# Patient Record
Sex: Male | Born: 1965 | Race: White | Hispanic: No | Marital: Single | Smoking: Former smoker
Health system: Southern US, Community
[De-identification: ages and names within clinical notes are randomized; demographics above are authoritative.]

## PROBLEM LIST (undated history)

## (undated) DIAGNOSIS — I517 Cardiomegaly: Secondary | ICD-10-CM

## (undated) DIAGNOSIS — C760 Malignant neoplasm of head, face and neck: Secondary | ICD-10-CM

## (undated) DIAGNOSIS — J45909 Unspecified asthma, uncomplicated: Secondary | ICD-10-CM

## (undated) DIAGNOSIS — B019 Varicella without complication: Secondary | ICD-10-CM

## (undated) HISTORY — PX: TONSILLECTOMY: SUR1361

## (undated) HISTORY — DX: Unspecified asthma, uncomplicated: J45.909

## (undated) HISTORY — DX: Varicella without complication: B01.9

---

## 1988-08-30 HISTORY — PX: RHINOPLASTY: SUR1284

## 2003-08-31 HISTORY — PX: OTHER SURGICAL HISTORY: SHX169

## 2006-07-24 ENCOUNTER — Inpatient Hospital Stay: Payer: Self-pay | Admitting: Surgery

## 2006-08-30 HISTORY — PX: APPENDECTOMY: SHX54

## 2012-06-22 DIAGNOSIS — J45909 Unspecified asthma, uncomplicated: Secondary | ICD-10-CM | POA: Insufficient documentation

## 2014-01-08 DIAGNOSIS — D509 Iron deficiency anemia, unspecified: Secondary | ICD-10-CM | POA: Insufficient documentation

## 2014-05-07 LAB — TESTOSTERONE: Testosterone: 409

## 2014-12-16 DIAGNOSIS — N400 Enlarged prostate without lower urinary tract symptoms: Secondary | ICD-10-CM | POA: Insufficient documentation

## 2014-12-31 ENCOUNTER — Other Ambulatory Visit: Payer: Self-pay

## 2015-01-07 ENCOUNTER — Ambulatory Visit: Payer: Self-pay

## 2015-01-07 DIAGNOSIS — E291 Testicular hypofunction: Secondary | ICD-10-CM | POA: Insufficient documentation

## 2015-01-07 LAB — HEPATIC FUNCTION PANEL
ALT: 34 U/L (ref 10–40)
AST: 35 U/L (ref 14–40)
Alkaline Phosphatase: 74 U/L (ref 25–125)
BILIRUBIN, TOTAL: 0.4 mg/dL

## 2015-01-07 LAB — BASIC METABOLIC PANEL
BUN: 13 mg/dL (ref 4–21)
Creatinine: 1 mg/dL (ref 0.6–1.3)
Glucose: 100 mg/dL
Potassium: 4.6 mmol/L (ref 3.4–5.3)
SODIUM: 138 mmol/L (ref 137–147)

## 2015-01-07 LAB — TSH: TSH: 2.04 u[IU]/mL (ref <=5.90)

## 2015-01-07 LAB — LIPID PANEL
Cholesterol: 196 mg/dL (ref 0–200)
HDL: 51 mg/dL (ref 35–70)
LDL CALC: 122 mg/dL
Triglycerides: 114 mg/dL (ref 40–160)

## 2015-01-07 LAB — CBC AND DIFFERENTIAL
HCT: 46 % (ref 41–53)
Hemoglobin: 16.2 g/dL (ref 13.5–17.5)
Neutrophils Absolute: 6 /uL
Platelets: 301 10*3/uL (ref 150–399)
WBC: 9.6 10^3/mL

## 2015-09-24 LAB — TESTOSTERONE: Testosterone: 143

## 2015-09-24 LAB — HEMATOCRIT: HCT: 44 %

## 2015-11-20 DIAGNOSIS — J45909 Unspecified asthma, uncomplicated: Secondary | ICD-10-CM

## 2015-11-20 DIAGNOSIS — E291 Testicular hypofunction: Secondary | ICD-10-CM

## 2015-11-20 DIAGNOSIS — D509 Iron deficiency anemia, unspecified: Secondary | ICD-10-CM

## 2015-12-19 LAB — TESTOSTERONE: Testosterone: 842

## 2015-12-31 ENCOUNTER — Telehealth: Payer: Self-pay

## 2015-12-31 NOTE — Telephone Encounter (Signed)
Called patient to reschedule his 01/07/16 appointment with Dr. Mable Fill. I SW with a gentleman that answered the phone but did not give his name. I asked him to have Philip Cordova call the clinic.

## 2016-01-05 NOTE — Telephone Encounter (Signed)
Called patient LVM about 5/10 reschedule appt.

## 2016-01-07 ENCOUNTER — Ambulatory Visit: Payer: Self-pay | Admitting: Internal Medicine

## 2016-01-13 ENCOUNTER — Other Ambulatory Visit: Payer: Self-pay

## 2016-01-15 ENCOUNTER — Ambulatory Visit: Payer: Self-pay

## 2016-01-20 ENCOUNTER — Ambulatory Visit: Payer: Self-pay

## 2016-03-30 ENCOUNTER — Ambulatory Visit: Payer: Self-pay | Admitting: Urology

## 2016-03-30 VITALS — BP 128/80 | HR 67 | Wt 205.0 lb

## 2016-03-30 DIAGNOSIS — R591 Generalized enlarged lymph nodes: Secondary | ICD-10-CM

## 2016-03-30 MED ORDER — AMOXICILLIN-POT CLAVULANATE 875-125 MG PO TABS: 1.0000 | ORAL_TABLET | Freq: Two times a day (BID) | ORAL | 0 refills | Status: DC

## 2016-03-30 MED ORDER — ACYCLOVIR 400 MG PO TABS: 400.0000 mg | ORAL_TABLET | ORAL | 0 refills | Status: DC | PRN

## 2016-03-30 NOTE — Progress Notes (Signed)
  Patient: Philip Cordova Male    DOB: 01-12-1966   50 y.o.   MRN: [id number] Visit Date: 03/30/2016  Today's Provider: [place] [facility]   Chief Complaint  Patient presents with  . Follow-up  . Adenopathy    noticed for a couple weeks   Subjective:    HPI Needs refill on acyclovir.        Allergies  Allergen Reactions  . Milk-Related Compounds    Previous Medications   ACYCLOVIR (ZOVIRAX) 400 MG TABLET    Take 400 mg by mouth as needed.   FLUTICASONE (FLONASE) 50 MCG/ACT NASAL SPRAY    Place 1 spray into both nostrils 2 (two) times daily.   FLUTICASONE-SALMETEROL (ADVAIR) 250-50 MCG/DOSE AEPB    Inhale 1 puff into the lungs 2 (two) times daily.    Review of Systems  Social History  Substance Use Topics  . Smoking status: Current Every Day Smoker    Years: 1.00  . Smokeless tobacco: Not on file  . Alcohol use No     Comment: Occasionally   Objective:   BP 128/80   Pulse 67   Wt 205 lb (93 kg)   SpO2 98%   Physical Exam      Assessment & Plan:    1. Lymphadenopathy  -start antibiotic amoxicillin  -order CT of neck in anticipation of node not resolving       [facility]

## 2016-04-07 ENCOUNTER — Other Ambulatory Visit: Payer: Self-pay

## 2016-04-09 DIAGNOSIS — C4442 Squamous cell carcinoma of skin of scalp and neck: Secondary | ICD-10-CM

## 2016-04-09 DIAGNOSIS — C760 Malignant neoplasm of head, face and neck: Secondary | ICD-10-CM

## 2016-04-09 HISTORY — DX: Squamous cell carcinoma of skin of scalp and neck: C44.42

## 2016-04-09 HISTORY — DX: Malignant neoplasm of head, face and neck: C76.0

## 2016-04-14 ENCOUNTER — Other Ambulatory Visit: Payer: Self-pay

## 2016-04-14 DIAGNOSIS — J45909 Unspecified asthma, uncomplicated: Secondary | ICD-10-CM

## 2016-04-15 LAB — CBC WITH DIFFERENTIAL/PLATELET
BASOS ABS: 0 10*3/uL (ref 0.0–0.2)
Basos: 1 %
EOS (ABSOLUTE): 0.2 10*3/uL (ref 0.0–0.4)
Eos: 4 %
HEMOGLOBIN: 15.4 g/dL (ref 12.6–17.7)
Hematocrit: 45.9 % (ref 37.5–51.0)
IMMATURE GRANS (ABS): 0 10*3/uL (ref 0.0–0.1)
Immature Granulocytes: 0 %
Lymphocytes Absolute: 1.8 10*3/uL (ref 0.7–3.1)
Lymphs: 33 %
MCH: 31.2 pg (ref 26.6–33.0)
MCHC: 33.6 g/dL (ref 31.5–35.7)
MCV: 93 fL (ref 79–97)
MONOCYTES: 10 %
Monocytes Absolute: 0.5 10*3/uL (ref 0.1–0.9)
NEUTROS ABS: 2.8 10*3/uL (ref 1.4–7.0)
Neutrophils: 52 %
Platelets: 327 10*3/uL (ref 150–379)
RBC: 4.93 x10E6/uL (ref 4.14–5.80)
RDW: 13.5 % (ref 12.3–15.4)
WBC: 5.4 10*3/uL (ref 3.4–10.8)

## 2016-04-15 LAB — COMPREHENSIVE METABOLIC PANEL
A/G RATIO: 1.5 (ref 1.2–2.2)
ALBUMIN: 4.3 g/dL (ref 3.5–5.5)
ALT: 32 IU/L (ref 0–44)
AST: 30 IU/L (ref 0–40)
Alkaline Phosphatase: 57 IU/L (ref 39–117)
BILIRUBIN TOTAL: 0.8 mg/dL (ref 0.0–1.2)
BUN / CREAT RATIO: 18 (ref 9–20)
BUN: 19 mg/dL (ref 6–24)
CALCIUM: 9.7 mg/dL (ref 8.7–10.2)
CHLORIDE: 99 mmol/L (ref 96–106)
CO2: 24 mmol/L (ref 18–29)
Creatinine, Ser: 1.06 mg/dL (ref 0.76–1.27)
GFR calc Af Amer: 95 mL/min/{1.73_m2} (ref >59)
GFR calc non Af Amer: 83 mL/min/{1.73_m2} (ref >59)
Globulin, Total: 2.8 g/dL (ref 1.5–4.5)
Glucose: 100 mg/dL — ABNORMAL HIGH (ref 65–99)
POTASSIUM: 4.5 mmol/L (ref 3.5–5.2)
Sodium: 140 mmol/L (ref 134–144)
Total Protein: 7.1 g/dL (ref 6.0–8.5)

## 2016-04-15 LAB — LIPID PANEL
CHOL/HDL RATIO: 3.7 ratio (ref 0.0–5.0)
CHOLESTEROL TOTAL: 145 mg/dL (ref 100–199)
HDL: 39 mg/dL — AB (ref >39)
LDL CALC: 95 mg/dL (ref 0–99)
TRIGLYCERIDES: 56 mg/dL (ref 0–149)
VLDL CHOLESTEROL CAL: 11 mg/dL (ref 5–40)

## 2016-04-15 LAB — HEMOGLOBIN A1C
Est. average glucose Bld gHb Est-mCnc: 108 mg/dL
Hgb A1c MFr Bld: 5.4 % (ref 4.8–5.6)

## 2016-04-15 LAB — TSH: TSH: 2.09 u[IU]/mL (ref 0.450–4.500)

## 2016-04-19 ENCOUNTER — Ambulatory Visit
Admission: RE | Admit: 2016-04-19 | Discharge: 2016-04-19 | Disposition: A | Discharge status: Home / Self Care | Payer: Self-pay | Source: Ambulatory Visit | Attending: Urology | Admitting: Urology

## 2016-04-19 DIAGNOSIS — R591 Generalized enlarged lymph nodes: Secondary | ICD-10-CM | POA: Insufficient documentation

## 2016-04-19 MED ORDER — IOPAMIDOL (ISOVUE-300) INJECTION 61%
75.0000 mL | Freq: Once | INTRAVENOUS | Status: AC | PRN
  Administered 2016-04-19: 75 mL via INTRAVENOUS

## 2016-04-20 ENCOUNTER — Other Ambulatory Visit: Payer: Self-pay | Admitting: Urology

## 2016-04-20 DIAGNOSIS — C799 Secondary malignant neoplasm of unspecified site: Secondary | ICD-10-CM

## 2016-04-20 NOTE — Progress Notes (Signed)
Patient with lymphadenopathy suspicious for metastatic disease needs to be seen at the cancer center.

## 2016-04-21 ENCOUNTER — Encounter: Payer: Self-pay | Admitting: Internal Medicine

## 2016-04-21 ENCOUNTER — Ambulatory Visit: Payer: Self-pay | Admitting: Internal Medicine

## 2016-04-21 VITALS — BP 112/70 | HR 67 | Temp 98.2°F | Wt 194.0 lb

## 2016-04-21 DIAGNOSIS — C8591 Non-Hodgkin lymphoma, unspecified, lymph nodes of head, face, and neck: Secondary | ICD-10-CM

## 2016-04-21 DIAGNOSIS — R591 Generalized enlarged lymph nodes: Secondary | ICD-10-CM

## 2016-04-21 NOTE — Progress Notes (Signed)
   Subjective:    Patient ID: Philip Cordova, male    DOB: Jul 20, 1966, 50 y.o.   MRN: [id number]  HPI   Pt. 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 CT scan of neck is abnormal. Pt. Reports no pain in neck.  Pt. Reports taking testosterone replacement injection. Takes lowest dose.   Patient Active Problem List   Diagnosis Date Noted  . 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 Microcytic hypochromic anemia 01/08/2014  . Asthma 06/22/2012     Medication List       Accurate as of 04/21/16  9:35 AM. Always use your most recent med list.          acyclovir 400 MG tablet Commonly known as:  ZOVIRAX Take 1 tablet (400 mg total) by mouth as needed.   amoxicillin-clavulanate 875-125 MG tablet Commonly known as:  AUGMENTIN Take 1 tablet by mouth 2 (two) times daily.   fluticasone 50 MCG/ACT nasal spray Commonly known as:  FLONASE Place 1 spray into both nostrils 2 (two) times daily.   Fluticasone-Salmeterol 250-50 MCG/DOSE Aepb Commonly known as:  ADVAIR Inhale 1 puff into the lungs 2 (two) times daily.        Review of Systems Lymph nodes in neck and inferior jaw    Objective:   Physical Exam  Constitutional: He is oriented to person, place, and time.  Cardiovascular: Normal rate, regular rhythm and normal heart sounds.   Pulmonary/Chest: Effort normal and breath sounds normal.  Neurological: He is alert and oriented to person, place, and time.    BP 112/70   Pulse 67   Temp 98.2 F (36.8 C)   Wt 194 lb (88 kg)        Assessment & Plan:   Referral to oncology at [facility] for a biopsy of lymph nodes

## 2016-04-21 NOTE — Patient Instructions (Signed)
Referral to oncology for biopsy of lymph nodes in posterior-superior left neck.

## 2016-04-27 ENCOUNTER — Inpatient Hospital Stay: Payer: Self-pay | Attending: Internal Medicine | Admitting: Internal Medicine

## 2016-04-27 ENCOUNTER — Telehealth: Payer: Self-pay | Admitting: *Deleted

## 2016-04-27 ENCOUNTER — Encounter: Payer: Self-pay | Admitting: Internal Medicine

## 2016-04-27 VITALS — BP 120/73 | HR 60 | Temp 96.8°F | Resp 18 | Ht 73.0 in | Wt 196.7 lb

## 2016-04-27 DIAGNOSIS — J45909 Unspecified asthma, uncomplicated: Secondary | ICD-10-CM | POA: Insufficient documentation

## 2016-04-27 DIAGNOSIS — F17200 Nicotine dependence, unspecified, uncomplicated: Secondary | ICD-10-CM | POA: Insufficient documentation

## 2016-04-27 DIAGNOSIS — R591 Generalized enlarged lymph nodes: Secondary | ICD-10-CM

## 2016-04-27 DIAGNOSIS — Z7982 Long term (current) use of aspirin: Secondary | ICD-10-CM | POA: Insufficient documentation

## 2016-04-27 DIAGNOSIS — R590 Localized enlarged lymph nodes: Secondary | ICD-10-CM | POA: Insufficient documentation

## 2016-04-27 DIAGNOSIS — F17210 Nicotine dependence, cigarettes, uncomplicated: Secondary | ICD-10-CM

## 2016-04-27 NOTE — Progress Notes (Signed)
[place] CONSULT NOTE  Patient Care Team: No Pcp Per Patient as PCP - General (General Practice)  CHIEF COMPLAINTS/PURPOSE OF CONSULTATION: Cervical adenopathy suspicious for metastases  # AUG 2017-  LEFT NECK/Submandibular LN- necrotic [~2-2.5cm]; Right neck [66mm]  # Low testosterone [on testosterone shots]   No history exists.     HISTORY OF PRESENTING ILLNESS:  Philip Cordova 50 y.o.  male with a history of low testosterone on testosterone injections with urology; noted to have swelling in the left side of the neck approximately 6 weeks ago. Slight improvement with antibiotics [? Ear infection]; but not completely resolved. No pain. No difficulty swallowing or pain with swallowing. Denies any sore throat.  Patient lost about 65 pounds- intentional over the last 1 year. He denies any loss of appetite otherwise no nausea no vomiting. No significant blood in stools.   ROS: A complete 10 point review of system is done which is negative except mentioned above in history of present illness  MEDICAL HISTORY:  Past Medical History:  Diagnosis Date  . Asthma     SURGICAL HISTORY: Past Surgical History:  Procedure Laterality Date  . APPENDECTOMY  2008  . Moles removed  2005  . 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  Lives in [place].   Social History   Social History  . Marital status: Single    Spouse name: N/A  . Number of children: N/A  . Years of education: N/A   Occupational History  . Not on file.   Social History Main Topics  . Smoking status: Current Every Day Smoker    Years: 1.00  . Smokeless tobacco: Former Systems developer  . Alcohol use No     Comment: Occasionally  . Drug use: No  . Sexual activity: Not on file   Other Topics Concern  . Not on file   Social History Narrative  . No narrative on file    FAMILY HISTORY:  Family History  Problem Relation Age of Onset  . Cancer Father      Skin  . Hyperlipidemia Father   . Hypertension Father     Possible  . Diabetes Paternal Uncle     ALLERGIES:  is allergic to milk-related compounds.  MEDICATIONS:  Current Outpatient Prescriptions  Medication Sig Dispense Refill  . acyclovir (ZOVIRAX) 400 MG tablet Take 1 tablet (400 mg total) by mouth as needed. 90 tablet 0  . albuterol (PROVENTIL HFA;VENTOLIN HFA) 108 (90 Base) MCG/ACT inhaler Inhale into the lungs.    Marland Kitchen aspirin EC 81 MG tablet Take 81 mg by mouth daily.    . calcium carbonate 100 mg/ml SUSP     . Ergocalciferol (VITAMIN D2 PO)     . fluticasone (FLONASE) 50 MCG/ACT nasal spray Place 1 spray into both nostrils 2 (two) times daily.    . Fluticasone-Salmeterol (ADVAIR) 250-50 MCG/DOSE AEPB Inhale 1 puff into the lungs 2 (two) times daily.    . Multiple Vitamins-Minerals (MULTIVITAMIN WITH MINERALS) tablet Take by mouth.    . Omega-3 Fatty Acids (FISH OIL) 1000 MG CAPS Take by mouth.    . testosterone cypionate (DEPOTESTOSTERONE CYPIONATE) 200 MG/ML injection INJECT 1 ML (200 MG) INTO THE MUSCLE EVERY 14 DAYS    . vitamin B-12 (CYANOCOBALAMIN) 100 MCG tablet Take by mouth.     No current facility-administered medications for this visit.       Marland Kitchen  PHYSICAL EXAMINATION: ECOG PERFORMANCE STATUS:  0 - Asymptomatic  Vitals:   04/27/16 0926  BP: 120/73  Pulse: 60  Resp: 18  Temp: (!) 96.8 F (36 C)   Filed Weights   04/27/16 0926  Weight: 196 lb 10.4 oz (89.2 kg)    GENERAL: Well-nourished well-developed; Alert, no distress and comfortable.   Alone.  EYES: no pallor or icterus OROPHARYNX: no thrush or ulceration; good dentition  NECK: supple, no masses felt LYMPH:  no palpable lymphadenopathy in the axillary or inguinal regions; appx ~2cm LN felt in left submandibular region.  LUNGS: clear to auscultation and  No wheeze or crackles HEART/CVS: regular rate & rhythm and no murmurs; No lower extremity edema ABDOMEN: abdomen soft, non-tender and normal  bowel sounds Musculoskeletal:no cyanosis of digits and no clubbing  PSYCH: alert & oriented x 3 with fluent speech NEURO: no focal motor/sensory deficits SKIN:  no rashes or significant lesions  LABORATORY DATA:  I have reviewed the data as listed Lab Results  Component Value Date   WBC 5.4 04/14/2016   HGB 16.2 01/07/2015   HCT 45.9 04/14/2016   MCV 93 04/14/2016   PLT 327 04/14/2016    Recent Labs  04/14/16 1010  NA 140  K 4.5  CL 99  CO2 24  GLUCOSE 100*  BUN 19  CREATININE 1.06  CALCIUM 9.7  GFRNONAA 83  GFRAA 95  PROT 7.1  ALBUMIN 4.3  AST 30  ALT 32  ALKPHOS 57  BILITOT 0.8    RADIOGRAPHIC STUDIES: I have personally reviewed the radiological images as listed and agreed with the findings in the report. Ct Soft Tissue Neck W Contrast  Result Date: 04/19/2016 CLINICAL DATA:  Left lymph node enlargement EXAM: CT NECK WITH CONTRAST TECHNIQUE: Multidetector CT imaging of the neck was performed using the standard protocol following the bolus administration of intravenous contrast. CONTRAST:  43mL ISOVUE-300 IOPAMIDOL (ISOVUE-300) INJECTION 61% COMPARISON:  None FINDINGS: Pharynx and larynx: No pharyngeal mass. Base of tongue normal. Tonsils not enlarged. Epiglottis and vocal cords normal. Normal airway. Salivary glands: Submandibular and parotid glands normal bilaterally. No mass or stone. Thyroid: Negative Lymph nodes: Left level 2 necrotic node measures 23 x 22 mm. Immediately inferiorly is a solid enhancing 7.4 mm node. Both of these are worrisome for malignancy. Additional posterior lymph nodes in the left are indeterminate measuring up to 5 mm. 7 mm right level 2 lymph node with a fatty hilum, probably benign. No definite pathologic nodes in the right neck. Vascular: Carotid artery and jugular vein patent bilaterally. Limited intracranial: Negative Visualized orbits: Negative Mastoids and visualized paranasal sinuses: Negative Skeleton: Negative Upper chest: Lung apices  clear. IMPRESSION: 23 x 22 mm necrotic left level 2 lymph node compatible with metastatic disease. Biopsy recommended. Additional lymph nodes in the left are indeterminate but possibly due to metastatic disease is well. No evidence of pharyngeal mass. Direct mucosal evaluation recommended. Electronically Signed   By: Franchot Gallo M.D.   On: 04/19/2016 08:43    ASSESSMENT & PLAN:   Cervical lymphadenopathy Left neck  submandibular lymph node ~ approximately 2 cm in size; appx 7 mm on right side; ? Etiology- suspicious for metastases based on imaging. No primary noted on the CT scan. Recommend ENT evaluation. Patient will need a biopsy- either of the cervical lymph node/and or any primary site if noted on the ENT evaluation.   # Also discussed with the patient that he will need further imaging like a PET scan.   # I have left  a message for Dr. Pryor Ochoa [patient had previously been evaluated different problem in the past] to discuss the above.  # Ph: [phone] cell; we will contact the patient once I speak to Dr. Pryor Ochoa.  # 45 minutes face-to-face with the patient discussing the above plan of care; more than 50% of time spent on prognosis/ natural history; counseling and coordination.    All questions were answered. The patient knows to call the clinic with any problems, questions or concerns.    Cammie Sickle, MD 04/27/2016 1:27 PM

## 2016-04-27 NOTE — Progress Notes (Signed)
Patient here today as new evaluation regarding metastatic disease,  Referred by Dr. Ernestine Conrad.

## 2016-04-27 NOTE — Telephone Encounter (Signed)
rn spoke with pt. Made aware that Dr. Darien Ramus office will be in touch with him to schedule a consult appointment. Most likey, Dr. Pryor Ochoa will plan for an fine needle biopsy.  Pt gave verbal understanding of the plan and stated that he will wait for ENT's phone call.

## 2016-04-27 NOTE — Assessment & Plan Note (Addendum)
Left neck  submandibular lymph node ~ approximately 2 cm in size; appx 7 mm on right side; ? Etiology- suspicious for metastases based on imaging. No primary noted on the CT scan. Recommend ENT evaluation. Patient will need a biopsy- either of the cervical lymph node/and or any primary site if noted on the ENT evaluation.   # Also discussed with the patient that he will need further imaging like a PET scan.   # I have left a message for Dr. Pryor Ochoa [patient had previously been evaluated different problem in the past] to discuss the above.  # Ph: [phone] cell; we will contact the patient once I speak to Dr. Pryor Ochoa.  # 45 minutes face-to-face with the patient discussing the above plan of care; more than 50% of time spent on prognosis/ natural history; counseling and coordination.

## 2016-05-10 ENCOUNTER — Other Ambulatory Visit: Payer: Self-pay | Admitting: Otolaryngology

## 2016-05-10 DIAGNOSIS — R221 Localized swelling, mass and lump, neck: Secondary | ICD-10-CM

## 2016-05-17 ENCOUNTER — Inpatient Hospital Stay: Payer: Self-pay | Admitting: Internal Medicine

## 2016-05-18 ENCOUNTER — Inpatient Hospital Stay: Payer: Self-pay | Attending: Internal Medicine | Admitting: Internal Medicine

## 2016-05-18 VITALS — BP 105/64 | HR 65 | Temp 98.2°F | Resp 16 | Ht 73.0 in | Wt 189.6 lb

## 2016-05-18 DIAGNOSIS — F17210 Nicotine dependence, cigarettes, uncomplicated: Secondary | ICD-10-CM | POA: Insufficient documentation

## 2016-05-18 DIAGNOSIS — F17200 Nicotine dependence, unspecified, uncomplicated: Secondary | ICD-10-CM

## 2016-05-18 DIAGNOSIS — C4442 Squamous cell carcinoma of skin of scalp and neck: Secondary | ICD-10-CM

## 2016-05-18 DIAGNOSIS — C760 Malignant neoplasm of head, face and neck: Secondary | ICD-10-CM

## 2016-05-18 DIAGNOSIS — Z8589 Personal history of malignant neoplasm of other organs and systems: Secondary | ICD-10-CM | POA: Insufficient documentation

## 2016-05-18 DIAGNOSIS — J45909 Unspecified asthma, uncomplicated: Secondary | ICD-10-CM | POA: Insufficient documentation

## 2016-05-18 DIAGNOSIS — F411 Generalized anxiety disorder: Secondary | ICD-10-CM

## 2016-05-18 DIAGNOSIS — Z79899 Other long term (current) drug therapy: Secondary | ICD-10-CM | POA: Insufficient documentation

## 2016-05-18 DIAGNOSIS — C770 Secondary and unspecified malignant neoplasm of lymph nodes of head, face and neck: Secondary | ICD-10-CM | POA: Insufficient documentation

## 2016-05-18 DIAGNOSIS — Z7982 Long term (current) use of aspirin: Secondary | ICD-10-CM | POA: Insufficient documentation

## 2016-05-18 DIAGNOSIS — C801 Malignant (primary) neoplasm, unspecified: Secondary | ICD-10-CM | POA: Insufficient documentation

## 2016-05-18 NOTE — Progress Notes (Signed)
[place] CONSULT NOTE  Patient Care Team: No Pcp Per Patient as PCP - General (General Practice)  CHIEF COMPLAINTS/PURPOSE OF CONSULTATION: Cervical adenopathy suspicious for metastases    Oncology History   # AUG 2017-SQUAMOUS CELL CA [s/p FNA]  LEFT NECK/Submandibular LN- necrotic [~2-2.5cm]; Right neck [7mm]  # Low testosterone [on testosterone shots]     Squamous cell carcinoma of head and neck ([place])   05/18/2016 Initial Diagnosis    Squamous cell carcinoma of head and neck (HCC)       HISTORY OF PRESENTING ILLNESS:  Philip Cordova 50 y.o.  male with newly diagnosed squamous cell carcinoma is here for follow-up. Patient had FNA done with Dr. Pryor Ochoa- of the left submandibular lymph node. He is awaiting a PET scan next week.   No difficulty swallowing or pain with swallowing. Denies any sore throat. He denies his neck lymph nodes getting any worse. In fact he states is getting smaller. Denies any hoarseness of voice.  ROS: A complete 10 point review of system is done which is negative except mentioned above in history of present illness  MEDICAL HISTORY:  Past Medical History:  Diagnosis Date  . Asthma     SURGICAL HISTORY: Past Surgical History:  Procedure Laterality Date  . APPENDECTOMY  2008  . Moles removed  2005  . 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  Lives in [place].   Social History   Social History  . Marital status: Single    Spouse name: N/A  . Number of children: N/A  . Years of education: N/A   Occupational History  . Not on file.   Social History Main Topics  . Smoking status: Current Every Day Smoker    Years: 1.00  . Smokeless tobacco: Former Systems developer  . Alcohol use No     Comment: Occasionally  . Drug use: No  . Sexual activity: Not on file   Other Topics Concern  . Not on file   Social History Narrative  . No narrative on file    FAMILY HISTORY:  Family  History  Problem Relation Age of Onset  . Cancer Father     Skin  . Hyperlipidemia Father   . Hypertension Father     Possible  . Diabetes Paternal Uncle     ALLERGIES:  is allergic to milk-related compounds.  MEDICATIONS:  Current Outpatient Prescriptions  Medication Sig Dispense Refill  . aspirin EC 81 MG tablet Take 81 mg by mouth daily.    . calcium carbonate 100 mg/ml SUSP     . Ergocalciferol (VITAMIN D2 PO)     . Fluticasone-Salmeterol (ADVAIR) 250-50 MCG/DOSE AEPB Inhale 1 puff into the lungs 2 (two) times daily.    . Multiple Vitamins-Minerals (MULTIVITAMIN WITH MINERALS) tablet Take by mouth.    . Omega-3 Fatty Acids (FISH OIL) 1000 MG CAPS Take by mouth.    . vitamin B-12 (CYANOCOBALAMIN) 100 MCG tablet Take by mouth.    . fluticasone (FLONASE) 50 MCG/ACT nasal spray Place 1 spray into both nostrils 2 (two) times daily.    Marland Kitchen testosterone cypionate (DEPOTESTOSTERONE CYPIONATE) 200 MG/ML injection INJECT 1 ML (200 MG) INTO THE MUSCLE EVERY 14 DAYS     No current facility-administered medications for this visit.       Marland Kitchen  PHYSICAL EXAMINATION: ECOG PERFORMANCE STATUS: 0 - Asymptomatic  Vitals:   05/18/16 1049  BP: 105/64  Pulse: 65  Resp: 16  Temp: 98.2 F (36.8 C)   Filed Weights   05/18/16 1049  Weight: 189 lb 9.5 oz (86 kg)    GENERAL: Well-nourished well-developed; Alert, no distress and comfortable.   Alone.  EYES: no pallor or icterus OROPHARYNX: no thrush or ulceration; good dentition  NECK: supple, no masses felt LYMPH:  no palpable lymphadenopathy in the axillary or inguinal regions; appx ~2cm LN felt in left submandibular region.  LUNGS: clear to auscultation and  No wheeze or crackles HEART/CVS: regular rate & rhythm and no murmurs; No lower extremity edema ABDOMEN: abdomen soft, non-tender and normal bowel sounds Musculoskeletal:no cyanosis of digits and no clubbing  PSYCH: alert & oriented x 3 with fluent speech NEURO: no focal  motor/sensory deficits SKIN:  no rashes or significant lesions  LABORATORY DATA:  I have reviewed the data as listed Lab Results  Component Value Date   WBC 5.4 04/14/2016   HGB 16.2 01/07/2015   HCT 45.9 04/14/2016   MCV 93 04/14/2016   PLT 327 04/14/2016    Recent Labs  04/14/16 1010  NA 140  K 4.5  CL 99  CO2 24  GLUCOSE 100*  BUN 19  CREATININE 1.06  CALCIUM 9.7  GFRNONAA 83  GFRAA 95  PROT 7.1  ALBUMIN 4.3  AST 30  ALT 32  ALKPHOS 57  BILITOT 0.8    RADIOGRAPHIC STUDIES: I have personally reviewed the radiological images as listed and agreed with the findings in the report. Ct Soft Tissue Neck W Contrast  Result Date: 04/19/2016 CLINICAL DATA:  Left lymph node enlargement EXAM: CT NECK WITH CONTRAST TECHNIQUE: Multidetector CT imaging of the neck was performed using the standard protocol following the bolus administration of intravenous contrast. CONTRAST:  65mL ISOVUE-300 IOPAMIDOL (ISOVUE-300) INJECTION 61% COMPARISON:  None FINDINGS: Pharynx and larynx: No pharyngeal mass. Base of tongue normal. Tonsils not enlarged. Epiglottis and vocal cords normal. Normal airway. Salivary glands: Submandibular and parotid glands normal bilaterally. No mass or stone. Thyroid: Negative Lymph nodes: Left level 2 necrotic node measures 23 x 22 mm. Immediately inferiorly is a solid enhancing 7.4 mm node. Both of these are worrisome for malignancy. Additional posterior lymph nodes in the left are indeterminate measuring up to 5 mm. 7 mm right level 2 lymph node with a fatty hilum, probably benign. No definite pathologic nodes in the right neck. Vascular: Carotid artery and jugular vein patent bilaterally. Limited intracranial: Negative Visualized orbits: Negative Mastoids and visualized paranasal sinuses: Negative Skeleton: Negative Upper chest: Lung apices clear. IMPRESSION: 23 x 22 mm necrotic left level 2 lymph node compatible with metastatic disease. Biopsy recommended. Additional  lymph nodes in the left are indeterminate but possibly due to metastatic disease is well. No evidence of pharyngeal mass. Direct mucosal evaluation recommended. Electronically Signed   By: Franchot Gallo M.D.   On: 04/19/2016 08:43    ASSESSMENT & PLAN:   Squamous cell carcinoma of head and neck (HCC) Squamous cell carcinoma- likely primary - head and neck possibly tonsillar/base of tongue. However the PET scan- for better delineation. Presently the PET scan he might require surgery versus definitive chemoradiation. Patient has appointment with radiation oncology the day after the PET scan/next week.  # Financial issues- awaiting financial assistance for proceeding with a PET scan.  # Discussed with Dr. Pryor Ochoa; patient follow-up with me few Days after the results of the PET scan.   All questions were answered. The patient knows to call the clinic with any problems,  questions or concerns.    Cammie Sickle, MD 05/18/2016 7:07 PM

## 2016-05-18 NOTE — Assessment & Plan Note (Signed)
Squamous cell carcinoma- likely primary - head and neck possibly tonsillar/base of tongue. However the PET scan- for better delineation. Presently the PET scan he might require surgery versus definitive chemoradiation. Patient has appointment with radiation oncology the day after the PET scan/next week.  # Financial issues- awaiting financial assistance for proceeding with a PET scan.  # Discussed with Dr. Pryor Ochoa; patient follow-up with me few Days after the results of the PET scan.

## 2016-05-26 ENCOUNTER — Encounter
Admission: RE | Admit: 2016-05-26 | Discharge: 2016-05-26 | Disposition: A | Discharge status: Home / Self Care | Payer: Self-pay | Source: Ambulatory Visit | Attending: Otolaryngology | Admitting: Otolaryngology

## 2016-05-26 DIAGNOSIS — R221 Localized swelling, mass and lump, neck: Secondary | ICD-10-CM | POA: Insufficient documentation

## 2016-05-26 LAB — GLUCOSE, CAPILLARY: Glucose-Capillary: 108 mg/dL — ABNORMAL HIGH (ref 65–99)

## 2016-05-26 MED ORDER — FLUDEOXYGLUCOSE F - 18 (FDG) INJECTION
12.1100 | Freq: Once | INTRAVENOUS | Status: AC | PRN
  Administered 2016-05-26: 12.11 via INTRAVENOUS

## 2016-05-27 ENCOUNTER — Ambulatory Visit
Admission: RE | Admit: 2016-05-27 | Discharge: 2016-05-27 | Disposition: A | Discharge status: Home / Self Care | Payer: Self-pay | Source: Ambulatory Visit | Attending: Radiation Oncology | Admitting: Radiation Oncology

## 2016-05-27 ENCOUNTER — Institutional Professional Consult (permissible substitution): Payer: Self-pay | Admitting: Radiation Oncology

## 2016-05-27 ENCOUNTER — Encounter: Payer: Self-pay | Admitting: Radiation Oncology

## 2016-05-27 VITALS — BP 128/79 | HR 65 | Temp 97.0°F | Resp 20 | Wt 195.9 lb

## 2016-05-27 DIAGNOSIS — Z9049 Acquired absence of other specified parts of digestive tract: Secondary | ICD-10-CM | POA: Insufficient documentation

## 2016-05-27 DIAGNOSIS — C760 Malignant neoplasm of head, face and neck: Secondary | ICD-10-CM

## 2016-05-27 DIAGNOSIS — Z79899 Other long term (current) drug therapy: Secondary | ICD-10-CM | POA: Insufficient documentation

## 2016-05-27 DIAGNOSIS — Z809 Family history of malignant neoplasm, unspecified: Secondary | ICD-10-CM | POA: Insufficient documentation

## 2016-05-27 DIAGNOSIS — Z7982 Long term (current) use of aspirin: Secondary | ICD-10-CM | POA: Insufficient documentation

## 2016-05-27 DIAGNOSIS — J45909 Unspecified asthma, uncomplicated: Secondary | ICD-10-CM | POA: Insufficient documentation

## 2016-05-27 DIAGNOSIS — F17210 Nicotine dependence, cigarettes, uncomplicated: Secondary | ICD-10-CM | POA: Insufficient documentation

## 2016-05-27 NOTE — Consult Note (Signed)
NEW PATIENT EVALUATION  Name: Philip Cordova  MRN: [id number]  Date:   05/27/2016     DOB: [birth year]   This 50 y.o. male patient presents to the clinic for initial evaluation of head and neck cancer of unknown primary.  REFERRING PHYSICIAN: Cammie Sickle, *  CHIEF COMPLAINT:  Chief Complaint  Patient presents with  . Cancer    Pt is here for initial consultation of head and neck cancer.      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   PREVIOUS INVESTIGATIONS:  PET CT and CT scans reviewed Pathology report requested for my review Clinical notes reviewed Case presented at weekly tumor conference  HPI: Patient is a 50 year old male who presented for several months with a history of left sub-digastric lymph node. Eventually came to the attention of ENT who performed a fine-needle aspiration showing squamous cell carcinoma consistent with head and neck origin. Patient had PET CT scan which showed hypermetabolic activity in the left sub-digastric node some asymmetry in the tonsillar region which may need further investigation. Patient is seen today for radiation oncology opinion. He is doing well. He is an avid athlete and biker specifically denies head and neck pain except for tenderness in his left sub-sub-digastric region and no dysphagia.  PLANNED TREATMENT REGIMEN: Concurrent chemoradiation  PAST MEDICAL HISTORY:  has a past medical history of Asthma.    PAST SURGICAL HISTORY:  Past Surgical History:  Procedure Laterality Date  . APPENDECTOMY  2008  . Moles removed  2005  . RHINOPLASTY  1990    FAMILY HISTORY: family history includes Cancer in his father; Diabetes in his paternal uncle; Hyperlipidemia in his father; Hypertension in his father.  SOCIAL HISTORY:  reports that he has been smoking.  He has smoked for the past 1.00 year. He has quit using smokeless tobacco. He reports that he does not drink alcohol or use  drugs.  ALLERGIES: Milk-related compounds  MEDICATIONS:  Current Outpatient Prescriptions  Medication Sig Dispense Refill  . aspirin EC 81 MG tablet Take 81 mg by mouth daily.    . calcium carbonate 100 mg/ml SUSP     . Ergocalciferol (VITAMIN D2 PO)     . fluticasone (FLONASE) 50 MCG/ACT nasal spray Place 1 spray into both nostrils 2 (two) times daily.    . Fluticasone-Salmeterol (ADVAIR) 250-50 MCG/DOSE AEPB Inhale 1 puff into the lungs 2 (two) times daily.    . Multiple Vitamins-Minerals (MULTIVITAMIN WITH MINERALS) tablet Take by mouth.    . Omega-3 Fatty Acids (FISH OIL) 1000 MG CAPS Take by mouth.    . testosterone cypionate (DEPOTESTOSTERONE CYPIONATE) 200 MG/ML injection INJECT 1 ML (200 MG) INTO THE MUSCLE EVERY 14 DAYS    . vitamin B-12 (CYANOCOBALAMIN) 100 MCG tablet Take by mouth.     No current facility-administered medications for this encounter.     ECOG PERFORMANCE STATUS:  0 - Asymptomatic  REVIEW OF SYSTEMS: Except for the pain associated with his left sub-digastric lymph node Patient denies any weight loss, fatigue, weakness, fever, chills or night sweats. Patient denies any loss of vision, blurred vision. Patient denies any ringing  of the ears or hearing loss. No irregular heartbeat. Patient denies heart murmur or history of fainting. Patient denies any chest pain or pain radiating to her upper extremities. Patient denies any shortness of breath, difficulty breathing at night, cough or hemoptysis. Patient denies any swelling in the lower legs. Patient denies any nausea vomiting,  vomiting of blood, or coffee ground material in the vomitus. Patient denies any stomach pain. Patient states has had normal bowel movements no significant constipation or diarrhea. Patient denies any dysuria, hematuria or significant nocturia. Patient denies any problems walking, swelling in the joints or loss of balance. Patient denies any skin changes, loss of hair or loss of weight. Patient  denies any excessive worrying or anxiety or significant depression. Patient denies any problems with insomnia. Patient denies excessive thirst, polyuria, polydipsia. Patient denies any swollen glands, patient denies easy bruising or easy bleeding. Patient denies any recent infections, allergies or URI. Patient "s visual fields have not changed significantly in recent time.    PHYSICAL EXAM: BP 128/79   Pulse 65   Temp 97 F (36.1 C)   Resp 20   Wt 195 lb 14.1 oz (88.9 kg)   BMI 25.84 kg/m  Oral cavity shows teeth in good state of repair. I do not see any asymmetry grossly in the tonsillar region. Indirect mirror examination shows upper airway clear vallecula and base of tongue within normal limits. Neck has a proximal me 2 cm left sub-digastric lymph node consistent with known history of squamous cell carcinoma. No other cervical or supraclavicular adenopathy is identified. Well-developed well-nourished patient in NAD. HEENT reveals PERLA, EOMI, discs not visualized.  Oral cavity is clear. No oral mucosal lesions are identified. Neck is clear without evidence of cervical or supraclavicular adenopathy. Lungs are clear to A&P. Cardiac examination is essentially unremarkable with regular rate and rhythm without murmur rub or thrill. Abdomen is benign with no organomegaly or masses noted. Motor sensory and DTR levels are equal and symmetric in the upper and lower extremities. Cranial nerves II through XII are grossly intact. Proprioception is intact. No peripheral adenopathy or edema is identified. No motor or sensory levels are noted. Crude visual fields are within normal range.  LABORATORY DATA: Pathology reports from ENT is requested for my review    RADIOLOGY RESULTS: PET CT scan reviewed   IMPRESSION: At this time locally advanced head and neck cancer of unknown primary with suspicious for left tonsillar primary in 50 year old male.  PLAN: At this time I discussed the case at our weekly tumor  conference with ENT. There may be an argument to go ahead with tonsillectomy to prove that this is a primary's area of squamous cell carcinoma origin. If no biopsies done with incorporate those areas in my treatment field and treat a high dose of 7000 cGy over 7 weeks treating the remainder of his neck nodes up to 5400 cGy using I MRT dose painting technique. Risks and benefits of treatment including alteration of taste xerostomia fatigue skin changes dysphasia all were described in detail to the patient and his mom. They both seem to comprehend my treatment plan well. I have tentatively set up CT simulation for next week..There will be extra effort by both professional staff as well as technical staff to coordinate and manage concurrent chemoradiation and ensuing side effects during his treatments.   I would like to take this opportunity to thank you for allowing me to participate in the care of your patient.Armstead Peaks., MD

## 2016-05-28 ENCOUNTER — Ambulatory Visit: Payer: Self-pay | Admitting: Radiation Oncology

## 2016-05-28 ENCOUNTER — Inpatient Hospital Stay (HOSPITAL_BASED_OUTPATIENT_CLINIC_OR_DEPARTMENT_OTHER): Payer: Self-pay | Admitting: Internal Medicine

## 2016-05-28 VITALS — BP 109/67 | HR 62 | Temp 96.7°F | Ht 73.0 in | Wt 195.0 lb

## 2016-05-28 DIAGNOSIS — C801 Malignant (primary) neoplasm, unspecified: Secondary | ICD-10-CM

## 2016-05-28 DIAGNOSIS — F17210 Nicotine dependence, cigarettes, uncomplicated: Secondary | ICD-10-CM

## 2016-05-28 DIAGNOSIS — C760 Malignant neoplasm of head, face and neck: Secondary | ICD-10-CM

## 2016-05-28 DIAGNOSIS — C770 Secondary and unspecified malignant neoplasm of lymph nodes of head, face and neck: Secondary | ICD-10-CM

## 2016-05-28 NOTE — Progress Notes (Signed)
START ON PATHWAY REGIMEN - Head and Neck  HNOS300: Cisplatin 100 mg/m2 q21 Days x 3 Cycles with Concurrent Radiation   A cycle is every 21 days:     Cisplatin (Platinol(R)) 100 mg/m2 in 500 mL NS IV over 2 hours.  **Prehydrate and consider post-hydration.**** Dose Mod: None  **Always confirm dose/schedule in your pharmacy ordering system**    Patient Characteristics: Oropharynx, Stage III, IVA, IVB, HPV Positive; Unresectable Disease Classification: Oropharynx HPV Status: Positive (+) Current Disease Status: No Distant Mets or Local Recurrence AJCC M Stage: 0 AJCC N Stage: 1 AJCC T Stage: 1 AJCC Stage Grouping: III  Intent of Therapy: Curative Intent, Discussed with Patient

## 2016-05-28 NOTE — Assessment & Plan Note (Addendum)
Squamous cell carcinoma- likely primary - head and neck possibly tonsillar/base of tongue. PET scan shows left neck lymph node uptake; and left more than right slight uptake on the PET scan/tonsillar asymmetry. Discussed with Dr. Vaught- plan bilateral tonsillar biopsy.  # Discussed with the patient the next treatment option would be definitive chemotherapy radiation therapy. Recommend cisplatin every [redacted] weeks along with radiation. Patient has met with radiation oncology.  # Discussed the potential side effects of chemotherapy including but not limited to fatigue kidney dysfunction; source in the mouth; drop in blood counts etc.   # Also discussed the possibility of offering TORS- at a higher Center; and getting radiation possibly along with chemotherapy on adjuvant basis. Patient is not too keen with this modality.  # Chemotherapy education week of October 16th; follow-up with me with cycle #1 of cisplatin. Check CBC CMP mag at the time. Discussed regarding IV access patient- wanting to avoid port and PICC line. Also discussed with Dr.Crystal.   # With regards to insurance issues- recommend social work evaluation/will prescribe nausea medications.  

## 2016-05-28 NOTE — Progress Notes (Signed)
[facility] CONSULT NOTE  Patient Care Team: No Pcp Per Patient as PCP - General (General Practice)    Oncology History   # AUG 2017-SQUAMOUS CELL CA [s/p FNA; Dr.Vaught]  LEFT NECK/Submandibular LN- necrotic [~2-2.5cm]; Right neck [42m]; SEP 22nd PET- left neck uptake; ? Tonsillar uptake L>R  # Low testosterone [on testosterone shots]     Squamous cell carcinoma of head and neck ([facility]   05/18/2016 Initial Diagnosis    Squamous cell carcinoma of head and neck ([facility])       HISTORY OF PRESENTING ILLNESS:  Philip Cordova 50y.o.  male with newly diagnosed squamous cell carcinoma is here for follow-up/ Post-PET scan.  He continues to feel well overall; continues to deny difficulty swallowing or pain with swallowing. Denies any sore throat. He denies his neck lymph nodes getting any worse. Denies any hoarseness of voice.  ROS: A complete 10 point review of system is done which is negative except mentioned above in history of present illness  MEDICAL HISTORY:  Past Medical History:  Diagnosis Date  . Asthma     SURGICAL HISTORY: Past Surgical History:  Procedure Laterality Date  . APPENDECTOMY  2008  . Moles removed  2005  . 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  [place]   Social History   Social History  . Marital status: Single    Spouse name: N/A  . Number of children: N/A  . Years of education: N/A   Occupational History  . Not on file.   Social History Main Topics  . Smoking status: Current Every Day Smoker    Years: 1.00  . Smokeless tobacco: Former [employer] developer . Alcohol use No     Comment: Occasionally  . Drug use: No  . Sexual activity: Not on file   Other Topics Concern  . Not on file   Social History Narrative  . No narrative on file    FAMILY HISTORY:  Family History  Problem Relation Age of Onset  . Cancer Father     Skin  . Hyperlipidemia Father   . Hypertension  Father     Possible  . Diabetes Paternal Uncle     ALLERGIES:  is allergic to milk-related compounds.  MEDICATIONS:  Current Outpatient Prescriptions  Medication Sig Dispense Refill  . aspirin EC 81 MG tablet Take 81 mg by mouth daily.    . calcium carbonate 100 mg/ml SUSP     . Ergocalciferol (VITAMIN D2 PO)     . fluticasone (FLONASE) 50 MCG/ACT nasal spray Place 1 spray into both nostrils 2 (two) times daily.    . Fluticasone-Salmeterol (ADVAIR) 250-50 MCG/DOSE AEPB Inhale 1 puff into the lungs 2 (two) times daily.    . Multiple Vitamins-Minerals (MULTIVITAMIN WITH MINERALS) tablet Take by mouth.    . Omega-3 Fatty Acids (FISH OIL) 1000 MG CAPS Take by mouth.    . vitamin B-12 (CYANOCOBALAMIN) 100 MCG tablet Take by mouth.     No current facility-administered medications for this visit.       .Marland Kitchen PHYSICAL EXAMINATION: ECOG PERFORMANCE STATUS: 0 - Asymptomatic  Vitals:   05/28/16 1123  BP: 109/67  Pulse: 62  Temp: (!) 96.7 F (35.9 C)   Filed Weights   05/28/16 1123  Weight: 195 lb (88.5 kg)    GENERAL: Well-nourished well-developed; Alert, no distress and comfortable.   Accompanied by his mother. EYES: no pallor  or icterus OROPHARYNX: no thrush or ulceration; good dentition  NECK: supple, no masses felt LYMPH:  no palpable lymphadenopathy in the axillary or inguinal regions; appx ~2cm LN felt in left submandibular region.  LUNGS: clear to auscultation and  No wheeze or crackles HEART/CVS: regular rate & rhythm and no murmurs; No lower extremity edema ABDOMEN: abdomen soft, non-tender and normal bowel sounds Musculoskeletal:no cyanosis of digits and no clubbing  PSYCH: alert & oriented x 3 with fluent speech NEURO: no focal motor/sensory deficits SKIN:  no rashes or significant lesions  LABORATORY DATA:  I have reviewed the data as listed Lab Results  Component Value Date   WBC 5.4 04/14/2016   HGB 16.2 01/07/2015   HCT 45.9 04/14/2016   MCV 93 04/14/2016    PLT 327 04/14/2016    Recent Labs  04/14/16 1010  NA 140  K 4.5  CL 99  CO2 24  GLUCOSE 100*  BUN 19  CREATININE 1.06  CALCIUM 9.7  GFRNONAA 83  GFRAA 95  PROT 7.1  ALBUMIN 4.3  AST 30  ALT 32  ALKPHOS 57  BILITOT 0.8    RADIOGRAPHIC STUDIES: I have personally reviewed the radiological images as listed and agreed with the findings in the report. Nm Pet Image Initial (pi) Skull Base To Thigh  Result Date: 05/26/2016 CLINICAL DATA:  Initial treatment strategy for neck mass. Neck biopsy 2 weeks prior. Squamous cell carcinoma EXAM: NUCLEAR MEDICINE PET SKULL BASE TO THIGH TECHNIQUE: 12.1 mCi F-18 FDG was injected intravenously. Full-ring PET imaging was performed from the skull base to thigh after the radiotracer. CT data was obtained and used for attenuation correction and anatomic localization. FASTING BLOOD GLUCOSE:  Value:  108 mg/dl COMPARISON:  Neck CT 04/19/2016 FINDINGS: NECK Hypermetabolic LEFT level 2 lymph node measures 18 mm short axis with SUV max equal 6.5. No additional hypermetabolic nodes in the neck. There is mild asymmetric activity within the hypopharynx favoring the LEFT lingual tonsil region (image 41, fused data set). No mass lesion identified on CT portion. Physiologic metabolic activity at the glottis. CHEST No hypermetabolic mediastinal or hilar nodes. No suspicious pulmonary nodules on the CT scan. ABDOMEN/PELVIS No abnormal hypermetabolic activity within the liver, pancreas, adrenal glands, or spleen. No hypermetabolic lymph nodes in the abdomen or pelvis. Prostate normal SKELETON No focal hypermetabolic activity to suggest skeletal metastasis. IMPRESSION: 1. Hypermetabolic LEFT level II nodal metastasis. 2. No additional metastatic cervical neck adenopathy by FDG PET imaging. 3. Bilateral lingual tonsil metabolic activity greater on the LEFT. 4. No evidence distant metastatic disease. Electronically Signed   By: Suzy Bouchard M.D.   On: 05/26/2016 14:49     ASSESSMENT & PLAN:   Squamous cell carcinoma of head and neck ([facility]CC) Squamous cell carcinoma- likely primary - head and neck possibly tonsillar/base of tongue. PET scan shows left neck lymph node uptake; and left more than right slight uptake on the PET scan/tonsillar asymmetry. Discussed with Dr. Pryor Ochoa- plan bilateral tonsillar biopsy.  # Discussed with the patient the next treatment option would be definitive chemotherapy radiation therapy. Recommend cisplatin every [redacted] weeks along with radiation. Patient has met with radiation oncology.  # Discussed the potential side effects of chemotherapy including but not limited to fatigue kidney dysfunction; source in the mouth; drop in blood counts etc.   # Also discussed the possibility of offering TORS- at a higher Center; and getting radiation possibly along with chemotherapy on adjuvant basis. Patient is not too keen with this modality.  #  Chemotherapy education week of October 16th; follow-up with me with cycle #1 of cisplatin. Check CBC CMP mag at the time. Discussed regarding IV access patient- wanting to avoid port and PICC line.   All questions were answered. The patient knows to call the clinic with any problems, questions or concerns.  Cammie Sickle, MD 05/28/2016 12:49 PM

## 2016-05-31 ENCOUNTER — Institutional Professional Consult (permissible substitution): Payer: Self-pay | Admitting: Radiation Oncology

## 2016-06-01 ENCOUNTER — Ambulatory Visit
Admission: RE | Admit: 2016-06-01 | Discharge: 2016-06-01 | Disposition: A | Discharge status: Home / Self Care | Payer: Self-pay | Source: Ambulatory Visit | Attending: Radiation Oncology | Admitting: Radiation Oncology

## 2016-06-04 ENCOUNTER — Telehealth: Payer: Self-pay | Admitting: *Deleted

## 2016-06-04 ENCOUNTER — Other Ambulatory Visit: Payer: Self-pay | Admitting: *Deleted

## 2016-06-04 DIAGNOSIS — C760 Malignant neoplasm of head, face and neck: Secondary | ICD-10-CM

## 2016-06-04 NOTE — Telephone Encounter (Signed)
-----   Message from Cephus Richer sent at 06/04/2016 11:45 AM EDT ----- Contact: [phone] Per pt he will be having surgery on 06-17-16. He want to cancel chemo for right now until he see what the result of the surgery are, he will keep chemo class as scheduled.  After surgery he said it possible he may not need chemo. Please call if you have any questions.Marland Kitchen

## 2016-06-08 ENCOUNTER — Ambulatory Visit: Payer: Self-pay

## 2016-06-09 ENCOUNTER — Inpatient Hospital Stay: Admission: RE | Admit: 2016-06-09 | Payer: Self-pay | Source: Ambulatory Visit

## 2016-06-09 ENCOUNTER — Ambulatory Visit: Payer: Self-pay

## 2016-06-10 ENCOUNTER — Ambulatory Visit: Payer: Self-pay

## 2016-06-10 ENCOUNTER — Encounter
Admission: RE | Admit: 2016-06-10 | Discharge: 2016-06-10 | Disposition: A | Discharge status: Home / Self Care | Payer: Self-pay | Source: Ambulatory Visit | Attending: Otolaryngology | Admitting: Otolaryngology

## 2016-06-10 HISTORY — DX: Cardiomegaly: I51.7

## 2016-06-10 NOTE — Patient Instructions (Addendum)
  Your procedure is scheduled on: 06-17-16 Report to Same Day Surgery 2nd floor medical mall To find out your arrival time please call [phone] between 1PM - 3PM on 06-16-16  Remember: Instructions that are not followed completely may result in serious medical risk, up to and including death, or upon the discretion of your surgeon and anesthesiologist your surgery may need to be rescheduled.    _x___ 1. Do not eat food or drink liquids after midnight. No gum chewing or hard candies.     __x__ 2. No Alcohol for 24 hours before or after surgery.   __x__3. No Smoking for 24 prior to surgery.   ____  4. Bring all medications with you on the day of surgery if instructed.    __x__ 5. Notify your doctor if there is any change in your medical condition     (cold, fever, infections).     Do not wear jewelry, make-up, hairpins, clips or nail polish.  Do not wear lotions, powders, or perfumes. You may wear deodorant.  Do not shave 48 hours prior to surgery. Men may shave face and neck.  Do not bring valuables to the hospital.    [facility] is not responsible for any belongings or valuables.               Contacts, dentures or bridgework may not be worn into surgery.  Leave your suitcase in the car. After surgery it may be brought to your room.  For patients admitted to the hospital, discharge time is determined by your treatment team.   Patients discharged the day of surgery will not be allowed to drive home.    Please read over the following fact sheets that you were given:   [facility] Preparing for Surgery and or MRSA Information   ____ Take these medicines the morning of surgery with A SIP OF WATER:    1. NONE  2.  3.  4.  5.  6.  ____Fleets enema or Magnesium Citrate as directed.   ____ Use CHG Soap or sage wipes as directed on instruction sheet   _X___ Use inhalers on the day of surgery and bring to hospital day of surgery-USE ADVAIR AND ALBUTEROL INHALER AND BRING  ALBUTEROL TO HOSPITAL  ____ Stop metformin 2 days prior to surgery    ____ Take 1/2 of usual insulin dose the night before surgery and none on the morning of  surgery.   _X___ Stop aspirin or coumadin, or plavix-PT STOPPED ASA 06-07-16  x__ Stop Anti-inflammatories such as Advil, Aleve, Ibuprofen, Motrin, Naproxen,          Naprosyn, Goodies powders or aspirin products. Ok to take Tylenol.   _X___ Stop supplements until after surgery-PT STOPPED FISH OIL 06-07-16-STOP B COMPLEX, MELATONIN AND PROTEIN POWDER NOW  ____ Bring C-Pap to the hospital.

## 2016-06-11 ENCOUNTER — Ambulatory Visit: Payer: Self-pay

## 2016-06-14 ENCOUNTER — Ambulatory Visit: Payer: Self-pay

## 2016-06-14 NOTE — Patient Instructions (Signed)
Cisplatin injection What is this medicine? CISPLATIN (SIS pla tin) is a chemotherapy drug. It targets fast dividing cells, like cancer cells, and causes these cells to die. This medicine is used to treat many types of cancer like bladder, ovarian, and testicular cancers. This medicine may be used for other purposes; ask your health care provider or pharmacist if you have questions. What should I tell my health care provider before I take this medicine? They need to know if you have any of these conditions: -blood disorders -hearing problems -kidney disease -recent or ongoing radiation therapy -an unusual or allergic reaction to cisplatin, carboplatin, other chemotherapy, other medicines, foods, dyes, or preservatives -pregnant or trying to get pregnant -breast-feeding How should I use this medicine? This drug is given as an infusion into a vein. It is administered in a hospital or clinic by a specially trained health care professional. Talk to your pediatrician regarding the use of this medicine in children. Special care may be needed. Overdosage: If you think you have taken too much of this medicine contact a poison control center or emergency room at once. NOTE: This medicine is only for you. Do not share this medicine with others. What if I miss a dose? It is important not to miss a dose. Call your doctor or health care professional if you are unable to keep an appointment. What may interact with this medicine? -dofetilide -foscarnet -medicines for seizures -medicines to increase blood counts like filgrastim, pegfilgrastim, sargramostim -probenecid -pyridoxine used with altretamine -rituximab -some antibiotics like amikacin, gentamicin, neomycin, polymyxin B, streptomycin, tobramycin -sulfinpyrazone -vaccines -zalcitabine Talk to your doctor or health care professional before taking any of these medicines: -acetaminophen -aspirin -ibuprofen -ketoprofen -naproxen This list may  not describe all possible interactions. Give your health care provider a list of all the medicines, herbs, non-prescription drugs, or dietary supplements you use. Also tell them if you smoke, drink alcohol, or use illegal drugs. Some items may interact with your medicine. What should I watch for while using this medicine? Your condition will be monitored carefully while you are receiving this medicine. You will need important blood work done while you are taking this medicine. This drug may make you feel generally unwell. This is not uncommon, as chemotherapy can affect healthy cells as well as cancer cells. Report any side effects. Continue your course of treatment even though you feel ill unless your doctor tells you to stop. In some cases, you may be given additional medicines to help with side effects. Follow all directions for their use. Call your doctor or health care professional for advice if you get a fever, chills or sore throat, or other symptoms of a cold or flu. Do not treat yourself. This drug decreases your body's ability to fight infections. Try to avoid being around people who are sick. This medicine may increase your risk to bruise or bleed. Call your doctor or health care professional if you notice any unusual bleeding. Be careful brushing and flossing your teeth or using a toothpick because you may get an infection or bleed more easily. If you have any dental work done, tell your dentist you are receiving this medicine. Avoid taking products that contain aspirin, acetaminophen, ibuprofen, naproxen, or ketoprofen unless instructed by your doctor. These medicines may hide a fever. Do not become pregnant while taking this medicine. Women should inform their doctor if they wish to become pregnant or think they might be pregnant. There is a potential for serious side effects to   an unborn child. Talk to your health care professional or pharmacist for more information. Do not breast-feed an  infant while taking this medicine. Drink fluids as directed while you are taking this medicine. This will help protect your kidneys. Call your doctor or health care professional if you get diarrhea. Do not treat yourself. What side effects may I notice from receiving this medicine? Side effects that you should report to your doctor or health care professional as soon as possible: -allergic reactions like skin rash, itching or hives, swelling of the face, lips, or tongue -signs of infection - fever or chills, cough, sore throat, pain or difficulty passing urine -signs of decreased platelets or bleeding - bruising, pinpoint red spots on the skin, black, tarry stools, nosebleeds -signs of decreased red blood cells - unusually weak or tired, fainting spells, lightheadedness -breathing problems -changes in hearing -gout pain -low blood counts - This drug may decrease the number of white blood cells, red blood cells and platelets. You may be at increased risk for infections and bleeding. -nausea and vomiting -pain, swelling, redness or irritation at the injection site -pain, tingling, numbness in the hands or feet -problems with balance, movement -trouble passing urine or change in the amount of urine Side effects that usually do not require medical attention (report to your doctor or health care professional if they continue or are bothersome): -changes in vision -loss of appetite -metallic taste in the mouth or changes in taste This list may not describe all possible side effects. Call your doctor for medical advice about side effects. You may report side effects to FDA at 1-800-FDA-1088. Where should I keep my medicine? This drug is given in a hospital or clinic and will not be stored at home. NOTE: This sheet is a summary. It may not cover all possible information. If you have questions about this medicine, talk to your doctor, pharmacist, or health care provider.    2016, Elsevier/Gold  Standard. (2007-11-21 14:40:54)  

## 2016-06-15 ENCOUNTER — Ambulatory Visit: Payer: Self-pay

## 2016-06-15 ENCOUNTER — Inpatient Hospital Stay: Payer: Self-pay | Attending: Internal Medicine

## 2016-06-15 DIAGNOSIS — A630 Anogenital (venereal) warts: Secondary | ICD-10-CM | POA: Insufficient documentation

## 2016-06-15 DIAGNOSIS — Z7982 Long term (current) use of aspirin: Secondary | ICD-10-CM | POA: Insufficient documentation

## 2016-06-15 DIAGNOSIS — Z79899 Other long term (current) drug therapy: Secondary | ICD-10-CM | POA: Insufficient documentation

## 2016-06-15 DIAGNOSIS — Z87891 Personal history of nicotine dependence: Secondary | ICD-10-CM | POA: Insufficient documentation

## 2016-06-15 DIAGNOSIS — J45909 Unspecified asthma, uncomplicated: Secondary | ICD-10-CM | POA: Insufficient documentation

## 2016-06-15 DIAGNOSIS — C099 Malignant neoplasm of tonsil, unspecified: Secondary | ICD-10-CM | POA: Insufficient documentation

## 2016-06-16 ENCOUNTER — Ambulatory Visit: Payer: Self-pay

## 2016-06-17 ENCOUNTER — Encounter: Payer: Self-pay | Admitting: Anesthesiology

## 2016-06-17 ENCOUNTER — Ambulatory Visit: Payer: Self-pay

## 2016-06-17 ENCOUNTER — Ambulatory Visit: Payer: Self-pay | Admitting: Certified Registered Nurse Anesthetist

## 2016-06-17 ENCOUNTER — Ambulatory Visit
Admission: RE | Admit: 2016-06-17 | Discharge: 2016-06-17 | Disposition: A | Discharge status: Home / Self Care | Payer: Self-pay | Source: Ambulatory Visit | Attending: Otolaryngology | Admitting: Otolaryngology

## 2016-06-17 ENCOUNTER — Encounter
Admission: RE | Disposition: A | Discharge status: Home / Self Care | Payer: Self-pay | Source: Ambulatory Visit | Attending: Otolaryngology

## 2016-06-17 DIAGNOSIS — C099 Malignant neoplasm of tonsil, unspecified: Secondary | ICD-10-CM | POA: Insufficient documentation

## 2016-06-17 DIAGNOSIS — Z87891 Personal history of nicotine dependence: Secondary | ICD-10-CM | POA: Insufficient documentation

## 2016-06-17 DIAGNOSIS — C770 Secondary and unspecified malignant neoplasm of lymph nodes of head, face and neck: Secondary | ICD-10-CM | POA: Insufficient documentation

## 2016-06-17 HISTORY — PX: TONSILLECTOMY: SHX5217

## 2016-06-17 HISTORY — PX: DIAGNOSTIC LARYNGOSCOPY: SHX5368

## 2016-06-17 SURGERY — TONSILLECTOMY
Anesthesia: General

## 2016-06-17 MED ORDER — MIDAZOLAM HCL 2 MG/2ML IJ SOLN
INTRAMUSCULAR | Status: DC | PRN
  Administered 2016-06-17: 2 mg via INTRAVENOUS

## 2016-06-17 MED ORDER — ONDANSETRON HCL 4 MG/2ML IJ SOLN
INTRAMUSCULAR | Status: DC | PRN
  Administered 2016-06-17: 4 mg via INTRAVENOUS

## 2016-06-17 MED ORDER — OXYCODONE HCL 5 MG/5ML PO SOLN
ORAL | Status: AC
  Administered 2016-06-17: 5 mg via ORAL
  Filled 2016-06-17: qty 5

## 2016-06-17 MED ORDER — FENTANYL CITRATE (PF) 100 MCG/2ML IJ SOLN
INTRAMUSCULAR | Status: AC
  Administered 2016-06-17: 50 ug via INTRAVENOUS
  Filled 2016-06-17: qty 2

## 2016-06-17 MED ORDER — LIDOCAINE HCL (PF) 4 % IJ SOLN
INTRAMUSCULAR | Status: AC
  Filled 2016-06-17: qty 5

## 2016-06-17 MED ORDER — OXYCODONE HCL 5 MG PO TABS: 5.0000 mg | ORAL_TABLET | Freq: Once | ORAL | Status: AC | PRN

## 2016-06-17 MED ORDER — LACTATED RINGERS IV SOLN
INTRAVENOUS | Status: DC
  Administered 2016-06-17: 08:00:00 via INTRAVENOUS

## 2016-06-17 MED ORDER — EPINEPHRINE 30 MG/30ML IJ SOLN
INTRAMUSCULAR | Status: AC
  Filled 2016-06-17: qty 1

## 2016-06-17 MED ORDER — DOCUSATE SODIUM 100 MG PO CAPS: 100.0000 mg | ORAL_CAPSULE | Freq: Two times a day (BID) | ORAL | 0 refills | Status: DC

## 2016-06-17 MED ORDER — EPINEPHRINE PF 1 MG/ML IJ SOLN
INTRAMUSCULAR | Status: AC
  Filled 2016-06-17: qty 1

## 2016-06-17 MED ORDER — FENTANYL CITRATE (PF) 100 MCG/2ML IJ SOLN
25.0000 ug | INTRAMUSCULAR | Status: DC | PRN
  Administered 2016-06-17 (×2): 50 ug via INTRAVENOUS

## 2016-06-17 MED ORDER — BUPIVACAINE HCL (PF) 0.5 % IJ SOLN
INTRAMUSCULAR | Status: AC
  Filled 2016-06-17: qty 30

## 2016-06-17 MED ORDER — OXYMETAZOLINE HCL 0.05 % NA SOLN
NASAL | Status: AC
  Filled 2016-06-17: qty 15

## 2016-06-17 MED ORDER — SUCCINYLCHOLINE CHLORIDE 20 MG/ML IJ SOLN
INTRAMUSCULAR | Status: DC | PRN
  Administered 2016-06-17: 100 mg via INTRAVENOUS

## 2016-06-17 MED ORDER — BUPIVACAINE HCL 0.5 % IJ SOLN
INTRAMUSCULAR | Status: DC | PRN
  Administered 2016-06-17: 1.5 mL

## 2016-06-17 MED ORDER — PROPOFOL 10 MG/ML IV BOLUS
INTRAVENOUS | Status: DC | PRN
  Administered 2016-06-17: 30 mg via INTRAVENOUS
  Administered 2016-06-17: 170 mg via INTRAVENOUS

## 2016-06-17 MED ORDER — OXYCODONE HCL 5 MG/5ML PO SOLN
5.0000 mg | Freq: Once | ORAL | Status: AC | PRN
Start: 2016-06-17 — End: 2016-06-17
  Administered 2016-06-17: 5 mg via ORAL

## 2016-06-17 MED ORDER — ROCURONIUM BROMIDE 100 MG/10ML IV SOLN
INTRAVENOUS | Status: DC | PRN
  Administered 2016-06-17: 10 mg via INTRAVENOUS
  Administered 2016-06-17: 20 mg via INTRAVENOUS

## 2016-06-17 MED ORDER — OXYCODONE HCL 5 MG/5ML PO SOLN: 10.0000 mg | ORAL | 0 refills | Status: DC | PRN

## 2016-06-17 MED ORDER — FENTANYL CITRATE (PF) 100 MCG/2ML IJ SOLN
INTRAMUSCULAR | Status: DC | PRN
  Administered 2016-06-17: 100 ug via INTRAVENOUS
  Administered 2016-06-17 (×2): 50 ug via INTRAVENOUS

## 2016-06-17 MED ORDER — ACETAMINOPHEN 10 MG/ML IV SOLN
INTRAVENOUS | Status: DC | PRN
  Administered 2016-06-17: 1000 mg via INTRAVENOUS

## 2016-06-17 MED ORDER — ACETAMINOPHEN 10 MG/ML IV SOLN
INTRAVENOUS | Status: AC
Start: 2016-06-17 — End: 2016-06-17
  Filled 2016-06-17: qty 100

## 2016-06-17 MED ORDER — PROMETHAZINE HCL 12.5 MG PO TABS: 12.5000 mg | ORAL_TABLET | Freq: Four times a day (QID) | ORAL | 0 refills | Status: DC | PRN

## 2016-06-17 MED ORDER — FAMOTIDINE 20 MG PO TABS
ORAL_TABLET | ORAL | Status: AC
  Administered 2016-06-17: 20 mg via ORAL
  Filled 2016-06-17: qty 1

## 2016-06-17 MED ORDER — LIDOCAINE HCL (CARDIAC) 20 MG/ML IV SOLN
INTRAVENOUS | Status: DC | PRN
  Administered 2016-06-17: 100 mg via INTRAVENOUS

## 2016-06-17 MED ORDER — SUGAMMADEX SODIUM 200 MG/2ML IV SOLN
INTRAVENOUS | Status: DC | PRN
  Administered 2016-06-17: 176 mg via INTRAVENOUS

## 2016-06-17 MED ORDER — LIDOCAINE VISCOUS 2 % MT SOLN: 10.0000 mL | Freq: Four times a day (QID) | OROMUCOSAL | 0 refills | Status: DC | PRN

## 2016-06-17 MED ORDER — DEXAMETHASONE SODIUM PHOSPHATE 10 MG/ML IJ SOLN
INTRAMUSCULAR | Status: DC | PRN
  Administered 2016-06-17: 10 mg via INTRAVENOUS

## 2016-06-17 MED ORDER — FAMOTIDINE 20 MG PO TABS
20.0000 mg | ORAL_TABLET | Freq: Once | ORAL | Status: AC
  Administered 2016-06-17: 20 mg via ORAL

## 2016-06-17 SURGICAL SUPPLY — 34 items
ATOMIZER TRACHEAL (MISCELLANEOUS) IMPLANT
BANDAGE EYE OVAL (MISCELLANEOUS) IMPLANT
BASIN GRAD PLASTIC 32OZ STRL (MISCELLANEOUS) ×3 IMPLANT
BLADE BOVIE TIP EXT 4 (BLADE) ×3 IMPLANT
CANISTER SUCT 1200ML W/VALVE (MISCELLANEOUS) ×3 IMPLANT
CATH ROBINSON RED A/P 10FR (CATHETERS) IMPLANT
CATH ROBINSON RED A/P 14FR (CATHETERS) ×3 IMPLANT
COAG SUCT 10F 3.5MM HAND CTRL (MISCELLANEOUS) ×3 IMPLANT
CUP MEDICINE 2OZ PLAST GRAD ST (MISCELLANEOUS) ×9 IMPLANT
DRAPE TABLE BACK 80X90 (DRAPES) ×3 IMPLANT
DRESSING TELFA 4X3 1S ST N-ADH (GAUZE/BANDAGES/DRESSINGS) ×3 IMPLANT
ELECT REM PT RETURN 9FT ADLT (ELECTROSURGICAL) ×3
ELECTRODE REM PT RTRN 9FT ADLT (ELECTROSURGICAL) ×2 IMPLANT
GLOVE BIO SURGEON STRL SZ7.5 (GLOVE) ×3 IMPLANT
GOWN STRL REUS W/ TWL LRG LVL3 (GOWN DISPOSABLE) IMPLANT
GOWN STRL REUS W/TWL LRG LVL3 (GOWN DISPOSABLE)
HANDLE SUCTION POOLE (INSTRUMENTS) ×2 IMPLANT
KIT RM TURNOVER STRD PROC AR (KITS) ×3 IMPLANT
LABEL OR SOLS (LABEL) IMPLANT
MARKER SKIN DUAL TIP RULER LAB (MISCELLANEOUS) IMPLANT
NDL SAFETY 18GX1.5 (NEEDLE) ×3 IMPLANT
NEEDLE FILTER BLUNT 18X 1/2SAF (NEEDLE) ×2
NEEDLE FILTER BLUNT 18X1 1/2 (NEEDLE) ×4 IMPLANT
NS IRRIG 500ML POUR BTL (IV SOLUTION) ×3 IMPLANT
PACK HEAD/NECK (MISCELLANEOUS) ×3 IMPLANT
PATTIES SURGICAL .5 X.5 (GAUZE/BANDAGES/DRESSINGS) ×3 IMPLANT
SOL ANTI-FOG 6CC FOG-OUT (MISCELLANEOUS) IMPLANT
SOL FOG-OUT ANTI-FOG 6CC (MISCELLANEOUS)
SPONGE XRAY 4X4 16PLY STRL (MISCELLANEOUS) IMPLANT
SUCTION POOLE HANDLE (INSTRUMENTS) ×3
SYR 3ML LL SCALE MARK (SYRINGE) ×6 IMPLANT
SYRINGE 10CC LL (SYRINGE) ×3 IMPLANT
TOWEL OR 17X26 4PK STRL BLUE (TOWEL DISPOSABLE) ×3 IMPLANT
TUBING CONNECTING 10 (TUBING) ×3 IMPLANT

## 2016-06-17 NOTE — Transfer of Care (Signed)
Immediate Anesthesia Transfer of Care Note  Patient: Philip Cordova  Procedure(s) Performed: Procedure(s): TONSILLECTOMY (N/A) DIAGNOSTIC LARYNGOSCOPY WITH BIOPSIES  Patient Location: PACU  Anesthesia Type:General  Level of Consciousness: awake and alert  Airway & Oxygen Therapy: Patient Spontanous Breathing and Patient connected to face mask oxygen  Post-op Assessment: Report given to RN and Post -op Vital signs reviewed and stable  Post vital signs: Reviewed and stable  Last Vitals:  Vitals:   06/17/16 0738  BP: 116/67  Pulse: 76  Resp: 20  Temp: 36.4 C    Last Pain:  Vitals:   06/17/16 0738  TempSrc: Oral  PainSc: 0-No pain         Complications: No apparent anesthesia complications

## 2016-06-17 NOTE — Op Note (Signed)
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  Negative frozen section of left inferior palatine tonsil and left lingual tonsil/base of tongue.  Bilateral tonsillectomy and biopsies of base of tongue and nasopharynx performed for SCCA of neck with unknown primary given negative frozen sections of area on increased SUV on PET.  Procedure: After the patient was identified in holding and the history and physical and consent was reviewed, the patient was taken to the operating room and placed in a supine position.  General endotracheal anesthesia was induced in the normal fashion.  At this time, the patient was rotated 45 degrees and a shoulder roll was placed.  At this time, a mouth piece was placed within the patient's oral cavity for protection of teeth and gums.  Evaluation of the patient's oral cavity, floor of mouth, tongue and palatine tonsils was made with a Dedo laryngoscope.  Evaluation of the patient's piriform sinuses, larynx was made as well as base of tongue and epiglottis.  A whitish firm area to palpation was noted near his left inferior tonsil/lingual tonsil along the glosso-tonsillar fold.  Multiple biopsies were taken from this area and sent for frozen section.  This demonstrated inflammation but no obvious carcinoma.  Due to negative frozen sections in area of increased SUV on PET as well as no other obvious sites of primary, decision was made to remove patient's  bilateral tonsils as well as biopsy nasopharynx and base of tongue.    At this time, a McIvor mouthgag was inserted into the patient's oral cavity and suspended from the [place] stand without injury to teeth, lips, or gums.  Next a red rubber catheter was inserted into the patient left nostril for retraction of the uvula and soft palate superiorly.  Next a curved Alice clamp was attached to the patient's right superior tonsillar pole and retracted medially and inferiorly.  A Bovie electrocautery was used to dissect the patient's right tonsil in a subcapsular plane.  Meticulous hemostasis was achieved with Bovie suction cautery.  At this time, the mouth gag was released from suspension for 1 minute.  Attention now was directed to the patient's left side.  In a similar fashion the curved Alice clamp was attached to the superior pole and this was retracted medially and inferiorly and the tonsil was excised in a subcapsular plane with Bovie electrocautery.  After completion of the second tonsil, meticulous hemostasis was continued.  At this time, attention was directed to the patient's Nasopharynx.  Under indirect visualization using an operating mirror, using a Elburn forceps,nasopharyngeal tissue was biopsied bilaterally.  Meticulous hemostasis was continued.  At this time, the patient's nasal cavity and oral cavity was irrigated with sterile saline.  1.42mL 0.5% Marcaine was injected into the anterior and posterior tonsillar fossa bilaterally.  Following this  The care of patient was returned to anesthesia, awakened, and transferred to recovery in stable condition.  Dispo:  PACU to home  Plan: Soft diet.  Limit exercise and strenuous activity for 2 weeks.  Fluid hydration  Follow pathology.   Manjot Hinks 9:44 AM 06/17/2016

## 2016-06-17 NOTE — H&P (Signed)
..  History and Physical paper copy reviewed and updated date of procedure and will be scanned into system.  

## 2016-06-17 NOTE — Anesthesia Procedure Notes (Signed)
Procedure Name: Intubation Performed by: Demetrius Charity Pre-anesthesia Checklist: Patient identified, Patient being monitored, Timeout performed, Emergency Drugs available and Suction available Patient Re-evaluated:Patient Re-evaluated prior to inductionOxygen Delivery Method: Circle system utilized Preoxygenation: Pre-oxygenation with 100% oxygen Intubation Type: IV induction Ventilation: Mask ventilation without difficulty Laryngoscope Size: Mac and 3 Grade View: Grade I Tube type: Oral Rae Tube size: 7.5 mm Number of attempts: 1 Airway Equipment and Method: Stylet Placement Confirmation: ETT inserted through vocal cords under direct vision,  positive ETCO2 and breath sounds checked- equal and bilateral Secured at: 22 cm Tube secured with: Tape Dental Injury: Teeth and Oropharynx as per pre-operative assessment

## 2016-06-17 NOTE — Discharge Instructions (Signed)
AMBULATORY SURGERY  DISCHARGE INSTRUCTIONS   1) The drugs that you were given will stay in your system until tomorrow so for the next 24 hours you should not:  A) Drive an automobile B) Make any legal decisions C) Drink any alcoholic beverage   2) You may resume regular meals tomorrow.  Today it is better to start with liquids and gradually work up to solid foods.  You may eat anything you prefer, but it is better to start with liquids, then soup and crackers, and gradually work up to solid foods.   3) Please notify your doctor immediately if you have any unusual bleeding, trouble breathing, redness and pain at the surgery site, drainage, fever, or pain not relieved by medication.    4) Additional Instructions:        Please contact your physician with any problems or Same Day Surgery at [phone], Monday through Friday 6 am to 4 pm, or [place] at [facility] number at [phone].   Tonsillectomy, Adult, Care After Refer to this sheet in the next few weeks. These instructions provide you with information on caring for yourself after your procedure. Your health care provider may also give you more specific instructions. Your treatment has been planned according to current medical practices, but problems sometimes occur. Call your health care provider if you have any problems or questions after your procedure. WHAT TO EXPECT AFTER THE PROCEDURE After your procedure, it is typical to have the following:  Your tongue will be numb, and your sense of taste will be reduced.  Your swallowing will be difficult and painful.  Your jaw may hurt or make a clicking noise when you yawn or chew.  Liquids that you drink may leak out of your nose.  Your voice may sound muffled.  The area at the middle of the roof of your mouth (uvula) may be very swollen.  You may have a constant cough and need to clear mucus and phlegm from your throat. HOME CARE INSTRUCTIONS   Get proper  rest, keeping your head elevated at all times.  Drink plenty of fluids. This reduces pain and hastens the healing process.  Take medicines only as directed by your health care provider.  Soft and cold foods, such as gelatin, sherbet, ice cream, frozen ice pops, and cold drinks, are usually the easiest to eat. Several days after surgery, you will be able to eat more solid food.  Avoid mouthwashes and gargles.  Avoid contact with people who have upper respiratory infections, such as colds and sore throats. SEEK MEDICAL CARE IF:   You have increasing pain that is not controlled with medicines.  You have a fever.  You have a rash.  You feel light-headed or faint.  You are unable to swallow even small amounts of liquid or saliva.  Your urine is becoming very dark. SEEK IMMEDIATE MEDICAL CARE IF:   You have difficulty breathing.  You experience side effects or allergic reactions to medicines.  You bleed bright red blood from your throat, or you vomit bright red blood.   This information is not intended to replace advice given to you by your health care provider. Make sure you discuss any questions you have with your health care provider.   Document Released: 06/16/2004 Document Revised: 12/31/2014 Document Reviewed: 03/13/2013 Elsevier Interactive Patient Education [employer].

## 2016-06-17 NOTE — Anesthesia Postprocedure Evaluation (Signed)
Anesthesia Post Note  Patient: Sales promotion account executive  Procedure(s) Performed: Procedure(s) (LRB): TONSILLECTOMY (N/A) DIAGNOSTIC LARYNGOSCOPY WITH BIOPSIES  Patient location during evaluation: PACU Anesthesia Type: General Level of consciousness: awake and alert Pain management: pain level controlled Vital Signs Assessment: post-procedure vital signs reviewed and stable Respiratory status: spontaneous breathing, nonlabored ventilation, respiratory function stable and patient connected to nasal cannula oxygen Cardiovascular status: blood pressure returned to baseline and stable Postop Assessment: no signs of nausea or vomiting Anesthetic complications: no    Last Vitals:  Vitals:   06/17/16 1048 06/17/16 1131  BP: 133/87 126/82  Pulse: (!) 58 (!) 59  Resp: 20 20  Temp: 36.5 C     Last Pain:  Vitals:   06/17/16 1131  TempSrc:   PainSc: 6                  Jenavee Laguardia K Courney Garrod

## 2016-06-17 NOTE — Anesthesia Preprocedure Evaluation (Signed)
Anesthesia Evaluation  Patient identified by MRN, date of birth, ID band Patient awake    Reviewed: Allergy & Precautions, H&P , NPO status , Patient's Chart, lab work & pertinent test results  History of Anesthesia Complications Negative for: history of anesthetic complications  Airway Mallampati: II  TM Distance: >3 FB Neck ROM: full    Dental no notable dental hx. (+) Teeth Intact   Pulmonary neg shortness of breath, asthma , former smoker,    Pulmonary exam normal breath sounds clear to auscultation       Cardiovascular Exercise Tolerance: Good (-) angina(-) Past MI and (-) DOE negative cardio ROS Normal cardiovascular exam Rhythm:regular Rate:Normal     Neuro/Psych negative neurological ROS  negative psych ROS   GI/Hepatic negative GI ROS, Neg liver ROS, neg GERD  ,  Endo/Other  negative endocrine ROS  Renal/GU      Musculoskeletal   Abdominal   Peds  Hematology negative hematology ROS (+)   Anesthesia Other Findings Past Medical History: No date: Asthma 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     Comment: STATES HR STAYS AROUND 48 No date: Cancer [facility])  Past Surgical History: 2008: APPENDECTOMY 2005: Moles removed 1990: RHINOPLASTY     Reproductive/Obstetrics negative OB ROS                             Anesthesia Physical Anesthesia Plan  ASA: III  Anesthesia Plan: General ETT   Post-op Pain Management:    Induction:   Airway Management Planned:   Additional Equipment:   Intra-op Plan:   Post-operative Plan:   Informed Consent: I have reviewed the patients History and Physical, chart, labs and discussed the procedure including the risks, benefits and alternatives for the proposed anesthesia with the patient or authorized representative who has indicated his/her understanding and acceptance.     Plan Discussed with: Anesthesiologist, CRNA and Surgeon  Anesthesia Plan  Comments:         Anesthesia Quick Evaluation

## 2016-06-18 ENCOUNTER — Ambulatory Visit: Payer: Self-pay

## 2016-06-18 ENCOUNTER — Ambulatory Visit: Payer: Self-pay | Admitting: Internal Medicine

## 2016-06-18 ENCOUNTER — Other Ambulatory Visit: Payer: Self-pay

## 2016-06-21 ENCOUNTER — Ambulatory Visit: Payer: Self-pay

## 2016-06-21 LAB — SURGICAL PATHOLOGY

## 2016-06-22 ENCOUNTER — Ambulatory Visit: Payer: Self-pay

## 2016-06-22 NOTE — Progress Notes (Unsigned)
Due to diagnosis, patient is unable to work.  Patient is in need of assistance with his monthly expenses.  The [place] and [employer] are providing assistance for these needs.

## 2016-06-23 ENCOUNTER — Encounter: Payer: Self-pay | Admitting: Radiation Oncology

## 2016-06-23 ENCOUNTER — Ambulatory Visit
Admission: RE | Admit: 2016-06-23 | Discharge: 2016-06-23 | Disposition: A | Discharge status: Home / Self Care | Payer: Self-pay | Source: Ambulatory Visit | Attending: Radiation Oncology | Admitting: Radiation Oncology

## 2016-06-23 ENCOUNTER — Ambulatory Visit: Payer: Self-pay

## 2016-06-23 ENCOUNTER — Telehealth: Payer: Self-pay | Admitting: *Deleted

## 2016-06-23 VITALS — BP 123/76 | HR 83 | Temp 97.8°F | Resp 20 | Wt 182.2 lb

## 2016-06-23 DIAGNOSIS — Z9049 Acquired absence of other specified parts of digestive tract: Secondary | ICD-10-CM | POA: Insufficient documentation

## 2016-06-23 DIAGNOSIS — C099 Malignant neoplasm of tonsil, unspecified: Secondary | ICD-10-CM | POA: Insufficient documentation

## 2016-06-23 DIAGNOSIS — Z510 Encounter for antineoplastic radiation therapy: Secondary | ICD-10-CM | POA: Insufficient documentation

## 2016-06-23 DIAGNOSIS — C760 Malignant neoplasm of head, face and neck: Secondary | ICD-10-CM

## 2016-06-23 DIAGNOSIS — R1310 Dysphagia, unspecified: Secondary | ICD-10-CM | POA: Insufficient documentation

## 2016-06-23 NOTE — Progress Notes (Signed)
Radiation Oncology Follow up Note  Name: Abdullah Gravell   Date:   06/23/2016 MRN:  [id number] DOB: Apr 04, 1966    This 50 y.o. male presents to the clinic today for reevaluation head and neck cancer status post tonsillar biopsy.  REFERRING PROVIDER: No ref. provider found  HPI: Patient is a 50 year old male originally presented with a left sub-digastric lymph node which was positive for squamous cell carcinoma. PET CT scan showing hypermetabolic activity in the left sub-digastric node and some asymmetry in the tonsillar region. I evaluated him and recommended radiation therapy. With concurrent chemotherapy. He went on to have a tonsillectomy showing primary tumor to be in the left tonsillar region. He is seen today one week out of surgery and is doing well he's having very little side effects at this time some minor swallowing difficulties which would be expected.  COMPLICATIONS OF TREATMENT: none  FOLLOW UP COMPLIANCE: keeps appointments   PHYSICAL EXAM:  BP 123/76   Pulse 83   Temp 97.8 F (36.6 C)   Resp 20   Wt 182 lb 3.4 oz (82.6 kg)   BMI 24.04 kg/m  Oral cavity is clear he does have areas of granulation tissue in the tonsillar fossa is bilaterally. Neck shows slight mallet nodularity in the area of upper cervical chain with known area of metastatic cancer involvement. Well-developed well-nourished patient in NAD. HEENT reveals PERLA, EOMI, discs not visualized.  Oral cavity is clear. No oral mucosal lesions are identified. Neck is clear without evidence of cervical or supraclavicular adenopathy. Lungs are clear to A&P. Cardiac examination is essentially unremarkable with regular rate and rhythm without murmur rub or thrill. Abdomen is benign with no organomegaly or masses noted. Motor sensory and DTR levels are equal and symmetric in the upper and lower extremities. Cranial nerves II through XII are grossly intact. Proprioception is intact. No peripheral adenopathy or edema is  identified. No motor or sensory levels are noted. Crude visual fields are within normal range.  RADIOLOGY RESULTS: No current films for review  PLAN: At this time I like to go ahead with radiation therapy to his head and neck. Would incorporate the area of cervical lymph node involvement as well as left tonsillar base and treat to 7000 cGy over 7 weeks using IM RT treatment planning and delivery. Would also dose painting his remaining lymph nodes to 5400 cGy again using I am RT. Risks and benefits of treatment again were reviewed with the patient including dysphasia oral mucositis skin reaction fatigue alteration of blood counts. I personally set up and ordered CT simulation in about a week's time to allow some more healing. We will also coordinate with medical oncology his chemotherapy treatments.There will be extra effort by both professional staff as well as technical staff to coordinate and manage concurrent chemoradiation and ensuing side effects during his treatments.   I would like to take this opportunity to thank you for allowing me to participate in the care of your patient.Armstead Peaks., MD

## 2016-06-23 NOTE — Telephone Encounter (Signed)
I called to check on Mitsugi and he said he is doing great after getting his tonsils out. Aaidan would like to exercise in our CARE Exercise Program. (Free to any person seeing an oncologist even once a year or during cancer treatments=Cancer Associated Rehabilitation and Exercise). Ahmani said he is driving to see Dr. Baruch Gouty to discuss his cancer treatment options. I asked him to ask Dr. Baruch Gouty to order via EPIC/CHL in the order section to type in CARE Ref.    That should give the clearance for Trevis Seidenberg to exercise on Tuesdays and Thursday at 12:15pm in the [place] (where the Cardiac/Pulm Rehab exercise program is held also).

## 2016-06-24 ENCOUNTER — Other Ambulatory Visit: Payer: Self-pay | Admitting: *Deleted

## 2016-06-24 ENCOUNTER — Ambulatory Visit: Payer: Self-pay

## 2016-06-24 ENCOUNTER — Telehealth: Payer: Self-pay | Admitting: *Deleted

## 2016-06-24 DIAGNOSIS — C760 Malignant neoplasm of head, face and neck: Secondary | ICD-10-CM

## 2016-06-24 NOTE — Telephone Encounter (Signed)
I called Philip Cordova to tell him that the doctor sent a CARE referral for him to exercise with [place]. (Cancer Associated Rehabilitation and Exercise). Philip Cordova said he will probably wait for 2 weeks before he starts to exercise with [place] on Tuesday and Thursdays at 12:15pm. [phone].

## 2016-06-25 ENCOUNTER — Ambulatory Visit: Payer: Self-pay

## 2016-06-25 ENCOUNTER — Inpatient Hospital Stay: Payer: Self-pay

## 2016-06-28 ENCOUNTER — Ambulatory Visit: Payer: Self-pay

## 2016-06-29 ENCOUNTER — Inpatient Hospital Stay (HOSPITAL_BASED_OUTPATIENT_CLINIC_OR_DEPARTMENT_OTHER): Payer: Self-pay | Admitting: Internal Medicine

## 2016-06-29 ENCOUNTER — Inpatient Hospital Stay: Payer: Self-pay

## 2016-06-29 ENCOUNTER — Ambulatory Visit: Payer: Self-pay

## 2016-06-29 VITALS — BP 120/72 | HR 62 | Temp 97.4°F | Resp 20 | Ht 73.0 in | Wt 181.9 lb

## 2016-06-29 DIAGNOSIS — C760 Malignant neoplasm of head, face and neck: Secondary | ICD-10-CM

## 2016-06-29 DIAGNOSIS — Z87891 Personal history of nicotine dependence: Secondary | ICD-10-CM

## 2016-06-29 DIAGNOSIS — Z79899 Other long term (current) drug therapy: Secondary | ICD-10-CM

## 2016-06-29 DIAGNOSIS — A630 Anogenital (venereal) warts: Secondary | ICD-10-CM

## 2016-06-29 DIAGNOSIS — C099 Malignant neoplasm of tonsil, unspecified: Secondary | ICD-10-CM

## 2016-06-29 LAB — CBC WITH DIFFERENTIAL/PLATELET
Basophils Absolute: 0.1 10*3/uL (ref 0–0.1)
Basophils Relative: 1 %
Eosinophils Absolute: 0.3 10*3/uL (ref 0–0.7)
Eosinophils Relative: 4 %
HEMATOCRIT: 41.3 % (ref 40.0–52.0)
Hemoglobin: 13.8 g/dL (ref 13.0–18.0)
LYMPHS PCT: 26 %
Lymphs Abs: 1.7 10*3/uL (ref 1.0–3.6)
MCH: 31 pg (ref 26.0–34.0)
MCHC: 33.4 g/dL (ref 32.0–36.0)
MCV: 93 fL (ref 80.0–100.0)
MONO ABS: 0.7 10*3/uL (ref 0.2–1.0)
MONOS PCT: 11 %
Neutro Abs: 3.8 10*3/uL (ref 1.4–6.5)
Neutrophils Relative %: 58 %
PLATELETS: 264 10*3/uL (ref 150–440)
RBC: 4.44 MIL/uL (ref 4.40–5.90)
RDW: 13 % (ref 11.5–14.5)
WBC: 6.5 10*3/uL (ref 3.8–10.6)

## 2016-06-29 LAB — COMPREHENSIVE METABOLIC PANEL
ALT: 18 U/L (ref 17–63)
ANION GAP: 8 (ref 5–15)
AST: 21 U/L (ref 15–41)
Albumin: 4.1 g/dL (ref 3.5–5.0)
Alkaline Phosphatase: 56 U/L (ref 38–126)
BILIRUBIN TOTAL: 0.8 mg/dL (ref 0.3–1.2)
BUN: 17 mg/dL (ref 6–20)
CO2: 27 mmol/L (ref 22–32)
Calcium: 9.3 mg/dL (ref 8.9–10.3)
Chloride: 102 mmol/L (ref 101–111)
Creatinine, Ser: 1.03 mg/dL (ref 0.61–1.24)
Glucose, Bld: 183 mg/dL — ABNORMAL HIGH (ref 65–99)
Potassium: 4.5 mmol/L (ref 3.5–5.1)
Sodium: 137 mmol/L (ref 135–145)
TOTAL PROTEIN: 7.3 g/dL (ref 6.5–8.1)

## 2016-06-29 LAB — MAGNESIUM: Magnesium: 1.9 mg/dL (ref 1.7–2.4)

## 2016-06-29 MED ORDER — ONDANSETRON HCL 8 MG PO TABS: ORAL_TABLET | ORAL | 1 refills | Status: DC

## 2016-06-29 MED ORDER — PROCHLORPERAZINE MALEATE 10 MG PO TABS: 10.0000 mg | ORAL_TABLET | Freq: Four times a day (QID) | ORAL | 1 refills | Status: DC | PRN

## 2016-06-29 NOTE — Progress Notes (Signed)
[facility] CONSULT NOTE  Patient Care Team: No Pcp Per Patient as PCP - General (General Practice)    Oncology History   # AUG 2017-STAGE III [T1N1]SQUAMOUS CELL CA [s/p FNA; Dr.Vaught]  LEFT NECK/Submandibular LN- necrotic [~2-2.5cm]; Right neck [26mm]; SEP 22nd PET- left neck uptake; ? Tonsillar uptake L>R; OCT 2017- Left Tonsil POSITIVE   # Low testosterone [on testosterone shots]     Squamous cell carcinoma of head and neck ([place])   05/18/2016 Initial Diagnosis    Squamous cell carcinoma of head and neck (HCC)       HISTORY OF PRESENTING ILLNESS:  Philip Cordova 50 y.o.  male with newly diagnosed squamous cell carcinoma For follow-up post bilateral tonsillar biopsy.  He is recuperating well to surgery. He is continues to deny difficulty swallowing or pain with swallowing. Denies any sore throat. He denies his neck lymph nodes getting any worse. Denies any hoarseness of voice.  ROS: A complete 10 point review of system is done which is negative except mentioned above in history of present illness  MEDICAL HISTORY:  Past Medical History:  Diagnosis Date  . Asthma   . 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 Cancer ([place])     SURGICAL HISTORY: Past Surgical History:  Procedure Laterality Date  . APPENDECTOMY  2008  . DIAGNOSTIC LARYNGOSCOPY  06/17/2016   Procedure: DIAGNOSTIC LARYNGOSCOPY WITH BIOPSIES;  Surgeon: Carloyn Manner, MD;  Location: [facility] ORS;  Service: ENT;;  . Moles removed  2005  . RHINOPLASTY  1990  . 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  Lives in [place].   Social History   Social History  . Marital status: Single    Spouse name: N/A  . Number of children: N/A  . Years of education: N/A   Occupational History  . Not on file.   Social  History Main Topics  . Smoking status: Former Smoker    Years: 1.00    Types: Cigarettes  . Smokeless tobacco: Former Systems developer     Comment: "YEARS AGO"  . Alcohol use No     Comment: Occasionally  . Drug use: No  . Sexual activity: Not on file   Other Topics Concern  . Not on file   Social History Narrative  . No narrative on file    FAMILY HISTORY:  Family History  Problem Relation Age of Onset  . Cancer Father     Skin  . Hyperlipidemia Father   . Hypertension Father     Possible  . Diabetes Paternal Uncle     ALLERGIES:  is allergic to milk-related compounds.  MEDICATIONS:  Current Outpatient Prescriptions  Medication Sig Dispense Refill  . Multiple Vitamins-Minerals (MULTIVITAMIN WITH MINERALS) tablet Take by mouth.    Marland Kitchen albuterol (PROVENTIL HFA;VENTOLIN HFA) 108 (90 Base) MCG/ACT inhaler Inhale 2 puffs into the lungs every 6 (six) hours as needed for wheezing or shortness of breath.    Marland Kitchen aspirin EC 81 MG tablet Take 81 mg by mouth daily.    . B Complex-Biotin-FA (SUPER B-100) TABS Take 1 tablet by mouth daily.    . calcium carbonate 100 mg/ml SUSP     . docusate sodium (COLACE) 100 MG capsule Take 1 capsule (100 mg total) by mouth 2 (two) times daily. (Patient not  taking: Reported on 06/29/2016) 40 capsule 0  . Ergocalciferol (VITAMIN D2 PO)     . fluticasone (VERAMYST) 27.5 MCG/SPRAY nasal spray Place 2 sprays into the nose daily.    . Fluticasone-Salmeterol (ADVAIR) 250-50 MCG/DOSE AEPB Inhale 1 puff into the lungs every morning.     . Melatonin 10 MG TABS Take 1 tablet by mouth at bedtime.    . ondansetron (ZOFRAN) 8 MG tablet One pill every 8 hours as needed for nausea/ vomitting. 40 tablet 1  . prochlorperazine (COMPAZINE) 10 MG tablet Take 1 tablet (10 mg total) by mouth every 6 (six) hours as needed for nausea or vomiting. 40 tablet 1   No current facility-administered medications for this visit.       Marland Kitchen  PHYSICAL EXAMINATION: ECOG PERFORMANCE STATUS: 0 -  Asymptomatic  Vitals:   06/29/16 1052  BP: 120/72  Pulse: 62  Resp: 20  Temp: 97.4 F (36.3 C)   Filed Weights   06/29/16 1052  Weight: 181 lb 14.1 oz (82.5 kg)    GENERAL: Well-nourished well-developed; Alert, no distress and comfortable. He is alone. EYES: no pallor or icterus OROPHARYNX: no thrush or ulceration; good dentition  NECK: supple, no masses felt LYMPH:  no palpable lymphadenopathy in the axillary or inguinal regions; appx ~2cm LN felt in left submandibular region.  LUNGS: clear to auscultation and  No wheeze or crackles HEART/CVS: regular rate & rhythm and no murmurs; No lower extremity edema ABDOMEN: abdomen soft, non-tender and normal bowel sounds Musculoskeletal:no cyanosis of digits and no clubbing  PSYCH: alert & oriented x 3 with fluent speech NEURO: no focal motor/sensory deficits SKIN:  no rashes or significant lesions  LABORATORY DATA:  I have reviewed the data as listed Lab Results  Component Value Date   WBC 6.5 06/29/2016   HGB 13.8 06/29/2016   HCT 41.3 06/29/2016   MCV 93.0 06/29/2016   PLT 264 06/29/2016    Recent Labs  04/14/16 1010 06/29/16 1002  NA 140 137  K 4.5 4.5  CL 99 102  CO2 24 27  GLUCOSE 100* 183*  BUN 19 17  CREATININE 1.06 1.03  CALCIUM 9.7 9.3  GFRNONAA 83 >60  GFRAA 95 >60  PROT 7.1 7.3  ALBUMIN 4.3 4.1  AST 30 21  ALT 32 18  ALKPHOS 57 56  BILITOT 0.8 0.8    RADIOGRAPHIC STUDIES: I have personally reviewed the radiological images as listed and agreed with the findings in the report. No results found.  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 Plan definitive chemo- RT; with cisplatin 100 mg/m every 3 weeks- starting Nov 9th.   # Discussed the potential side effects of chemotherapy including but not limited to fatigue kidney dysfunction; source in the mouth; drop in blood counts etc.  Anti-emetics sent.   # plan cisplatin- starting Nov 9th/labs-cbc;cmp;mag; see me back on Nov 20th/labs.   All questions were answered. The patient knows to call the clinic with any problems, questions or concerns.  Cammie Sickle, MD 06/29/2016 4:09 PM

## 2016-06-29 NOTE — Assessment & Plan Note (Addendum)
#   Squamous cell carcinoma-  Left BOT/Tonsillar- T1N1- STAGE III HPV- positive/ p16 positive. Plan definitive chemo- RT; with cisplatin 100 mg/m every 3 weeks- starting Nov 9th.   # Discussed the potential side effects of chemotherapy including but not limited to fatigue kidney dysfunction; source in the mouth; drop in blood counts etc. Anti-emetics sent.   # plan cisplatin- starting Nov 9th/labs-cbc;cmp;mag; see me back on Nov 20th/labs.

## 2016-06-29 NOTE — Progress Notes (Signed)
Patient here for follow-up with Dr. Rogue Bussing.  Patient has recovered well from his tonsillectomy.

## 2016-06-30 ENCOUNTER — Ambulatory Visit: Payer: Self-pay

## 2016-07-01 ENCOUNTER — Ambulatory Visit: Payer: Self-pay

## 2016-07-01 ENCOUNTER — Ambulatory Visit
Admission: RE | Admit: 2016-07-01 | Discharge: 2016-07-01 | Disposition: A | Discharge status: Home / Self Care | Payer: Self-pay | Source: Ambulatory Visit | Attending: Radiation Oncology | Admitting: Radiation Oncology

## 2016-07-02 ENCOUNTER — Ambulatory Visit: Payer: Self-pay

## 2016-07-02 ENCOUNTER — Telehealth: Payer: Self-pay | Admitting: Internal Medicine

## 2016-07-02 ENCOUNTER — Inpatient Hospital Stay: Payer: Self-pay

## 2016-07-02 DIAGNOSIS — T451X5A Adverse effect of antineoplastic and immunosuppressive drugs, initial encounter: Secondary | ICD-10-CM

## 2016-07-02 DIAGNOSIS — R110 Nausea: Secondary | ICD-10-CM

## 2016-07-02 MED ORDER — ONDANSETRON HCL 8 MG PO TABS: ORAL_TABLET | ORAL | 1 refills | Status: DC

## 2016-07-02 MED ORDER — PROCHLORPERAZINE MALEATE 10 MG PO TABS: 10.0000 mg | ORAL_TABLET | Freq: Four times a day (QID) | ORAL | 1 refills | Status: DC | PRN

## 2016-07-02 NOTE — Telephone Encounter (Signed)
The form doesn't really need to be "completed." It just requires the MD information such as his license number and then his signature. Philip Cordova said he can fill out all the other stuff such as address of [place] and any other fields. With that in mind, could he possibly get it by Tuesday? Thanks.

## 2016-07-02 NOTE — Telephone Encounter (Signed)
Per pt request, I have faxed over to you a time-sensitive form he needs completed and signed by Dr. Jacinto Reap. He would like to come pick it up either in [place] on Monday or here in [place] on Tuesday. Please advise pt when/where it is ready to be picked up.   Also, please change his pharmacy [facility]. That is where he wants any Rx (including the one they ordered about three days ago) sent. He will need to start it on Thursday. Thank you.

## 2016-07-02 NOTE — Telephone Encounter (Signed)
The turn around time for any form to be completed it 7 - 10 days to complete.   I have updated the pharmacy.  Antiemetics were sent to [facility] on 10/31. I have sent these rxs to his [facility].

## 2016-07-02 NOTE — Telephone Encounter (Signed)
Dr. B signed the form for Philip Cordova for fertility preserving.   I will bring the form to the [facility] clinic on Tuesday for pt to pick up.

## 2016-07-05 ENCOUNTER — Ambulatory Visit: Payer: Self-pay

## 2016-07-06 ENCOUNTER — Ambulatory Visit: Payer: Self-pay

## 2016-07-07 ENCOUNTER — Ambulatory Visit: Payer: Self-pay

## 2016-07-08 ENCOUNTER — Inpatient Hospital Stay: Payer: Self-pay

## 2016-07-08 ENCOUNTER — Ambulatory Visit: Payer: Self-pay

## 2016-07-08 ENCOUNTER — Inpatient Hospital Stay: Payer: Self-pay | Attending: Internal Medicine

## 2016-07-08 ENCOUNTER — Other Ambulatory Visit: Payer: Self-pay | Admitting: Internal Medicine

## 2016-07-08 VITALS — BP 113/75 | HR 57 | Temp 96.9°F | Resp 18

## 2016-07-08 DIAGNOSIS — C760 Malignant neoplasm of head, face and neck: Secondary | ICD-10-CM

## 2016-07-08 DIAGNOSIS — Z87891 Personal history of nicotine dependence: Secondary | ICD-10-CM | POA: Insufficient documentation

## 2016-07-08 DIAGNOSIS — Z7982 Long term (current) use of aspirin: Secondary | ICD-10-CM | POA: Insufficient documentation

## 2016-07-08 DIAGNOSIS — A630 Anogenital (venereal) warts: Secondary | ICD-10-CM | POA: Insufficient documentation

## 2016-07-08 DIAGNOSIS — A419 Sepsis, unspecified organism: Secondary | ICD-10-CM | POA: Insufficient documentation

## 2016-07-08 DIAGNOSIS — Z5111 Encounter for antineoplastic chemotherapy: Secondary | ICD-10-CM | POA: Insufficient documentation

## 2016-07-08 DIAGNOSIS — C099 Malignant neoplasm of tonsil, unspecified: Secondary | ICD-10-CM | POA: Insufficient documentation

## 2016-07-08 DIAGNOSIS — J45909 Unspecified asthma, uncomplicated: Secondary | ICD-10-CM | POA: Insufficient documentation

## 2016-07-08 DIAGNOSIS — Z79899 Other long term (current) drug therapy: Secondary | ICD-10-CM | POA: Insufficient documentation

## 2016-07-08 LAB — COMPREHENSIVE METABOLIC PANEL
ALT: 20 U/L (ref 17–63)
ANION GAP: 5 (ref 5–15)
AST: 20 U/L (ref 15–41)
Albumin: 4.1 g/dL (ref 3.5–5.0)
Alkaline Phosphatase: 57 U/L (ref 38–126)
BILIRUBIN TOTAL: 0.6 mg/dL (ref 0.3–1.2)
BUN: 21 mg/dL — ABNORMAL HIGH (ref 6–20)
CO2: 29 mmol/L (ref 22–32)
Calcium: 9.3 mg/dL (ref 8.9–10.3)
Chloride: 103 mmol/L (ref 101–111)
Creatinine, Ser: 0.93 mg/dL (ref 0.61–1.24)
GFR calc Af Amer: >60 mL/min (ref >60)
GFR calc non Af Amer: >60 mL/min (ref >60)
GLUCOSE: 100 mg/dL — AB (ref 65–99)
Potassium: 3.5 mmol/L (ref 3.5–5.1)
Sodium: 137 mmol/L (ref 135–145)
TOTAL PROTEIN: 7.3 g/dL (ref 6.5–8.1)

## 2016-07-08 LAB — CBC WITH DIFFERENTIAL/PLATELET
Basophils Absolute: 0.1 10*3/uL (ref 0–0.1)
Basophils Relative: 1 %
EOS ABS: 0.4 10*3/uL (ref 0–0.7)
Eosinophils Relative: 6 %
HEMATOCRIT: 38.9 % — AB (ref 40.0–52.0)
Hemoglobin: 13.5 g/dL (ref 13.0–18.0)
Lymphocytes Relative: 23 %
Lymphs Abs: 1.7 10*3/uL (ref 1.0–3.6)
MCH: 31.7 pg (ref 26.0–34.0)
MCHC: 34.8 g/dL (ref 32.0–36.0)
MCV: 91.1 fL (ref 80.0–100.0)
MONOS PCT: 10 %
Monocytes Absolute: 0.8 10*3/uL (ref 0.2–1.0)
NEUTROS ABS: 4.5 10*3/uL (ref 1.4–6.5)
Neutrophils Relative %: 60 %
Platelets: 278 10*3/uL (ref 150–440)
RBC: 4.27 MIL/uL — ABNORMAL LOW (ref 4.40–5.90)
RDW: 13.1 % (ref 11.5–14.5)
WBC: 7.5 10*3/uL (ref 3.8–10.6)

## 2016-07-08 LAB — MAGNESIUM: MAGNESIUM: 1.9 mg/dL (ref 1.7–2.4)

## 2016-07-08 MED ORDER — SODIUM CHLORIDE 0.9 % IV SOLN
INTRAVENOUS | Status: DC
  Administered 2016-07-08: 10:00:00 via INTRAVENOUS
  Filled 2016-07-08: qty 1000

## 2016-07-08 MED ORDER — PALONOSETRON HCL INJECTION 0.25 MG/5ML
0.2500 mg | Freq: Once | INTRAVENOUS | Status: AC
  Administered 2016-07-08: 0.25 mg via INTRAVENOUS
  Filled 2016-07-08: qty 5

## 2016-07-08 MED ORDER — SODIUM CHLORIDE 0.9 % IV SOLN
100.0000 mg/m2 | Freq: Once | INTRAVENOUS | Status: AC
  Administered 2016-07-08: 214 mg via INTRAVENOUS
  Filled 2016-07-08: qty 214

## 2016-07-08 MED ORDER — SODIUM CHLORIDE 0.9 % IV SOLN
Freq: Once | INTRAVENOUS | Status: AC
  Administered 2016-07-08: 13:00:00 via INTRAVENOUS
  Filled 2016-07-08: qty 5

## 2016-07-08 MED ORDER — POTASSIUM CHLORIDE 2 MEQ/ML IV SOLN
Freq: Once | INTRAVENOUS | Status: AC
  Administered 2016-07-08: 11:00:00 via INTRAVENOUS
  Filled 2016-07-08: qty 1000

## 2016-07-09 ENCOUNTER — Ambulatory Visit: Payer: Self-pay

## 2016-07-09 ENCOUNTER — Inpatient Hospital Stay: Payer: Self-pay

## 2016-07-09 ENCOUNTER — Telehealth: Payer: Self-pay | Admitting: *Deleted

## 2016-07-09 NOTE — Telephone Encounter (Signed)
Advised per VO Dr Rogue Bussing OK to use Tums or Prilosec

## 2016-07-09 NOTE — Telephone Encounter (Signed)
Reports he had first chemo yesterday and is having heartburn today. Asking if it is OK to take Tums or Prilosec. Please advise

## 2016-07-12 ENCOUNTER — Ambulatory Visit: Payer: Self-pay

## 2016-07-13 ENCOUNTER — Ambulatory Visit: Payer: Self-pay

## 2016-07-13 ENCOUNTER — Ambulatory Visit
Admission: RE | Admit: 2016-07-13 | Discharge: 2016-07-13 | Disposition: A | Discharge status: Home / Self Care | Payer: Self-pay | Source: Ambulatory Visit | Attending: Radiation Oncology | Admitting: Radiation Oncology

## 2016-07-14 ENCOUNTER — Ambulatory Visit
Admission: RE | Admit: 2016-07-14 | Discharge: 2016-07-14 | Disposition: A | Discharge status: Home / Self Care | Payer: Self-pay | Source: Ambulatory Visit | Attending: Radiation Oncology | Admitting: Radiation Oncology

## 2016-07-14 ENCOUNTER — Ambulatory Visit: Payer: Self-pay

## 2016-07-15 ENCOUNTER — Ambulatory Visit
Admission: RE | Admit: 2016-07-15 | Discharge: 2016-07-15 | Disposition: A | Discharge status: Home / Self Care | Payer: Self-pay | Source: Ambulatory Visit | Attending: Radiation Oncology | Admitting: Radiation Oncology

## 2016-07-15 ENCOUNTER — Ambulatory Visit: Payer: Self-pay

## 2016-07-16 ENCOUNTER — Ambulatory Visit: Payer: Self-pay

## 2016-07-16 ENCOUNTER — Ambulatory Visit
Admission: RE | Admit: 2016-07-16 | Discharge: 2016-07-16 | Disposition: A | Discharge status: Home / Self Care | Payer: Self-pay | Source: Ambulatory Visit | Attending: Radiation Oncology | Admitting: Radiation Oncology

## 2016-07-16 ENCOUNTER — Inpatient Hospital Stay: Payer: Self-pay

## 2016-07-19 ENCOUNTER — Ambulatory Visit: Payer: Self-pay

## 2016-07-19 ENCOUNTER — Inpatient Hospital Stay: Payer: Self-pay

## 2016-07-19 ENCOUNTER — Inpatient Hospital Stay (HOSPITAL_BASED_OUTPATIENT_CLINIC_OR_DEPARTMENT_OTHER): Payer: Self-pay | Admitting: Internal Medicine

## 2016-07-19 ENCOUNTER — Ambulatory Visit
Admission: RE | Admit: 2016-07-19 | Discharge: 2016-07-19 | Disposition: A | Discharge status: Home / Self Care | Payer: Self-pay | Source: Ambulatory Visit | Attending: Radiation Oncology | Admitting: Radiation Oncology

## 2016-07-19 VITALS — BP 132/87 | HR 76 | Temp 97.2°F | Resp 18 | Wt 191.4 lb

## 2016-07-19 DIAGNOSIS — Z87891 Personal history of nicotine dependence: Secondary | ICD-10-CM

## 2016-07-19 DIAGNOSIS — Z713 Dietary counseling and surveillance: Secondary | ICD-10-CM

## 2016-07-19 DIAGNOSIS — C099 Malignant neoplasm of tonsil, unspecified: Secondary | ICD-10-CM

## 2016-07-19 DIAGNOSIS — C760 Malignant neoplasm of head, face and neck: Secondary | ICD-10-CM

## 2016-07-19 DIAGNOSIS — Z79899 Other long term (current) drug therapy: Secondary | ICD-10-CM

## 2016-07-19 DIAGNOSIS — A630 Anogenital (venereal) warts: Secondary | ICD-10-CM

## 2016-07-19 LAB — CBC WITH DIFFERENTIAL/PLATELET
Basophils Absolute: 0.1 10*3/uL (ref 0–0.1)
Basophils Relative: 1 %
EOS ABS: 0.2 10*3/uL (ref 0–0.7)
Eosinophils Relative: 4 %
HCT: 36.9 % — ABNORMAL LOW (ref 40.0–52.0)
Hemoglobin: 12.7 g/dL — ABNORMAL LOW (ref 13.0–18.0)
Lymphocytes Relative: 23 %
Lymphs Abs: 1.3 10*3/uL (ref 1.0–3.6)
MCH: 31.7 pg (ref 26.0–34.0)
MCHC: 34.6 g/dL (ref 32.0–36.0)
MCV: 91.6 fL (ref 80.0–100.0)
MONO ABS: 0.9 10*3/uL (ref 0.2–1.0)
MONOS PCT: 15 %
Neutro Abs: 3.3 10*3/uL (ref 1.4–6.5)
Neutrophils Relative %: 57 %
Platelets: 247 10*3/uL (ref 150–440)
RBC: 4.02 MIL/uL — ABNORMAL LOW (ref 4.40–5.90)
RDW: 13.1 % (ref 11.5–14.5)
WBC: 5.7 10*3/uL (ref 3.8–10.6)

## 2016-07-19 LAB — COMPREHENSIVE METABOLIC PANEL
ALT: 34 U/L (ref 17–63)
ANION GAP: 9 (ref 5–15)
AST: 29 U/L (ref 15–41)
Albumin: 4 g/dL (ref 3.5–5.0)
Alkaline Phosphatase: 52 U/L (ref 38–126)
BUN: 15 mg/dL (ref 6–20)
CALCIUM: 9.1 mg/dL (ref 8.9–10.3)
CHLORIDE: 99 mmol/L — AB (ref 101–111)
CO2: 30 mmol/L (ref 22–32)
Creatinine, Ser: 0.94 mg/dL (ref 0.61–1.24)
GFR calc Af Amer: >60 mL/min (ref >60)
Glucose, Bld: 108 mg/dL — ABNORMAL HIGH (ref 65–99)
Potassium: 3.9 mmol/L (ref 3.5–5.1)
SODIUM: 138 mmol/L (ref 135–145)
Total Bilirubin: 0.4 mg/dL (ref 0.3–1.2)
Total Protein: 7 g/dL (ref 6.5–8.1)

## 2016-07-19 LAB — MAGNESIUM: Magnesium: 1.9 mg/dL (ref 1.7–2.4)

## 2016-07-19 NOTE — Progress Notes (Signed)
Patient is here for follow up, he is doing well 

## 2016-07-19 NOTE — Progress Notes (Signed)
[facility] CONSULT NOTE  Patient Care Team: No Pcp Per Patient as PCP - General (General Practice)    Oncology History   # AUG 2017-STAGE III [T1N1]SQUAMOUS CELL CA [s/p FNA; Dr.Vaught]  LEFT NECK/Submandibular LN- necrotic [~2-2.5cm]; Right neck [59mm]; SEP 22nd PET- left neck uptake; ? Tonsillar uptake L>R; OCT 2017- Left Tonsil POSITIVE   # Low testosterone [on testosterone shots]     Squamous cell carcinoma of head and neck ([place])   05/18/2016 Initial Diagnosis    Squamous cell carcinoma of head and neck (HCC)       HISTORY OF PRESENTING ILLNESS:  Philip Cordova 50 y.o.  male with newly diagnosed squamous cell carcinoma S/P positive currently on definitive chemoradiation. Patient status post cycle #1 of cisplatin approximately 10 days ago.   Patient complained of  "foggy brain" for about 3 days after the chemotherapy. Otherwise denies any nausea vomiting. Denies any tingling or numbness.  He is continues to deny difficulty swallowing or pain with swallowing. Denies any sore throat. He denies his neck lymph nodes getting any worse. Denies any hoarseness of voice. He has multiple questions regarding his over-the-counter medications. Complains of difficulty sleeping at night time.   ROS: A complete 10 point review of system is done which is negative except mentioned above in history of present illness  MEDICAL HISTORY:  Past Medical History:  Diagnosis Date  . Asthma   . 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 Cancer ([place])     SURGICAL HISTORY: Past Surgical History:  Procedure Laterality Date  . APPENDECTOMY  2008  . DIAGNOSTIC LARYNGOSCOPY  06/17/2016   Procedure: DIAGNOSTIC LARYNGOSCOPY WITH BIOPSIES;  Surgeon: Carloyn Manner, MD;  Location: [facility] ORS;  Service: ENT;;  . Moles removed  2005  . RHINOPLASTY  1990  . 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  Lives in [place].   Social History   Social History  . Marital status: Single    Spouse name: N/A  . Number of children: N/A  . Years of education: N/A   Occupational History  . Not on file.   Social History Main Topics  . Smoking status: Former Smoker    Years: 1.00    Types: Cigarettes  . Smokeless tobacco: Former Systems developer     Comment: "YEARS AGO"  . Alcohol use No     Comment: Occasionally  . Drug use: No  . Sexual activity: Not on file   Other Topics Concern  . Not on file   Social History Narrative  . No narrative on file    FAMILY HISTORY:  Family History  Problem Relation Age of Onset  . Cancer Father     Skin  . Hyperlipidemia Father   . Hypertension Father     Possible  . Diabetes Paternal Uncle     ALLERGIES:  is allergic to milk-related compounds.  MEDICATIONS:  Current Outpatient Prescriptions  Medication Sig Dispense Refill  . albuterol (PROVENTIL HFA;VENTOLIN HFA) 108 (90 Base) MCG/ACT inhaler Inhale 2 puffs into the lungs every 6 (six) hours as needed for wheezing or shortness of breath.    Marland Kitchen GLUCOSAMINE-CHONDROITIN PO Take by mouth.    . MELATONIN PO Take 10 mg by mouth daily.    . Multiple Vitamins-Minerals (MULTIVITAMIN WITH MINERALS) tablet  Take by mouth.    Marland Kitchen aspirin EC 81 MG tablet Take 81 mg by mouth daily.    . B Complex-Biotin-FA (SUPER B-100) TABS Take 1 tablet by mouth daily.    . calcium carbonate 100 mg/ml SUSP     . docusate sodium (COLACE) 100 MG capsule Take 1 capsule (100 mg total) by mouth 2 (two) times daily. (Patient not taking: Reported on 07/19/2016) 40 capsule 0  . Ergocalciferol (VITAMIN D2 PO)     . fluticasone (VERAMYST) 27.5 MCG/SPRAY nasal spray Place 2 sprays into the nose daily.    . Fluticasone-Salmeterol (ADVAIR) 250-50 MCG/DOSE AEPB Inhale 1 puff into the lungs every morning.     . Melatonin 10 MG TABS Take 1 tablet by mouth at  bedtime.    . ondansetron (ZOFRAN) 8 MG tablet One pill every 8 hours as needed for nausea/ vomitting. (Patient not taking: Reported on 07/19/2016) 40 tablet 1  . prochlorperazine (COMPAZINE) 10 MG tablet Take 1 tablet (10 mg total) by mouth every 6 (six) hours as needed for nausea or vomiting. (Patient not taking: Reported on 07/19/2016) 40 tablet 1   No current facility-administered medications for this visit.       Marland Kitchen  PHYSICAL EXAMINATION: ECOG PERFORMANCE STATUS: 0 - Asymptomatic  Vitals:   07/19/16 0853  BP: 132/87  Pulse: 76  Resp: 18  Temp: 97.2 F (36.2 C)   Filed Weights   07/19/16 0853  Weight: 191 lb 6.4 oz (86.8 kg)    GENERAL: Well-nourished well-developed; Alert, no distress and comfortable. He is alone. EYES: no pallor or icterus OROPHARYNX: no thrush or ulceration; good dentition  NECK: supple, no masses felt LYMPH:  no palpable lymphadenopathy in the axillary or inguinal regions; appx ~2cm LN felt in left submandibular region.  LUNGS: clear to auscultation and  No wheeze or crackles HEART/CVS: regular rate & rhythm and no murmurs; No lower extremity edema ABDOMEN: abdomen soft, non-tender and normal bowel sounds Musculoskeletal:no cyanosis of digits and no clubbing  PSYCH: alert & oriented x 3 with fluent speech NEURO: no focal motor/sensory deficits SKIN:  no rashes or significant lesions  LABORATORY DATA:  I have reviewed the data as listed Lab Results  Component Value Date   WBC 5.7 07/19/2016   HGB 12.7 (L) 07/19/2016   HCT 36.9 (L) 07/19/2016   MCV 91.6 07/19/2016   PLT 247 07/19/2016    Recent Labs  04/14/16 1010 06/29/16 1002 07/08/16 0910  NA 140 137 137  K 4.5 4.5 3.5  CL 99 102 103  CO2 24 27 29   GLUCOSE 100* 183* 100*  BUN 19 17 21*  CREATININE 1.06 1.03 0.93  CALCIUM 9.7 9.3 9.3  GFRNONAA 83 >60 >60  GFRAA 95 >60 >60  PROT 7.1 7.3 7.3  ALBUMIN 4.3 4.1 4.1  AST 30 21 20   ALT 32 18 20  ALKPHOS 57 56 57  BILITOT 0.8 0.8  0.6    RADIOGRAPHIC STUDIES: I have personally reviewed the radiological images as listed and agreed with the findings in the report. No results found.  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 S/p cycle #1.   # Tolerated well; no major side effects noted.   # Anxiety/ insomnia- melatonin qhs  # discussed re: nutrition- will refer to dietician.   #  dicussed re: mucositis   # follow up on cycle # 2 cycle of cisplatin on 30th for chemo/labs.   All questions were answered. The patient knows to call the clinic with any problems, questions or concerns.  Cammie Sickle, MD 07/19/2016 9:33 AM

## 2016-07-19 NOTE — Assessment & Plan Note (Addendum)
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 S/p cycle #1.   # Tolerated well; no major side effects noted.   # Anxiety/ insomnia- melatonin qhs  # discussed re: nutrition- will refer to dietician.   # dicussed re: mucositis   # follow up on cycle # 2 cycle of cisplatin on 30th for chemo/labs.

## 2016-07-20 ENCOUNTER — Other Ambulatory Visit: Payer: Self-pay | Admitting: *Deleted

## 2016-07-20 ENCOUNTER — Ambulatory Visit: Payer: Self-pay

## 2016-07-20 ENCOUNTER — Ambulatory Visit
Admission: RE | Admit: 2016-07-20 | Discharge: 2016-07-20 | Disposition: A | Discharge status: Home / Self Care | Payer: Self-pay | Source: Ambulatory Visit | Attending: Radiation Oncology | Admitting: Radiation Oncology

## 2016-07-20 MED ORDER — SUCRALFATE 1 G PO TABS: 1.0000 g | ORAL_TABLET | Freq: Three times a day (TID) | ORAL | 3 refills | Status: DC

## 2016-07-21 ENCOUNTER — Ambulatory Visit
Admission: RE | Admit: 2016-07-21 | Discharge: 2016-07-21 | Disposition: A | Discharge status: Home / Self Care | Payer: Self-pay | Source: Ambulatory Visit | Attending: Radiation Oncology | Admitting: Radiation Oncology

## 2016-07-21 ENCOUNTER — Ambulatory Visit: Payer: Self-pay

## 2016-07-26 ENCOUNTER — Ambulatory Visit: Payer: Self-pay

## 2016-07-26 ENCOUNTER — Ambulatory Visit
Admission: RE | Admit: 2016-07-26 | Discharge: 2016-07-26 | Disposition: A | Discharge status: Home / Self Care | Payer: Self-pay | Source: Ambulatory Visit | Attending: Radiation Oncology | Admitting: Radiation Oncology

## 2016-07-27 ENCOUNTER — Ambulatory Visit: Payer: Self-pay

## 2016-07-27 ENCOUNTER — Ambulatory Visit
Admission: RE | Admit: 2016-07-27 | Discharge: 2016-07-27 | Disposition: A | Discharge status: Home / Self Care | Payer: Self-pay | Source: Ambulatory Visit | Attending: Radiation Oncology | Admitting: Radiation Oncology

## 2016-07-27 ENCOUNTER — Encounter: Payer: Self-pay | Admitting: *Deleted

## 2016-07-28 ENCOUNTER — Ambulatory Visit: Payer: Self-pay

## 2016-07-28 ENCOUNTER — Ambulatory Visit
Admission: RE | Admit: 2016-07-28 | Discharge: 2016-07-28 | Disposition: A | Discharge status: Home / Self Care | Payer: Self-pay | Source: Ambulatory Visit | Attending: Radiation Oncology | Admitting: Radiation Oncology

## 2016-07-29 ENCOUNTER — Inpatient Hospital Stay: Payer: Self-pay

## 2016-07-29 ENCOUNTER — Ambulatory Visit
Admission: RE | Admit: 2016-07-29 | Discharge: 2016-07-29 | Disposition: A | Discharge status: Home / Self Care | Payer: Self-pay | Source: Ambulatory Visit | Attending: Radiation Oncology | Admitting: Radiation Oncology

## 2016-07-29 ENCOUNTER — Ambulatory Visit: Payer: Self-pay

## 2016-07-29 ENCOUNTER — Inpatient Hospital Stay (HOSPITAL_BASED_OUTPATIENT_CLINIC_OR_DEPARTMENT_OTHER): Payer: Self-pay | Admitting: Internal Medicine

## 2016-07-29 VITALS — BP 150/93 | HR 84 | Temp 97.8°F | Resp 18 | Wt 190.2 lb

## 2016-07-29 DIAGNOSIS — Z87891 Personal history of nicotine dependence: Secondary | ICD-10-CM

## 2016-07-29 DIAGNOSIS — C760 Malignant neoplasm of head, face and neck: Secondary | ICD-10-CM

## 2016-07-29 DIAGNOSIS — C099 Malignant neoplasm of tonsil, unspecified: Secondary | ICD-10-CM

## 2016-07-29 DIAGNOSIS — A630 Anogenital (venereal) warts: Secondary | ICD-10-CM

## 2016-07-29 DIAGNOSIS — Z79899 Other long term (current) drug therapy: Secondary | ICD-10-CM

## 2016-07-29 LAB — CBC WITH DIFFERENTIAL/PLATELET
Basophils Absolute: 0 10*3/uL (ref 0–0.1)
Basophils Relative: 1 %
Eosinophils Absolute: 0.2 10*3/uL (ref 0–0.7)
Eosinophils Relative: 5 %
HCT: 35.5 % — ABNORMAL LOW (ref 40.0–52.0)
Hemoglobin: 12.2 g/dL — ABNORMAL LOW (ref 13.0–18.0)
Lymphocytes Relative: 19 %
Lymphs Abs: 0.7 10*3/uL — ABNORMAL LOW (ref 1.0–3.6)
MCH: 32 pg (ref 26.0–34.0)
MCHC: 34.3 g/dL (ref 32.0–36.0)
MCV: 93.4 fL (ref 80.0–100.0)
Monocytes Absolute: 0.6 10*3/uL (ref 0.2–1.0)
Monocytes Relative: 15 %
Neutro Abs: 2.2 10*3/uL (ref 1.4–6.5)
Neutrophils Relative %: 60 %
Platelets: 274 10*3/uL (ref 150–440)
RBC: 3.8 MIL/uL — ABNORMAL LOW (ref 4.40–5.90)
RDW: 13.9 % (ref 11.5–14.5)
WBC: 3.8 10*3/uL (ref 3.8–10.6)

## 2016-07-29 LAB — COMPREHENSIVE METABOLIC PANEL
ALT: 27 U/L (ref 17–63)
AST: 24 U/L (ref 15–41)
Albumin: 3.9 g/dL (ref 3.5–5.0)
Alkaline Phosphatase: 55 U/L (ref 38–126)
Anion gap: 7 (ref 5–15)
BILIRUBIN TOTAL: 0.6 mg/dL (ref 0.3–1.2)
BUN: 20 mg/dL (ref 6–20)
CALCIUM: 9.4 mg/dL (ref 8.9–10.3)
CHLORIDE: 100 mmol/L — AB (ref 101–111)
CO2: 32 mmol/L (ref 22–32)
CREATININE: 0.85 mg/dL (ref 0.61–1.24)
GFR calc Af Amer: >60 mL/min (ref >60)
Glucose, Bld: 103 mg/dL — ABNORMAL HIGH (ref 65–99)
Potassium: 3.9 mmol/L (ref 3.5–5.1)
Sodium: 139 mmol/L (ref 135–145)
Total Protein: 7 g/dL (ref 6.5–8.1)

## 2016-07-29 LAB — MAGNESIUM: Magnesium: 1.9 mg/dL (ref 1.7–2.4)

## 2016-07-29 MED ORDER — PALONOSETRON HCL INJECTION 0.25 MG/5ML
0.2500 mg | Freq: Once | INTRAVENOUS | Status: AC
  Administered 2016-07-29: 0.25 mg via INTRAVENOUS
  Filled 2016-07-29: qty 5

## 2016-07-29 MED ORDER — POTASSIUM CHLORIDE 2 MEQ/ML IV SOLN
Freq: Once | INTRAVENOUS | Status: AC
  Administered 2016-07-29: 10:00:00 via INTRAVENOUS
  Filled 2016-07-29: qty 1000

## 2016-07-29 MED ORDER — SODIUM CHLORIDE 0.9 % IV SOLN
Freq: Once | INTRAVENOUS | Status: AC
  Administered 2016-07-29: 10:00:00 via INTRAVENOUS
  Filled 2016-07-29: qty 1000

## 2016-07-29 MED ORDER — FOSAPREPITANT DIMEGLUMINE INJECTION 150 MG
Freq: Once | INTRAVENOUS | Status: AC
  Administered 2016-07-29: 13:00:00 via INTRAVENOUS
  Filled 2016-07-29: qty 5

## 2016-07-29 MED ORDER — SODIUM CHLORIDE 0.9 % IV SOLN
100.0000 mg/m2 | Freq: Once | INTRAVENOUS | Status: AC
  Administered 2016-07-29: 214 mg via INTRAVENOUS
  Filled 2016-07-29: qty 214

## 2016-07-29 NOTE — Progress Notes (Signed)
Nutrition Assessment   Reason for Assessment:   Referral received from MD [facility] for nutrition counseling  ASSESSMENT:  50 year old male with new diagnosis of left base of tongue/tonsilar cancer, HPV +.  Patient seen during infusion this am.   Past medical history includes asthma  Patient reports no nausea, vomiting, or mucositis currently.  Receiving cycle 2 of cisplatin today.  Reports normal bowel movement.  Reports that he is eating 60% of diet coming from complex carbohydrates (fruits, vegetables, whole grains, etc), 20% protein and 20% fat.  Patient very knowledgeable about nutrition. Reports that he has eaten healthy most of his life and reports feels bad when he eats fast food.  Reports that he has been and continues to be active to maintain muscle mass during treatment.  Medications: MVI, melatonin, B complex, Vit D  Labs: glucose 103  Anthropometrics:   Height: 73 inches Weight: 190 lb 3.2oz UBW: 180 lb (reports gained weight prior to treatment) BMI: 25   Estimated Energy Needs  Kcals: 2150-2580 kcals/d Protein: 86-129 g/d Fluid: 2.5 L/d  NUTRITION DIAGNOSIS: Food and nutrition related knowledge deficit related to new diagnosis of cancer as evidenced by questions regarding nutrition and supplements for cancer     INTERVENTION:   Answered patient's questions regarding supplements. Encouraged patient to discuss all use of supplements with MD and not to start supplements without talking to MD first as currently receiving chemotherapy. Encouraged eating of whole foods vs taking supplement.  Encouraged safe activity as tolerated and approved by MD to maintain muscle mass.    MONITORING, EVALUATION, GOAL: Patient will continue to consume adequate calories and protein to maintain weight and muscle mass during treatment   NEXT VISIT: December 21 during infusion  Lavergne Hiltunen B. Zenia Resides, [place], [place] (pager)

## 2016-07-29 NOTE — Assessment & Plan Note (Addendum)
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 S/p cycle #1. Tolerating well without any major side effects.  # proceed with cycle #2; labs okay.    # discussed re: nutrition- meeting with dietician today.   # follow up in 3 weeks- MD/labs weekly

## 2016-07-29 NOTE — Progress Notes (Signed)
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 Tonsillar uptake L>R; OCT 2017- Left Tonsil POSITIVE   # Low testosterone [on testosterone shots]     Squamous cell carcinoma of head and neck ([place])   05/18/2016 Initial Diagnosis    Squamous cell carcinoma of head and neck (HCC)       HISTORY OF PRESENTING ILLNESS:  Philip Cordova 50 y.o.  male with newly diagnosed squamous cell carcinoma S/P positive currently on definitive chemoradiation. Patient status post cycle #1 of cisplatin approximately 3 weeks ago.   He denies any soreness the mall. Denies any tingling or numbness. Denies any difficulty swallowing. Denies any ringing in the ears. Appetite is good. Continues to complain of chronic difficulty sleeping at night.  He is very anxious about the diet.   ROS: A complete 10 point review of system is done which is negative except mentioned above in history of present illness  MEDICAL HISTORY:  Past Medical History:  Diagnosis Date  . Asthma   . 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 Cancer ([place])     SURGICAL HISTORY: Past Surgical History:  Procedure Laterality Date  . APPENDECTOMY  2008  . DIAGNOSTIC LARYNGOSCOPY  06/17/2016   Procedure: DIAGNOSTIC LARYNGOSCOPY WITH BIOPSIES;  Surgeon: Carloyn Manner, MD;  Location: [facility] ORS;  Service: ENT;;  . Moles removed  2005  . RHINOPLASTY  1990  . 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  Lives in [place].   Social History   Social History  . Marital status: Single    Spouse name: N/A   . Number of children: N/A  . Years of education: N/A   Occupational History  . Not on file.   Social History Main Topics  . Smoking status: Former Smoker    Years: 1.00    Types: Cigarettes  . Smokeless tobacco: Former Systems developer     Comment: "YEARS AGO"  . Alcohol use No     Comment: Occasionally  . Drug use: No  . Sexual activity: Not on file   Other Topics Concern  . Not on file   Social History Narrative  . No narrative on file    FAMILY HISTORY:  Family History  Problem Relation Age of Onset  . Cancer Father     Skin  . Hyperlipidemia Father   . Hypertension Father     Possible  . Diabetes Paternal Uncle     ALLERGIES:  is allergic to milk-related compounds.  MEDICATIONS:  Current Outpatient Prescriptions  Medication Sig Dispense Refill  . albuterol (PROVENTIL HFA;VENTOLIN HFA) 108 (90 Base) MCG/ACT inhaler Inhale 2 puffs into the lungs every 6 (six) hours as needed for wheezing or shortness of breath.    Marland Kitchen aspirin EC 81 MG tablet Take 81 mg by mouth daily.    . B Complex-Biotin-FA (SUPER B-100) TABS Take 1 tablet by mouth daily.    . calcium carbonate 100 mg/ml SUSP     . Ergocalciferol (VITAMIN D2 PO)     . fluticasone (VERAMYST) 27.5 MCG/SPRAY nasal  spray Place 2 sprays into the nose daily.    . Fluticasone-Salmeterol (ADVAIR) 250-50 MCG/DOSE AEPB Inhale 1 puff into the lungs every morning.     Marland Kitchen GLUCOSAMINE-CHONDROITIN PO Take by mouth.    . Melatonin 10 MG TABS Take 1 tablet by mouth at bedtime.    Marland Kitchen MELATONIN PO Take 10 mg by mouth daily.    . Multiple Vitamins-Minerals (MULTIVITAMIN WITH MINERALS) tablet Take by mouth.    . ondansetron (ZOFRAN) 8 MG tablet One pill every 8 hours as needed for nausea/ vomitting. 40 tablet 1  . sucralfate (CARAFATE) 1 g tablet Take 1 tablet (1 g total) by mouth 3 (three) times daily. Dissolve in 2-3 tbsp warm water, swish and swallow 90 tablet 3  . docusate sodium (COLACE) 100 MG capsule Take 1 capsule (100 mg total) by  mouth 2 (two) times daily. (Patient not taking: Reported on 07/29/2016) 40 capsule 0  . prochlorperazine (COMPAZINE) 10 MG tablet Take 1 tablet (10 mg total) by mouth every 6 (six) hours as needed for nausea or vomiting. (Patient not taking: Reported on 07/29/2016) 40 tablet 1   No current facility-administered medications for this visit.       Marland Kitchen  PHYSICAL EXAMINATION: ECOG PERFORMANCE STATUS: 0 - Asymptomatic  Vitals:   07/29/16 0919  BP: (!) 150/93  Pulse: 84  Resp: 18  Temp: 97.8 F (36.6 C)   Filed Weights   07/29/16 0919  Weight: 190 lb 3.2 oz (86.3 kg)    GENERAL: Well-nourished well-developed; Alert, no distress and comfortable. He is alone. EYES: no pallor or icterus OROPHARYNX: no thrush or ulceration; good dentition  NECK: supple, no masses felt LYMPH:  no palpable lymphadenopathy in the axillary or inguinal regions; appx ~2cm LN felt in left submandibular region.  LUNGS: clear to auscultation and  No wheeze or crackles HEART/CVS: regular rate & rhythm and no murmurs; No lower extremity edema ABDOMEN: abdomen soft, non-tender and normal bowel sounds Musculoskeletal:no cyanosis of digits and no clubbing  PSYCH: alert & oriented x 3 with fluent speech NEURO: no focal motor/sensory deficits SKIN:  no rashes or significant lesions  LABORATORY DATA:  I have reviewed the data as listed Lab Results  Component Value Date   WBC 3.8 07/29/2016   HGB 12.2 (L) 07/29/2016   HCT 35.5 (L) 07/29/2016   MCV 93.4 07/29/2016   PLT 274 07/29/2016    Recent Labs  07/08/16 0910 07/19/16 0824 07/29/16 0858  NA 137 138 139  K 3.5 3.9 3.9  CL 103 99* 100*  CO2 29 30 32  GLUCOSE 100* 108* 103*  BUN 21* 15 20  CREATININE 0.93 0.94 0.85  CALCIUM 9.3 9.1 9.4  GFRNONAA >60 >60 >60  GFRAA >60 >60 >60  PROT 7.3 7.0 7.0  ALBUMIN 4.1 4.0 3.9  AST 20 29 24   ALT 20 34 27  ALKPHOS 57 52 55  BILITOT 0.6 0.4 0.6    RADIOGRAPHIC STUDIES: I have personally reviewed the  radiological images as listed and agreed with the findings in the report. No results found.  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 S/p cycle #1. Tolerating well without any major side effects.  # proceed with cycle #2; labs okay.    # discussed re: nutrition- meeting with dietician today.   # follow  up in 3 weeks- MD/labs weekly   Cammie Sickle, MD 07/29/2016 6:09 PM

## 2016-07-30 ENCOUNTER — Ambulatory Visit: Payer: Self-pay

## 2016-07-30 ENCOUNTER — Ambulatory Visit
Admission: RE | Admit: 2016-07-30 | Discharge: 2016-07-30 | Disposition: A | Discharge status: Home / Self Care | Payer: Self-pay | Source: Ambulatory Visit | Attending: Radiation Oncology | Admitting: Radiation Oncology

## 2016-07-30 ENCOUNTER — Inpatient Hospital Stay: Payer: Self-pay

## 2016-08-02 ENCOUNTER — Ambulatory Visit: Admission: RE | Admit: 2016-08-02 | Payer: Self-pay | Source: Ambulatory Visit

## 2016-08-02 ENCOUNTER — Ambulatory Visit: Payer: Self-pay

## 2016-08-03 ENCOUNTER — Ambulatory Visit: Payer: Self-pay

## 2016-08-04 ENCOUNTER — Ambulatory Visit: Payer: Self-pay

## 2016-08-05 ENCOUNTER — Inpatient Hospital Stay: Payer: Self-pay | Attending: Internal Medicine

## 2016-08-05 ENCOUNTER — Ambulatory Visit
Admission: RE | Admit: 2016-08-05 | Discharge: 2016-08-05 | Disposition: A | Discharge status: Home / Self Care | Payer: Self-pay | Source: Ambulatory Visit | Attending: Radiation Oncology | Admitting: Radiation Oncology

## 2016-08-05 ENCOUNTER — Telehealth: Payer: Self-pay | Admitting: *Deleted

## 2016-08-05 DIAGNOSIS — Z87891 Personal history of nicotine dependence: Secondary | ICD-10-CM | POA: Insufficient documentation

## 2016-08-05 DIAGNOSIS — J45909 Unspecified asthma, uncomplicated: Secondary | ICD-10-CM | POA: Insufficient documentation

## 2016-08-05 DIAGNOSIS — G4701 Insomnia due to medical condition: Secondary | ICD-10-CM | POA: Insufficient documentation

## 2016-08-05 DIAGNOSIS — C099 Malignant neoplasm of tonsil, unspecified: Secondary | ICD-10-CM | POA: Insufficient documentation

## 2016-08-05 DIAGNOSIS — R634 Abnormal weight loss: Secondary | ICD-10-CM | POA: Insufficient documentation

## 2016-08-05 DIAGNOSIS — A630 Anogenital (venereal) warts: Secondary | ICD-10-CM | POA: Insufficient documentation

## 2016-08-05 DIAGNOSIS — K1230 Oral mucositis (ulcerative), unspecified: Secondary | ICD-10-CM | POA: Insufficient documentation

## 2016-08-05 DIAGNOSIS — E871 Hypo-osmolality and hyponatremia: Secondary | ICD-10-CM | POA: Insufficient documentation

## 2016-08-05 DIAGNOSIS — R432 Parageusia: Secondary | ICD-10-CM | POA: Insufficient documentation

## 2016-08-05 DIAGNOSIS — Z7982 Long term (current) use of aspirin: Secondary | ICD-10-CM | POA: Insufficient documentation

## 2016-08-05 DIAGNOSIS — E860 Dehydration: Secondary | ICD-10-CM | POA: Insufficient documentation

## 2016-08-05 DIAGNOSIS — Z5111 Encounter for antineoplastic chemotherapy: Secondary | ICD-10-CM | POA: Insufficient documentation

## 2016-08-05 DIAGNOSIS — Z79899 Other long term (current) drug therapy: Secondary | ICD-10-CM | POA: Insufficient documentation

## 2016-08-05 DIAGNOSIS — C760 Malignant neoplasm of head, face and neck: Secondary | ICD-10-CM

## 2016-08-05 LAB — CBC WITH DIFFERENTIAL/PLATELET
Basophils Absolute: 0.1 10*3/uL (ref 0–0.1)
Basophils Relative: 1 %
Eosinophils Absolute: 0.1 10*3/uL (ref 0–0.7)
Eosinophils Relative: 2 %
HCT: 34.9 % — ABNORMAL LOW (ref 40.0–52.0)
Hemoglobin: 12.1 g/dL — ABNORMAL LOW (ref 13.0–18.0)
Lymphocytes Relative: 13 %
Lymphs Abs: 0.7 10*3/uL — ABNORMAL LOW (ref 1.0–3.6)
MCH: 32.2 pg (ref 26.0–34.0)
MCHC: 34.8 g/dL (ref 32.0–36.0)
MCV: 92.7 fL (ref 80.0–100.0)
Monocytes Absolute: 1.1 10*3/uL — ABNORMAL HIGH (ref 0.2–1.0)
Monocytes Relative: 20 %
Neutro Abs: 3.6 10*3/uL (ref 1.4–6.5)
Neutrophils Relative %: 64 %
Platelets: 233 10*3/uL (ref 150–440)
RBC: 3.77 MIL/uL — ABNORMAL LOW (ref 4.40–5.90)
RDW: 13.6 % (ref 11.5–14.5)
WBC: 5.5 10*3/uL (ref 3.8–10.6)

## 2016-08-05 LAB — BASIC METABOLIC PANEL WITH GFR
Anion gap: 7 (ref 5–15)
BUN: 24 mg/dL — ABNORMAL HIGH (ref 6–20)
CO2: 28 mmol/L (ref 22–32)
Calcium: 9.3 mg/dL (ref 8.9–10.3)
Chloride: 96 mmol/L — ABNORMAL LOW (ref 101–111)
Creatinine, Ser: 0.84 mg/dL (ref 0.61–1.24)
GFR calc Af Amer: >60 mL/min
GFR calc non Af Amer: >60 mL/min
Glucose, Bld: 99 mg/dL (ref 65–99)
Potassium: 4 mmol/L (ref 3.5–5.1)
Sodium: 131 mmol/L — ABNORMAL LOW (ref 135–145)

## 2016-08-05 NOTE — Telephone Encounter (Signed)
Called to ask what he can take for his runny nose as he has had a cold for 3 days. Advised to try Claritin or Zyrtec. Does not feel he is fevered. He will call back if temp goes over 100.5. He discussed that he does not sleep well and this is a chronic problem, was assking if we could give him something to sleep. He takes Melatonin nightly, has taken 5 Benadryl at once and he has tried Ambien and still does not sleep more than 4 hours. He will discuss this with nursing tomorrow when he comes in for appt

## 2016-08-06 ENCOUNTER — Ambulatory Visit
Admission: RE | Admit: 2016-08-06 | Discharge: 2016-08-06 | Disposition: A | Discharge status: Home / Self Care | Payer: Self-pay | Source: Ambulatory Visit | Attending: Radiation Oncology | Admitting: Radiation Oncology

## 2016-08-06 ENCOUNTER — Other Ambulatory Visit: Payer: Self-pay | Admitting: *Deleted

## 2016-08-06 ENCOUNTER — Ambulatory Visit: Payer: Self-pay

## 2016-08-06 MED ORDER — DEXAMETHASONE 4 MG PO TABS: 4.0000 mg | ORAL_TABLET | Freq: Two times a day (BID) | ORAL | 0 refills | Status: DC

## 2016-08-09 ENCOUNTER — Ambulatory Visit
Admission: RE | Admit: 2016-08-09 | Discharge: 2016-08-09 | Disposition: A | Discharge status: Home / Self Care | Payer: Self-pay | Source: Ambulatory Visit | Attending: Radiation Oncology | Admitting: Radiation Oncology

## 2016-08-10 ENCOUNTER — Ambulatory Visit
Admission: RE | Admit: 2016-08-10 | Discharge: 2016-08-10 | Disposition: A | Discharge status: Home / Self Care | Payer: Self-pay | Source: Ambulatory Visit | Attending: Radiation Oncology | Admitting: Radiation Oncology

## 2016-08-10 ENCOUNTER — Encounter: Payer: Self-pay | Admitting: Internal Medicine

## 2016-08-11 ENCOUNTER — Ambulatory Visit
Admission: RE | Admit: 2016-08-11 | Discharge: 2016-08-11 | Disposition: A | Discharge status: Home / Self Care | Payer: Self-pay | Source: Ambulatory Visit | Attending: Radiation Oncology | Admitting: Radiation Oncology

## 2016-08-12 ENCOUNTER — Inpatient Hospital Stay: Payer: Self-pay

## 2016-08-12 ENCOUNTER — Ambulatory Visit
Admission: RE | Admit: 2016-08-12 | Discharge: 2016-08-12 | Disposition: A | Discharge status: Home / Self Care | Payer: Self-pay | Source: Ambulatory Visit | Attending: Radiation Oncology | Admitting: Radiation Oncology

## 2016-08-12 DIAGNOSIS — C760 Malignant neoplasm of head, face and neck: Secondary | ICD-10-CM

## 2016-08-12 LAB — CBC WITH DIFFERENTIAL/PLATELET
BASOS ABS: 0 10*3/uL (ref 0–0.1)
BASOS PCT: 0 %
EOS PCT: 0 %
Eosinophils Absolute: 0 10*3/uL (ref 0–0.7)
HCT: 33.5 % — ABNORMAL LOW (ref 40.0–52.0)
Hemoglobin: 11.5 g/dL — ABNORMAL LOW (ref 13.0–18.0)
Lymphocytes Relative: 3 %
Lymphs Abs: 0.3 10*3/uL — ABNORMAL LOW (ref 1.0–3.6)
MCH: 32.2 pg (ref 26.0–34.0)
MCHC: 34.3 g/dL (ref 32.0–36.0)
MCV: 93.8 fL (ref 80.0–100.0)
MONO ABS: 0.3 10*3/uL (ref 0.2–1.0)
Monocytes Relative: 3 %
NEUTROS ABS: 11.7 10*3/uL — AB (ref 1.4–6.5)
Neutrophils Relative %: 94 %
PLATELETS: 159 10*3/uL (ref 150–440)
RBC: 3.57 MIL/uL — ABNORMAL LOW (ref 4.40–5.90)
RDW: 14.1 % (ref 11.5–14.5)
WBC: 12.4 10*3/uL — ABNORMAL HIGH (ref 3.8–10.6)

## 2016-08-12 LAB — BASIC METABOLIC PANEL
ANION GAP: 7 (ref 5–15)
BUN: 25 mg/dL — ABNORMAL HIGH (ref 6–20)
CALCIUM: 9.3 mg/dL (ref 8.9–10.3)
CO2: 28 mmol/L (ref 22–32)
Chloride: 97 mmol/L — ABNORMAL LOW (ref 101–111)
Creatinine, Ser: 0.85 mg/dL (ref 0.61–1.24)
Glucose, Bld: 146 mg/dL — ABNORMAL HIGH (ref 65–99)
Potassium: 4 mmol/L (ref 3.5–5.1)
Sodium: 132 mmol/L — ABNORMAL LOW (ref 135–145)

## 2016-08-13 ENCOUNTER — Telehealth: Payer: Self-pay | Admitting: *Deleted

## 2016-08-13 ENCOUNTER — Encounter: Payer: Self-pay | Admitting: *Deleted

## 2016-08-13 ENCOUNTER — Other Ambulatory Visit: Payer: Self-pay | Admitting: *Deleted

## 2016-08-13 ENCOUNTER — Ambulatory Visit
Admission: RE | Admit: 2016-08-13 | Discharge: 2016-08-13 | Disposition: A | Discharge status: Home / Self Care | Payer: Self-pay | Source: Ambulatory Visit | Attending: Radiation Oncology | Admitting: Radiation Oncology

## 2016-08-13 NOTE — Telephone Encounter (Signed)
Pt notified Philip Cordova in radiation therapy dept that he would like a RF on decadron.  RN spoke with Dr. Rogue Bussing. Md does not recommend RF on decadron. Would like to prescribe medicated magic mouthwash.  This RX was sent into pt's pharmacy to [facility] at 57 am. Wilhemena Durie will inform pt when he comes into clinic today for radiation therapy.

## 2016-08-16 ENCOUNTER — Ambulatory Visit
Admission: RE | Admit: 2016-08-16 | Discharge: 2016-08-16 | Disposition: A | Discharge status: Home / Self Care | Payer: Self-pay | Source: Ambulatory Visit | Attending: Radiation Oncology | Admitting: Radiation Oncology

## 2016-08-17 ENCOUNTER — Ambulatory Visit
Admission: RE | Admit: 2016-08-17 | Discharge: 2016-08-17 | Disposition: A | Discharge status: Home / Self Care | Payer: Self-pay | Source: Ambulatory Visit | Attending: Radiation Oncology | Admitting: Radiation Oncology

## 2016-08-18 ENCOUNTER — Telehealth: Payer: Self-pay | Admitting: *Deleted

## 2016-08-18 ENCOUNTER — Ambulatory Visit
Admission: RE | Admit: 2016-08-18 | Discharge: 2016-08-18 | Disposition: A | Discharge status: Home / Self Care | Payer: Self-pay | Source: Ambulatory Visit | Attending: Radiation Oncology | Admitting: Radiation Oncology

## 2016-08-18 NOTE — Telephone Encounter (Signed)
Patient called to say he had burned his mouth with salt. Per Dr. B patient should try Magic Mouth Wash. Patient states he has tried it and it did not work. He refused a new prescription. Patient was advised to try Tylenol for pain per instructions on the package.

## 2016-08-19 ENCOUNTER — Inpatient Hospital Stay (HOSPITAL_BASED_OUTPATIENT_CLINIC_OR_DEPARTMENT_OTHER): Payer: Self-pay | Admitting: Oncology

## 2016-08-19 ENCOUNTER — Encounter: Payer: Self-pay | Admitting: Oncology

## 2016-08-19 ENCOUNTER — Ambulatory Visit: Payer: Self-pay

## 2016-08-19 ENCOUNTER — Other Ambulatory Visit: Payer: Self-pay | Admitting: Oncology

## 2016-08-19 ENCOUNTER — Inpatient Hospital Stay: Payer: Self-pay

## 2016-08-19 VITALS — BP 115/75 | HR 71 | Temp 96.7°F | Resp 18 | Ht 73.0 in | Wt 190.0 lb

## 2016-08-19 DIAGNOSIS — K1230 Oral mucositis (ulcerative), unspecified: Secondary | ICD-10-CM

## 2016-08-19 DIAGNOSIS — C760 Malignant neoplasm of head, face and neck: Secondary | ICD-10-CM

## 2016-08-19 DIAGNOSIS — Z87891 Personal history of nicotine dependence: Secondary | ICD-10-CM

## 2016-08-19 DIAGNOSIS — G4700 Insomnia, unspecified: Secondary | ICD-10-CM

## 2016-08-19 DIAGNOSIS — C099 Malignant neoplasm of tonsil, unspecified: Secondary | ICD-10-CM

## 2016-08-19 DIAGNOSIS — Z79899 Other long term (current) drug therapy: Secondary | ICD-10-CM

## 2016-08-19 DIAGNOSIS — A630 Anogenital (venereal) warts: Secondary | ICD-10-CM

## 2016-08-19 LAB — CBC WITH DIFFERENTIAL/PLATELET
Basophils Absolute: 0 10*3/uL (ref 0–0.1)
Basophils Relative: 0 %
EOS ABS: 0 10*3/uL (ref 0–0.7)
Eosinophils Relative: 0 %
HCT: 39.5 % — ABNORMAL LOW (ref 40.0–52.0)
Hemoglobin: 13.8 g/dL (ref 13.0–18.0)
Lymphocytes Relative: 5 %
Lymphs Abs: 0.2 10*3/uL — ABNORMAL LOW (ref 1.0–3.6)
MCH: 33.4 pg (ref 26.0–34.0)
MCHC: 34.8 g/dL (ref 32.0–36.0)
MCV: 96 fL (ref 80.0–100.0)
MONO ABS: 0.1 10*3/uL — AB (ref 0.2–1.0)
Monocytes Relative: 3 %
Neutro Abs: 4.2 10*3/uL (ref 1.4–6.5)
Neutrophils Relative %: 92 %
Platelets: 207 10*3/uL (ref 150–440)
RBC: 4.12 MIL/uL — ABNORMAL LOW (ref 4.40–5.90)
RDW: 15.4 % — AB (ref 11.5–14.5)
WBC: 4.5 10*3/uL (ref 3.8–10.6)

## 2016-08-19 LAB — COMPREHENSIVE METABOLIC PANEL
ALT: 30 U/L (ref 17–63)
AST: 25 U/L (ref 15–41)
Albumin: 4.1 g/dL (ref 3.5–5.0)
Alkaline Phosphatase: 56 U/L (ref 38–126)
Anion gap: 8 (ref 5–15)
BUN: 34 mg/dL — ABNORMAL HIGH (ref 6–20)
CHLORIDE: 95 mmol/L — AB (ref 101–111)
CO2: 26 mmol/L (ref 22–32)
CREATININE: 0.85 mg/dL (ref 0.61–1.24)
Calcium: 9.3 mg/dL (ref 8.9–10.3)
GFR calc Af Amer: >60 mL/min (ref >60)
GLUCOSE: 119 mg/dL — AB (ref 65–99)
Potassium: 4.2 mmol/L (ref 3.5–5.1)
Sodium: 129 mmol/L — ABNORMAL LOW (ref 135–145)
Total Bilirubin: 0.6 mg/dL (ref 0.3–1.2)
Total Protein: 7.5 g/dL (ref 6.5–8.1)

## 2016-08-19 MED ORDER — VALACYCLOVIR HCL 1 G PO TABS: 1000.0000 mg | ORAL_TABLET | Freq: Two times a day (BID) | ORAL | 0 refills | Status: DC

## 2016-08-19 MED ORDER — PALONOSETRON HCL INJECTION 0.25 MG/5ML
0.2500 mg | Freq: Once | INTRAVENOUS | Status: AC
  Administered 2016-08-19: 0.25 mg via INTRAVENOUS
  Filled 2016-08-19: qty 5

## 2016-08-19 MED ORDER — FOSAPREPITANT DIMEGLUMINE INJECTION 150 MG
Freq: Once | INTRAVENOUS | Status: AC
  Administered 2016-08-19: 14:00:00 via INTRAVENOUS
  Filled 2016-08-19: qty 5

## 2016-08-19 MED ORDER — OXYCODONE HCL 10 MG/0.5ML PO CONC: 0.2500 mL | ORAL | 0 refills | Status: DC | PRN

## 2016-08-19 MED ORDER — ZOLPIDEM TARTRATE 5 MG PO TABS: 5.0000 mg | ORAL_TABLET | Freq: Every evening | ORAL | 0 refills | Status: DC | PRN

## 2016-08-19 MED ORDER — SODIUM CHLORIDE 0.9 % IV SOLN
100.0000 mg/m2 | Freq: Once | INTRAVENOUS | Status: AC
  Administered 2016-08-19: 214 mg via INTRAVENOUS
  Filled 2016-08-19: qty 214

## 2016-08-19 MED ORDER — MANNITOL 25 % IV SOLN
Freq: Once | INTRAVENOUS | Status: AC
  Administered 2016-08-19: 12:00:00 via INTRAVENOUS
  Filled 2016-08-19: qty 10

## 2016-08-19 MED ORDER — OXYCODONE HCL 5 MG/5ML PO SOLN
5.0000 mg | ORAL | 0 refills | Status: DC | PRN
Start: 2016-08-19 — End: 2016-08-31

## 2016-08-19 NOTE — Progress Notes (Signed)
Nutrition Follow-up:  Patient seen for nutrition follow-up during infusion for tongue/tonsilar cancer.    Noted patient with sore mouth especially after eating pork rinds.  Patient reports that he is limited carbohydrates due to it causing bitter taste in mouth. Still eating rice, dried beans, pasta as sources of carbohydrate and drinking equate brand shake that contains carbohydrate.  Patient reports eating well and exercising. Eating a little more sodium that usual to increase blood level.  No new weight taken today   Medications: reviewed  Labs: reviewed   NUTRITION DIAGNOSIS: Food and nutrition related knowledge deficit improved   INTERVENTION:   Discussed importance of continuing to eat complex carbohydrates as brain prefers energy from carbohydrate.   Provided handouts on strategies for sore mouth and taste alterations.      MONITORING, EVALUATION, GOAL: Patient will continue to consume adequate calories and protein to maintain weight and muscle mass during treatment.    NEXT VISIT: as needed  Lasaundra Riche B. Zenia Resides, [place], [place] (pager)

## 2016-08-19 NOTE — Progress Notes (Signed)
Pt had another herpes outbreak and would like valcyclovir, and ambien to try again for sleep. Sleep is a big problem for pt.

## 2016-08-19 NOTE — Progress Notes (Signed)
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  Chief complaint/ Reason for visit- ongoing chemotherapy for HPV-positive squamous cell carcinoma of the head and neck here for assessment prior to cycle #3 of cisplatin  Heme/Onc history: Patient is a 50 year old male who was diagnosed with HPV-positive squamous cell carcinoma of the left neck/submandibular lymph node to 2.5 cm in size. He had a right neck node that was 7 mm in size. PET CT scan on September 22 showed uptake in the left neck and possible uptake in the left tonsil. Left tonsillar biopsy was positive for squamous cell carcinoma. He is currently receiving definitive chemoradiation and has completed doses of every 3 weeks cisplatin 100 mg/m   Interval history- patient continues to follow a strict regimen for his diet and mainly eats a bland diet and avoid salt and carbohydrates. He has been drinking plenty of fluids. He ate some pork rinds ye and reports that his mouth is sore since thensterday.  he is also keeping up with the physical activity and walks about 7 miles per day. He does report that his mouth pain is currently not controlled with topical measures and Tylenol. He is also not been sleeping well and gets about 3-5 hours of sleep on an average. Say that he sat in a public toilet seat for the first time and has developed lesions over his bilateral but similar to the [place] herpes that he had years ago. He will not be getting his radiation today and restart it tomorrow and end on 09/04/2016.    ECOG PS- 0 Pain scale- 6 Opioid associated constipation- no  Review of systems-  Review of Systems  Constitutional: Negative for chills, fever, malaise/fatigue and weight loss.  HENT: Negative for congestion, ear discharge and nosebleeds.        Positive for mouth pain  Eyes: Negative for blurred vision.  Respiratory: Negative for cough, hemoptysis, sputum production, shortness of breath and wheezing.   Cardiovascular: Negative for chest pain, palpitations, orthopnea and claudication.  Gastrointestinal: Negative for abdominal pain, blood in stool, constipation, diarrhea, heartburn, melena, nausea and vomiting.  Genitourinary: Negative for dysuria, flank pain, frequency, hematuria and urgency.       Lesions over bilateral buttocks  Musculoskeletal: Negative for back pain, joint pain and myalgias.  Skin: Negative for rash.  Neurological: Negative for dizziness, tingling, focal weakness, seizures, weakness and headaches.  Endo/Heme/Allergies: Does not bruise/bleed easily.  Psychiatric/Behavioral: Negative for depression and suicidal ideas. The patient has insomnia.      Current treatment- definitive chemoradiation status post 2 cycles of every 3 weeks cisplatin 100 mg/m  Allergies  Allergen Reactions  . Milk-Related Compounds      Past Medical History:  Diagnosis Date  . Asthma   . 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 Cancer ([place])      Past Surgical History:  Procedure Laterality Date  . APPENDECTOMY  2008  . DIAGNOSTIC LARYNGOSCOPY  06/17/2016   Procedure: DIAGNOSTIC LARYNGOSCOPY WITH BIOPSIES;  Surgeon: Carloyn Manner, MD;  Location: [facility] ORS;  Service: ENT;;  . Moles removed  2005  . RHINOPLASTY  1990  . TONSILLECTOMY N/A  06/17/2016   Procedure: TONSILLECTOMY;  Surgeon: Carloyn Manner, MD;  Location: [facility] ORS;  Service: ENT;  Laterality: N/A;    Social History   Social History  . Marital status: Single    Spouse name: N/A  . Number of children: N/A  . Years of education: N/A   Occupational History  . Not on file.   Social  History Main Topics  . Smoking status: Former Smoker    Years: 1.00    Types: Cigarettes  . Smokeless tobacco: Former Systems developer     Comment: "YEARS AGO"  . Alcohol use No     Comment: Occasionally  . Drug use: No  . Sexual activity: Not on file   Other Topics Concern  . Not on file   Social History Narrative  . No narrative on file    Family History  Problem Relation Age of Onset  . Cancer Father     Skin  . Hyperlipidemia Father   . Hypertension Father     Possible  . Diabetes Paternal Uncle      Current Outpatient Prescriptions:  .  albuterol (PROVENTIL HFA;VENTOLIN HFA) 108 (90 Base) MCG/ACT inhaler, Inhale 2 puffs into the lungs every 6 (six) hours as needed for wheezing or shortness of breath., Disp: , Rfl:  .  aspirin EC 81 MG tablet, Take 81 mg by mouth daily., Disp: , Rfl:  .  B Complex-Biotin-FA (SUPER B-100) TABS, Take 1 tablet by mouth daily., Disp: , Rfl:  .  calcium carbonate 100 mg/ml SUSP, , Disp: , Rfl:  .  dexamethasone (DECADRON) 4 MG tablet, Take 1 tablet (4 mg total) by mouth 2 (two) times daily with a meal., Disp: 14 tablet, Rfl: 0 .  docusate sodium (COLACE) 100 MG capsule, Take 1 capsule (100 mg total) by mouth 2 (two) times daily. (Patient not taking: Reported on 07/29/2016), Disp: 40 capsule, Rfl: 0 .  Ergocalciferol (VITAMIN D2 PO), , Disp: , Rfl:  .  fluticasone (VERAMYST) 27.5 MCG/SPRAY nasal spray, Place 2 sprays into the nose daily., Disp: , Rfl:  .  Fluticasone-Salmeterol (ADVAIR) 250-50 MCG/DOSE AEPB, Inhale 1 puff into the lungs every morning. , Disp: , Rfl:  .  GLUCOSAMINE-CHONDROITIN PO, Take by mouth., Disp: , Rfl:  .  Melatonin 10 MG TABS, Take 1 tablet by mouth at bedtime., Disp: , Rfl:  .  MELATONIN PO, Take 10 mg by mouth daily., Disp: , Rfl:  .  mucosal barrier oral (GELCLAIR) GEL, Take 1 packet by mouth 2 (two) times daily., Disp: , Rfl:  .  Multiple Vitamins-Minerals (MULTIVITAMIN WITH MINERALS) tablet, Take by mouth., Disp: , Rfl:  .   ondansetron (ZOFRAN) 8 MG tablet, One pill every 8 hours as needed for nausea/ vomitting., Disp: 40 tablet, Rfl: 1 .  prochlorperazine (COMPAZINE) 10 MG tablet, Take 1 tablet (10 mg total) by mouth every 6 (six) hours as needed for nausea or vomiting. (Patient not taking: Reported on 07/29/2016), Disp: 40 tablet, Rfl: 1 .  sucralfate (CARAFATE) 1 g tablet, Take 1 tablet (1 g total) by mouth 3 (three) times daily. Dissolve in 2-3 tbsp warm water, swish and swallow, Disp: 90 tablet, Rfl: 3  Physical exam:  Vitals:   08/19/16 1318  BP: 115/75  Pulse: 71  Resp: 18  Temp: (!) 96.7 F (35.9 C)  TempSrc: Tympanic  Weight: 190 lb 0.6 oz (86.2 kg)  Height: 6\' 1"  (1.854 m)   Physical Exam  Constitutional: He is oriented to person, place, and  time and well-developed, well-nourished, and in no distress.  HENT:  Head: Normocephalic and atraumatic.  Grade 1 mucositis mainly seen over the posterior pharyngeal wall. There is no evidence of any open ulcerations or discharge  Eyes: EOM are normal. Pupils are equal, round, and reactive to light.  Neck: Normal range of motion.  Cardiovascular: Normal rate, regular rhythm and normal heart sounds.   Pulmonary/Chest: Effort normal and breath sounds normal.  Abdominal: Soft. Bowel sounds are normal.  Lymphadenopathy:    He has cervical adenopathy (Palpable solitary left cervical adenopathy about 2 cm in size).  Neurological: He is alert and oriented to person, place, and time.  Skin: Skin is warm and dry.     CMP Latest Ref Rng & Units 08/12/2016  Glucose 65 - 99 mg/dL 146(H)  BUN 6 - 20 mg/dL 25(H)  Creatinine 0.61 - 1.24 mg/dL 0.85  Sodium 135 - 145 mmol/L 132(L)  Potassium 3.5 - 5.1 mmol/L 4.0  Chloride 101 - 111 mmol/L 97(L)  CO2 22 - 32 mmol/L 28  Calcium 8.9 - 10.3 mg/dL 9.3  Total Protein 6.5 - 8.1 g/dL -  Total Bilirubin 0.3 - 1.2 mg/dL -  Alkaline Phos 38 - 126 U/L -  AST 15 - 41 U/L -  ALT 17 - 63 U/L -   CBC Latest Ref Rng & Units  08/19/2016  WBC 3.8 - 10.6 K/uL 4.5  Hemoglobin 13.0 - 18.0 g/dL 13.8  Hematocrit 40.0 - 52.0 % 39.5(L)  Platelets 150 - 440 K/uL 207     Assessment and plan- Patient is a 50 y.o. male with a history of HPV-positive squamous cell Return to clinic in 1 week with CBC and CMP for visitarcinoma of the left tonsil/left neck and currently undergoing definitive chemoradiation and is status post 2 cycles of every 3 weeks cisplatin at 100 mg/m your for assessment prior to cycle #3 of cisplatin  1. Okay to proceed with last dose of cisplatin at full dose at 100 mg/meter square days 1 counts today  2. Mucositis and pain from chemo/RT- will start oxycodone liquid 5 mg PO Q4 prn for pain. Advised him to stay off motrin  3. 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 Insomnia- ambien 5 mg QHS for 10 days  5. Return to clinic in 1 week with CBC and CMP   Visit Diagnosis 1. Squamous cell carcinoma of head and neck ([place])      Dr. Randa Evens, MD, MPH [place] [facility] Pager-  08/19/2016 8:28 AM

## 2016-08-20 ENCOUNTER — Telehealth: Payer: Self-pay | Admitting: *Deleted

## 2016-08-20 ENCOUNTER — Ambulatory Visit
Admission: RE | Admit: 2016-08-20 | Discharge: 2016-08-20 | Disposition: A | Discharge status: Home / Self Care | Payer: Self-pay | Source: Ambulatory Visit | Attending: Radiation Oncology | Admitting: Radiation Oncology

## 2016-08-20 MED ORDER — BIOTENE ORALBALANCE DRY MOUTH MT GEL: 1.0000 "application " | Freq: Four times a day (QID) | OROMUCOSAL | 1 refills | Status: DC | PRN

## 2016-08-20 MED ORDER — GELCLAIR MT GEL: 1.0000 | Freq: Two times a day (BID) | OROMUCOSAL | 1 refills | Status: DC

## 2016-08-20 NOTE — Telephone Encounter (Signed)
Biotene reordered.

## 2016-08-20 NOTE — Telephone Encounter (Signed)
Opened in error

## 2016-08-20 NOTE — Telephone Encounter (Signed)
Patient called to report that he needed a prescription refill for Gelclair. He also stated that there was confusion at the pharmacy regarding oxycodone order. Writer clarified prescription for ROXICODONE with Phil at [facility].

## 2016-08-23 ENCOUNTER — Emergency Department
Admission: EM | Admit: 2016-08-23 | Discharge: 2016-08-23 | Disposition: A | Discharge status: Home / Self Care | Payer: Self-pay | Attending: Student in an Organized Health Care Education/Training Program | Admitting: Student in an Organized Health Care Education/Training Program

## 2016-08-23 ENCOUNTER — Encounter: Payer: Self-pay | Admitting: Emergency Medicine

## 2016-08-23 DIAGNOSIS — B370 Candidal stomatitis: Secondary | ICD-10-CM | POA: Insufficient documentation

## 2016-08-23 DIAGNOSIS — B3781 Candidal esophagitis: Secondary | ICD-10-CM | POA: Insufficient documentation

## 2016-08-23 DIAGNOSIS — Z87891 Personal history of nicotine dependence: Secondary | ICD-10-CM | POA: Insufficient documentation

## 2016-08-23 DIAGNOSIS — J45909 Unspecified asthma, uncomplicated: Secondary | ICD-10-CM | POA: Insufficient documentation

## 2016-08-23 DIAGNOSIS — Z79899 Other long term (current) drug therapy: Secondary | ICD-10-CM | POA: Insufficient documentation

## 2016-08-23 LAB — CBC WITH DIFFERENTIAL/PLATELET
BASOS ABS: 0 10*3/uL (ref 0–0.1)
BASOS PCT: 0 %
Eosinophils Absolute: 0.1 10*3/uL (ref 0–0.7)
Eosinophils Relative: 2 %
HCT: 37.4 % — ABNORMAL LOW (ref 40.0–52.0)
HEMOGLOBIN: 13.1 g/dL (ref 13.0–18.0)
Lymphocytes Relative: 7 %
Lymphs Abs: 0.3 10*3/uL — ABNORMAL LOW (ref 1.0–3.6)
MCH: 33.8 pg (ref 26.0–34.0)
MCHC: 35 g/dL (ref 32.0–36.0)
MCV: 96.4 fL (ref 80.0–100.0)
Monocytes Absolute: 0.6 10*3/uL (ref 0.2–1.0)
Monocytes Relative: 15 %
Neutro Abs: 3.1 10*3/uL (ref 1.4–6.5)
Neutrophils Relative %: 76 %
Platelets: 157 10*3/uL (ref 150–440)
RBC: 3.88 MIL/uL — ABNORMAL LOW (ref 4.40–5.90)
RDW: 14.9 % — ABNORMAL HIGH (ref 11.5–14.5)
WBC: 4.1 10*3/uL (ref 3.8–10.6)

## 2016-08-23 LAB — BASIC METABOLIC PANEL
Anion gap: 6 (ref 5–15)
BUN: 37 mg/dL — ABNORMAL HIGH (ref 6–20)
CO2: 33 mmol/L — ABNORMAL HIGH (ref 22–32)
Calcium: 9.6 mg/dL (ref 8.9–10.3)
Chloride: 94 mmol/L — ABNORMAL LOW (ref 101–111)
Creatinine, Ser: 1.04 mg/dL (ref 0.61–1.24)
GFR calc non Af Amer: >60 mL/min (ref >60)
Glucose, Bld: 90 mg/dL (ref 65–99)
Potassium: 4.4 mmol/L (ref 3.5–5.1)
Sodium: 133 mmol/L — ABNORMAL LOW (ref 135–145)

## 2016-08-23 MED ORDER — FLUCONAZOLE 100 MG PO TABS
200.0000 mg | ORAL_TABLET | Freq: Once | ORAL | Status: AC
  Administered 2016-08-23: 200 mg via ORAL
  Filled 2016-08-23: qty 2

## 2016-08-23 MED ORDER — NYSTATIN 100000 UNIT/ML MT SUSP
5.0000 mL | Freq: Four times a day (QID) | OROMUCOSAL | Status: DC
  Filled 2016-08-23 (×3): qty 5

## 2016-08-23 MED ORDER — FLUCONAZOLE 150 MG PO TABS: 150.0000 mg | ORAL_TABLET | Freq: Every day | ORAL | 0 refills | Status: AC

## 2016-08-23 NOTE — ED Provider Notes (Signed)
[facility] Emergency Department Provider Note    First MD Initiated Contact with Patient 08/23/16 1452     (approximate)  I have reviewed the triage vital signs and the nursing notes.   HISTORY  Chief Complaint Dental Pain    HPI Philip Cordova is a 50 y.o. male with the oral` cancer undergoing radiation and chemotherapy presents with worsening left-sided oral pain and bleeding that started yesterday. Patient states she's had history of the same that was treated with steroids. Denies any fevers. Otherwise feels very well. States that he wants to get "ahead of this "as he scheduled for radiation tomorrow. Denies any difficulty swallowing. No shortness of breath or chest pain. No nausea or vomiting. He is been compliant with his medications as directed. He does take Valtrex.   Past Medical History:  Diagnosis Date  . Asthma   . 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 Cancer ([place])    Family History  Problem Relation Age of Onset  . Cancer Father     Skin  . Hyperlipidemia Father   . Hypertension Father     Possible  . Diabetes Paternal Uncle    Past Surgical History:  Procedure Laterality Date  . APPENDECTOMY  2008  . DIAGNOSTIC LARYNGOSCOPY  06/17/2016   Procedure: DIAGNOSTIC LARYNGOSCOPY WITH BIOPSIES;  Surgeon: Carloyn Manner, MD;  Location: [facility] ORS;  Service: ENT;;  . Moles removed  2005  . RHINOPLASTY  1990  . TONSILLECTOMY N/A 06/17/2016   Procedure: TONSILLECTOMY;  Surgeon: Carloyn Manner, MD;  Location: [facility] ORS;  Service: ENT;  Laterality: N/A;   Patient Active Problem List   Diagnosis Date Noted  . 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 Cervical lymphadenopathy 04/27/2016  . 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 Microcytic hypochromic anemia 01/08/2014  . Asthma 06/22/2012      Prior to Admission medications   Medication Sig Start Date End Date Taking? Authorizing Provider    Artificial Saliva (BIOTENE ORALBALANCE DRY MOUTH) GEL Use as directed 1 application in the mouth or throat 4 (four) times daily as needed (for mouth pain). 08/20/16   Lloyd Huger, MD  aspirin EC 81 MG tablet Take 81 mg by mouth daily.    Historical Provider, MD  B Complex-Biotin-FA (SUPER B-100) TABS Take 1 tablet by mouth daily.    Historical Provider, MD  calcium carbonate 100 mg/ml SUSP  07/04/09   Historical Provider, MD  dexamethasone (DECADRON) 4 MG tablet Take 1 tablet (4 mg total) by mouth 2 (two) times daily with a meal. 08/06/16   Cammie Sickle, MD  Ergocalciferol (VITAMIN D2 PO)  07/04/09   Historical Provider, MD  GLUCOSAMINE-CHONDROITIN PO Take by mouth.    Historical Provider, MD  Melatonin 10 MG TABS Take 1 tablet by mouth at bedtime.    Historical Provider, MD  MELATONIN PO Take 10 mg by mouth daily.    Historical Provider, MD  Multiple Vitamins-Minerals (MULTIVITAMIN WITH MINERALS) tablet Take by mouth. 11/21/08   Historical Provider, MD  ondansetron (ZOFRAN) 8 MG tablet One pill every 8 hours as needed for nausea/ vomitting. 07/02/16   Cammie Sickle, MD  oxyCODONE (ROXICODONE) 5 MG/5ML solution Take 5 mLs (5 mg total) by mouth every 4 (four) hours as needed for severe pain. 08/19/16   Lloyd Huger, MD  OxyCODONE HCl 10 MG/0.5ML CONC Take 0.25 mLs by mouth every 4 (four) hours as needed. 08/19/16  Sindy Guadeloupe, MD  prochlorperazine (COMPAZINE) 10 MG tablet Take 1 tablet (10 mg total) by mouth every 6 (six) hours as needed for nausea or vomiting. 07/02/16   Cammie Sickle, MD  sucralfate (CARAFATE) 1 g tablet Take 1 tablet (1 g total) by mouth 3 (three) times daily. Dissolve in 2-3 tbsp warm water, swish and swallow 07/20/16   Noreene Filbert, MD  valACYclovir (VALTREX) 1000 MG tablet Take 1 tablet (1,000 mg total) by mouth 2 (two) times daily. 08/19/16   Sindy Guadeloupe, MD  zolpidem (AMBIEN) 5 MG tablet Take 1 tablet (5 mg total) by mouth at bedtime as  needed for sleep. 08/19/16   Sindy Guadeloupe, MD    Allergies Milk-related compounds    Social History Social History  Substance Use Topics  . Smoking status: Former Smoker    Years: 1.00    Types: Cigarettes  . Smokeless tobacco: Never Used     Comment: "YEARS AGO"  . Alcohol use No     Comment: Occasionally    Review of Systems Patient denies headaches, rhinorrhea, blurry vision, numbness, shortness of breath, chest pain, edema, cough, abdominal pain, nausea, vomiting, diarrhea, dysuria, fevers, rashes or hallucinations unless otherwise stated above in HPI. ____________________________________________   PHYSICAL EXAM:  VITAL SIGNS: Vitals:   08/23/16 1411  BP: 117/70  Pulse: 80  Temp: 97.6 F (36.4 C)    Constitutional: Alert and oriented. Well appearing and in no acute distress. Eyes: Conjunctivae are normal. PERRL. EOMI. Head: Atraumatic. Nose: No congestion/rhinnorhea. Mouth/Throat: no uvular edema.  Erythema and white coating to left posterior paharynx and tonsil. Neck: No stridor. Painless ROM. No cervical spine tenderness to palpation Hematological/Lymphatic/Immunilogical: No cervical lymphadenopathy. Cardiovascular: Normal rate, regular rhythm. Grossly normal heart sounds.  Good peripheral circulation. Respiratory: Normal respiratory effort.  No retractions. Lungs CTAB. Gastrointestinal: Soft and nontender. No distention. No abdominal bruits. No CVA tenderness. Musculoskeletal: No lower extremity tenderness nor edema.  No joint effusions. Neurologic:  Normal speech and language. No gross focal neurologic deficits are appreciated. No gait instability. Skin:  Skin is warm, dry and intact. No rash noted. Psychiatric: Mood and affect are normal. Speech and behavior are normal.  ____________________________________________   LABS (all labs ordered are listed, but only abnormal results are displayed)  Results for orders placed or performed during the hospital  encounter of 08/23/16 (from the past 24 hour(s))  CBC with Differential/Platelet     Status: Abnormal (Preliminary result)   Collection Time: 08/23/16  3:19 PM  Result Value Ref Range   WBC 4.1 3.8 - 10.6 K/uL   RBC 3.88 (L) 4.40 - 5.90 MIL/uL   Hemoglobin 13.1 13.0 - 18.0 g/dL   HCT 37.4 (L) 40.0 - 52.0 %   MCV 96.4 80.0 - 100.0 fL   MCH 33.8 26.0 - 34.0 pg   MCHC 35.0 32.0 - 36.0 g/dL   RDW 14.9 (H) 11.5 - 14.5 %   Platelets 157 150 - 440 K/uL   Neutrophils Relative % PENDING %   Neutro Abs PENDING 1.7 - 7.7 K/uL   Band Neutrophils PENDING %   Lymphocytes Relative PENDING %   Lymphs Abs PENDING 0.7 - 4.0 K/uL   Monocytes Relative PENDING %   Monocytes Absolute PENDING 0.1 - 1.0 K/uL   Eosinophils Relative PENDING %   Eosinophils Absolute PENDING 0.0 - 0.7 K/uL   Basophils Relative PENDING %   Basophils Absolute PENDING 0.0 - 0.1 K/uL   WBC Morphology PENDING  RBC Morphology PENDING    Smear Review PENDING    Other PENDING %   nRBC PENDING 0 /100 WBC   Metamyelocytes Relative PENDING %   Myelocytes PENDING %   Promyelocytes Absolute PENDING %   Blasts PENDING %  Basic metabolic panel     Status: Abnormal   Collection Time: 08/23/16  3:19 PM  Result Value Ref Range   Sodium 133 (L) 135 - 145 mmol/L   Potassium 4.4 3.5 - 5.1 mmol/L   Chloride 94 (L) 101 - 111 mmol/L   CO2 33 (H) 22 - 32 mmol/L   Glucose, Bld 90 65 - 99 mg/dL   BUN 37 (H) 6 - 20 mg/dL   Creatinine, Ser 1.04 0.61 - 1.24 mg/dL   Calcium 9.6 8.9 - 10.3 mg/dL   GFR calc non Af Amer >60 >60 mL/min   GFR calc Af Amer >60 >60 mL/min   Anion gap 6 5 - 15   ____________________________________________  ____________________________________________  RADIOLOGY   ____________________________________________   PROCEDURES  Procedure(s) performed:  Procedures    Critical Care performed: no ____________________________________________   INITIAL IMPRESSION / ASSESSMENT AND PLAN / ED  COURSE  Pertinent labs & imaging results that were available during my care of the patient were reviewed by me and considered in my medical decision making (see chart for details).  DDX: mucositis, thrush, esophagitis, canker sore,   Philip Cordova is a 50 y.o. who presents to the ED with Throat irritation assessment is complicated located treatment with S [place] carcinoma on chemotherapy and radiotherapy. Patient is afebrile hemodynamic stable. Does not have any evidence of impending airway compromise. No fevers. Exam is concerning for thrush. No significant edema. Patient is stating that he's been treated with Decadron in the past however this is a therapy that are not familiar with for thrush, therefore we will touch base with heme/onc.  The patient will be placed on continuous pulse oximetry and telemetry for monitoring.  Laboratory evaluation will be sent to evaluate for the above complaints.     Clinical Course as of Aug 23 1602  Mon Aug 23, 2016  1536 I spoke with Dr. Grayland Ormond of Heme/onc who recommends proceeding with fluconazole for initial therapy.  No steroids 2/2 concern for worsening thrush.    [PR]  1550 Blood work is reassuring.  Patient tolerating oral hydration. Will DC with oral flucazole and strict return precautions.  Patient has outpatient appointment tomorrow AM.  Have discussed with the patient and available family all diagnostics and treatments performed thus far and all questions were answered to the best of my ability. The patient demonstrates understanding and agreement with plan.   [PR]    Clinical Course User Index [PR] Merlyn Lot, MD     ____________________________________________   FINAL CLINICAL IMPRESSION(S) / ED DIAGNOSES  Final diagnoses:  Thrush of mouth and esophagus ([place])      NEW MEDICATIONS STARTED DURING THIS VISIT:  New Prescriptions   No medications on file     Note:  This document was prepared using Dragon voice recognition  software and may include unintentional dictation errors.    Merlyn Lot, MD 08/23/16 [phone]

## 2016-08-23 NOTE — ED Triage Notes (Signed)
Pt presents with mouth pain. He has oral cancer and has just finished chemo treatment. He states that he normally has some issues after chemo, but that he thinks he is getting an infection on his hard palate. Pt alert & oriented with NAD noted.

## 2016-08-24 ENCOUNTER — Ambulatory Visit: Admission: RE | Admit: 2016-08-24 | Payer: Self-pay | Source: Ambulatory Visit

## 2016-08-24 ENCOUNTER — Other Ambulatory Visit: Payer: Self-pay | Admitting: *Deleted

## 2016-08-24 MED ORDER — FLUCONAZOLE 100 MG PO TABS: 150.0000 mg | ORAL_TABLET | Freq: Every day | ORAL | 0 refills | Status: DC

## 2016-08-25 ENCOUNTER — Inpatient Hospital Stay (HOSPITAL_BASED_OUTPATIENT_CLINIC_OR_DEPARTMENT_OTHER): Payer: Self-pay | Admitting: Hematology and Oncology

## 2016-08-25 ENCOUNTER — Telehealth: Payer: Self-pay | Admitting: *Deleted

## 2016-08-25 ENCOUNTER — Ambulatory Visit: Payer: Self-pay

## 2016-08-25 ENCOUNTER — Other Ambulatory Visit: Payer: Self-pay | Admitting: *Deleted

## 2016-08-25 DIAGNOSIS — R432 Parageusia: Secondary | ICD-10-CM | POA: Insufficient documentation

## 2016-08-25 DIAGNOSIS — R634 Abnormal weight loss: Secondary | ICD-10-CM

## 2016-08-25 DIAGNOSIS — E871 Hypo-osmolality and hyponatremia: Secondary | ICD-10-CM | POA: Insufficient documentation

## 2016-08-25 DIAGNOSIS — K1230 Oral mucositis (ulcerative), unspecified: Secondary | ICD-10-CM

## 2016-08-25 DIAGNOSIS — C760 Malignant neoplasm of head, face and neck: Secondary | ICD-10-CM

## 2016-08-25 DIAGNOSIS — A630 Anogenital (venereal) warts: Secondary | ICD-10-CM

## 2016-08-25 DIAGNOSIS — K1379 Other lesions of oral mucosa: Secondary | ICD-10-CM | POA: Insufficient documentation

## 2016-08-25 DIAGNOSIS — E860 Dehydration: Secondary | ICD-10-CM

## 2016-08-25 DIAGNOSIS — G4700 Insomnia, unspecified: Secondary | ICD-10-CM | POA: Insufficient documentation

## 2016-08-25 DIAGNOSIS — C099 Malignant neoplasm of tonsil, unspecified: Secondary | ICD-10-CM

## 2016-08-25 DIAGNOSIS — Z79899 Other long term (current) drug therapy: Secondary | ICD-10-CM

## 2016-08-25 DIAGNOSIS — Z87891 Personal history of nicotine dependence: Secondary | ICD-10-CM

## 2016-08-25 DIAGNOSIS — K1231 Oral mucositis (ulcerative) due to antineoplastic therapy: Secondary | ICD-10-CM | POA: Insufficient documentation

## 2016-08-25 DIAGNOSIS — G4701 Insomnia due to medical condition: Secondary | ICD-10-CM | POA: Insufficient documentation

## 2016-08-25 MED ORDER — ZOLPIDEM TARTRATE 10 MG PO TABS: 10.0000 mg | ORAL_TABLET | Freq: Every evening | ORAL | 0 refills | Status: DC | PRN

## 2016-08-25 MED ORDER — OXYCODONE HCL 15 MG PO TABS: 15.0000 mg | ORAL_TABLET | ORAL | 0 refills | Status: DC | PRN

## 2016-08-25 NOTE — Progress Notes (Signed)
Patient acute add on today for thrush in his mouth.  Unable to swallow.  Went to ED on Christmas Day.

## 2016-08-25 NOTE — Assessment & Plan Note (Signed)
This is a common side-effect from radiation He has lost some weight We discussed dietary supplementation

## 2016-08-25 NOTE — Assessment & Plan Note (Signed)
Due to mucositis and forceful cough He is reassured  Bleeding has stopped I recommend avoid NSAID

## 2016-08-25 NOTE — Assessment & Plan Note (Signed)
He is willing to try higher dose of Ambien

## 2016-08-25 NOTE — Telephone Encounter (Signed)
Mouth is bleeding and he is running out of meds. Has an appt tomorrow with Dr Janese Banks, but asking to be seen today. Dr Alvy Bimler has agreed to see patient today at 11:45. Patient in agreement with this appt and was able to tell me he knows where Philip Cordova is located as he has been there before

## 2016-08-25 NOTE — Progress Notes (Signed)
[place] progress notes  Patient Care Team: No Pcp Per Patient as PCP - General (General Practice)  CHIEF COMPLAINTS/PURPOSE OF VISIT:  Urgent evaluation due to recent hemoptysis, oral pain related to treatment  HISTORY OF PRESENTING ILLNESS:  Philip Cordova 50 y.o. male was transferred to my care as his oncologist is not available today I reviewed the patient's records extensive and collaborated the history with the patient. Summary of his history is as follows: Oncology History   # AUG 2017-STAGE III [T1N1]SQUAMOUS CELL CA [s/p FNA; Dr.Vaught]  LEFT NECK/Submandibular LN- necrotic [~2-2.5cm]; Right neck [53mm]; SEP 22nd PET- left neck uptake; ? 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 He is still undergoing radiation therapy. On 08/23/16, he presented to the ER due to recurrent hemoptysis. Work-up was unremarkable except for mildly abnormal serum chemistries. He was diagnosed with possible thrush and was prescribed fluconazole. He requested urgent evaluation due to poorly controlled pain He rated his pain at 9/10; he was prescribed oxycodone last week without much benefit. He had recent nausea but no vomiting. Denies recent constipation. He estimated he had lost 10 pounds. He complained of fatigue He had recurrent hemoptysis when he clears his throat in the morning He sleeps 4-5 hours per day. He has altered taste sensation and mild bilateral hearing loss from recent treatment.  MEDICAL HISTORY:  Past Medical History:  Diagnosis Date  . Asthma   . 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 Cancer ([place])     SURGICAL HISTORY: Past Surgical  History:  Procedure Laterality Date  . APPENDECTOMY  2008  . DIAGNOSTIC LARYNGOSCOPY  06/17/2016   Procedure: DIAGNOSTIC LARYNGOSCOPY WITH BIOPSIES;  Surgeon: Carloyn Manner, MD;  Location: [facility] ORS;  Service: ENT;;  . Moles removed  2005  . RHINOPLASTY  1990  . TONSILLECTOMY N/A 06/17/2016   Procedure: TONSILLECTOMY;  Surgeon: Carloyn Manner, MD;  Location: [facility] ORS;  Service: ENT;  Laterality: N/A;    SOCIAL HISTORY: Social History   Social History  . Marital status: Single    Spouse name: N/A  . Number of children: N/A  . Years of education: N/A   Occupational History  . Not on file.   Social History Main Topics  . Smoking status: Former Smoker    Years: 1.00    Types: Cigarettes  . Smokeless tobacco: Never Used     Comment: "YEARS AGO"  . Alcohol use No     Comment: Occasionally  . Drug use: No  . Sexual activity: Not on file   Other Topics Concern  . Not on file   Social History Narrative  . No narrative on file    FAMILY HISTORY: Family History  Problem Relation Age of Onset  . Cancer Father     Skin  . Hyperlipidemia Father   . Hypertension Father     Possible  . Diabetes Paternal Uncle     ALLERGIES:  is allergic to milk-related compounds.  MEDICATIONS:  Current Outpatient Prescriptions  Medication Sig Dispense Refill  . Artificial Saliva (BIOTENE ORALBALANCE DRY MOUTH) GEL Use as directed 1 application in the mouth  or throat 4 (four) times daily as needed (for mouth pain). 2 Tube 1  . B Complex-Biotin-FA (SUPER B-100) TABS Take 1 tablet by mouth daily.    . calcium carbonate 100 mg/ml SUSP     . Ergocalciferol (VITAMIN D2 PO)     . fluconazole (DIFLUCAN) 100 MG tablet Take 1.5 tablets (150 mg total) by mouth daily. 5 tablet 0  . fluconazole (DIFLUCAN) 150 MG tablet Take 1 tablet (150 mg total) by mouth daily. 10 tablet 0  . GLUCOSAMINE-CHONDROITIN PO Take by mouth.    . Melatonin 10 MG TABS Take 1 tablet by mouth at bedtime.    . Multiple  Vitamins-Minerals (MULTIVITAMIN WITH MINERALS) tablet Take by mouth.    . oxyCODONE (ROXICODONE) 5 MG/5ML solution Take 5 mLs (5 mg total) by mouth every 4 (four) hours as needed for severe pain. 120 mL 0  . OxyCODONE HCl 10 MG/0.5ML CONC Take 0.25 mLs by mouth every 4 (four) hours as needed. 15 mL 0  . prochlorperazine (COMPAZINE) 10 MG tablet Take 1 tablet (10 mg total) by mouth every 6 (six) hours as needed for nausea or vomiting. 40 tablet 1  . sucralfate (CARAFATE) 1 g tablet Take 1 tablet (1 g total) by mouth 3 (three) times daily. Dissolve in 2-3 tbsp warm water, swish and swallow 90 tablet 3  . valACYclovir (VALTREX) 1000 MG tablet Take 1 tablet (1,000 mg total) by mouth 2 (two) times daily. 20 tablet 0  . zolpidem (AMBIEN) 5 MG tablet Take 1 tablet (5 mg total) by mouth at bedtime as needed for sleep. 10 tablet 0  . aspirin EC 81 MG tablet Take 81 mg by mouth daily.    Marland Kitchen dexamethasone (DECADRON) 4 MG tablet Take 1 tablet (4 mg total) by mouth 2 (two) times daily with a meal. (Patient not taking: Reported on 08/25/2016) 14 tablet 0  . MELATONIN PO Take 10 mg by mouth daily.    . ondansetron (ZOFRAN) 8 MG tablet One pill every 8 hours as needed for nausea/ vomitting. (Patient not taking: Reported on 08/25/2016) 40 tablet 1  . oxyCODONE (ROXICODONE) 15 MG immediate release tablet Take 1 tablet (15 mg total) by mouth every 4 (four) hours as needed for pain. 60 tablet 0  . zolpidem (AMBIEN) 10 MG tablet Take 1 tablet (10 mg total) by mouth at bedtime as needed for sleep. 30 tablet 0   No current facility-administered medications for this visit.     REVIEW OF SYSTEMS:   Constitutional: Denies fevers, chills or abnormal night sweats Eyes: Denies blurriness of vision, double vision or watery eyes Respiratory: Denies cough, dyspnea or wheezes Cardiovascular: Denies palpitation, chest discomfort or lower extremity swelling Gastrointestinal:  Denies nausea, heartburn or change in bowel  habits Skin: Denies abnormal skin rashes Lymphatics: Denies new lymphadenopathy or easy bruising Neurological:Denies numbness, tingling or new weaknesses Behavioral/Psych: Mood is stable, no new changes  All other systems were reviewed with the patient and are negative.  PHYSICAL EXAMINATION: ECOG PERFORMANCE STATUS: 1 - Symptomatic but completely ambulatory  Vitals:   08/25/16 1201  BP: 111/66  Pulse: 86  Resp: 18  Temp: 97 F (36.1 C)   Filed Weights   08/25/16 1201  Weight: 176 lb (79.8 kg)    GENERAL:alert, no distress and comfortable SKIN: Noted mild erythema of skin around his neck EYES: normal, conjunctiva are pink and non-injected, sclera clear OROPHARYNX:noted mucositis with ulceration on the hard palate without signs of active bleeding. No thrush.  NECK: supple, thyroid normal size, non-tender, without nodularity LYMPH:  no palpable lymphadenopathy in the cervical, axillary or inguinal LUNGS: clear to auscultation and percussion with normal breathing effort HEART: regular rate & rhythm and no murmurs without lower extremity edema ABDOMEN:abdomen soft, non-tender and normal bowel sounds Musculoskeletal:no cyanosis of digits and no clubbing  PSYCH: alert & oriented x 3 with fluent speech NEURO: no focal motor/sensory deficits  LABORATORY DATA:  I have reviewed the data as listed Lab Results  Component Value Date   WBC 4.1 08/23/2016   HGB 13.1 08/23/2016   HCT 37.4 (L) 08/23/2016   MCV 96.4 08/23/2016   PLT 157 08/23/2016    Recent Labs  07/19/16 0824 07/29/16 0858  08/12/16 1354 08/19/16 1000 08/23/16 1519  NA 138 139  < > 132* 129* 133*  K 3.9 3.9  < > 4.0 4.2 4.4  CL 99* 100*  < > 97* 95* 94*  CO2 30 32  < > 28 26 33*  GLUCOSE 108* 103*  < > 146* 119* 90  BUN 15 20  < > 25* 34* 37*  CREATININE 0.94 0.85  < > 0.85 0.85 1.04  CALCIUM 9.1 9.4  < > 9.3 9.3 9.6  GFRNONAA >60 >60  < > >60 >60 >60  GFRAA >60 >60  < > >60 >60 >60  PROT 7.0 7.0  --    --  7.5  --   ALBUMIN 4.0 3.9  --   --  4.1  --   AST 29 24  --   --  25  --   ALT 34 27  --   --  30  --   ALKPHOS 52 55  --   --  56  --   BILITOT 0.4 0.6  --   --  0.6  --   < > = values in this interval not displayed.  ASSESSMENT & PLAN:  Squamous cell carcinoma of head and neck ([facility]) He tolerated treatment well with expected side-effects I encourage the patient and provided supportive care I will reschedule his appointment to see his oncologist to next week  Mucositis due to antineoplastic therapy This is an expected side-effects from treatment I recommend increasing the dose of oxycodone to 15 mg as needed along with OTC acetaminophen as needed I recommend avoiding NSAID due to risk of bleeding  Oral bleeding Due to mucositis and forceful cough He is reassured  Bleeding has stopped I recommend avoid NSAID  Dehydration with hyponatremia The patient admits he has mild dehydration likely due to pain, with mild elevated baseline creatinine and hyponatremia He is able to hydrate adequately by mouth  He does not have a feeding tube  Dysgeusia This is a common side-effect from radiation He has lost some weight We discussed dietary supplementation  Insomnia due to medical condition He is willing to try higher dose of Ambien   No orders of the defined types were placed in this encounter.   All questions were answered. The patient knows to call the clinic with any problems, questions or concerns. I spent 30 minutes counseling the patient face to face. The total time spent in the appointment was 40 minutes and more than 50% was on counseling.     Heath Lark, MD 08/25/2016 12:40 PM

## 2016-08-25 NOTE — Assessment & Plan Note (Signed)
This is an expected side-effects from treatment I recommend increasing the dose of oxycodone to 15 mg as needed along with OTC acetaminophen as needed I recommend avoiding NSAID due to risk of bleeding

## 2016-08-25 NOTE — Assessment & Plan Note (Signed)
He tolerated treatment well with expected side-effects I encourage the patient and provided supportive care I will reschedule his appointment to see his oncologist to next week

## 2016-08-25 NOTE — Assessment & Plan Note (Signed)
The patient admits he has mild dehydration likely due to pain, with mild elevated baseline creatinine and hyponatremia He is able to hydrate adequately by mouth  He does not have a feeding tube

## 2016-08-26 ENCOUNTER — Ambulatory Visit: Payer: Self-pay

## 2016-08-26 ENCOUNTER — Inpatient Hospital Stay: Payer: Self-pay

## 2016-08-26 ENCOUNTER — Inpatient Hospital Stay: Payer: Self-pay | Admitting: Oncology

## 2016-08-27 ENCOUNTER — Ambulatory Visit: Admission: RE | Admit: 2016-08-27 | Payer: Self-pay | Source: Ambulatory Visit

## 2016-08-31 ENCOUNTER — Inpatient Hospital Stay: Payer: Self-pay | Attending: Oncology | Admitting: Oncology

## 2016-08-31 ENCOUNTER — Encounter: Payer: Self-pay | Admitting: Oncology

## 2016-08-31 ENCOUNTER — Inpatient Hospital Stay: Payer: Self-pay

## 2016-08-31 ENCOUNTER — Ambulatory Visit
Admission: RE | Admit: 2016-08-31 | Discharge: 2016-08-31 | Disposition: A | Discharge status: Home / Self Care | Payer: Self-pay | Source: Ambulatory Visit | Attending: Radiation Oncology | Admitting: Radiation Oncology

## 2016-08-31 VITALS — BP 109/74 | HR 66 | Temp 97.5°F | Resp 18 | Ht 73.0 in | Wt 178.6 lb

## 2016-08-31 DIAGNOSIS — Z7982 Long term (current) use of aspirin: Secondary | ICD-10-CM | POA: Insufficient documentation

## 2016-08-31 DIAGNOSIS — A630 Anogenital (venereal) warts: Secondary | ICD-10-CM | POA: Insufficient documentation

## 2016-08-31 DIAGNOSIS — Z9221 Personal history of antineoplastic chemotherapy: Secondary | ICD-10-CM | POA: Insufficient documentation

## 2016-08-31 DIAGNOSIS — K1230 Oral mucositis (ulcerative), unspecified: Secondary | ICD-10-CM | POA: Insufficient documentation

## 2016-08-31 DIAGNOSIS — Z79899 Other long term (current) drug therapy: Secondary | ICD-10-CM | POA: Insufficient documentation

## 2016-08-31 DIAGNOSIS — C760 Malignant neoplasm of head, face and neck: Secondary | ICD-10-CM

## 2016-08-31 DIAGNOSIS — Z87891 Personal history of nicotine dependence: Secondary | ICD-10-CM | POA: Insufficient documentation

## 2016-08-31 DIAGNOSIS — G4700 Insomnia, unspecified: Secondary | ICD-10-CM | POA: Insufficient documentation

## 2016-08-31 DIAGNOSIS — C099 Malignant neoplasm of tonsil, unspecified: Secondary | ICD-10-CM | POA: Insufficient documentation

## 2016-08-31 DIAGNOSIS — G4701 Insomnia due to medical condition: Secondary | ICD-10-CM

## 2016-08-31 DIAGNOSIS — J45909 Unspecified asthma, uncomplicated: Secondary | ICD-10-CM | POA: Insufficient documentation

## 2016-08-31 DIAGNOSIS — K5903 Drug induced constipation: Secondary | ICD-10-CM

## 2016-08-31 DIAGNOSIS — Z923 Personal history of irradiation: Secondary | ICD-10-CM | POA: Insufficient documentation

## 2016-08-31 DIAGNOSIS — K5909 Other constipation: Secondary | ICD-10-CM | POA: Insufficient documentation

## 2016-08-31 LAB — CBC
HCT: 32.8 % — ABNORMAL LOW (ref 40.0–52.0)
HEMOGLOBIN: 11.4 g/dL — AB (ref 13.0–18.0)
MCH: 33.3 pg (ref 26.0–34.0)
MCHC: 34.9 g/dL (ref 32.0–36.0)
MCV: 95.4 fL (ref 80.0–100.0)
Platelets: 126 10*3/uL — ABNORMAL LOW (ref 150–440)
RBC: 3.43 MIL/uL — ABNORMAL LOW (ref 4.40–5.90)
RDW: 14.6 % — AB (ref 11.5–14.5)
WBC: 6.1 10*3/uL (ref 3.8–10.6)

## 2016-08-31 MED ORDER — SENNA 8.6 MG PO TABS: 2.0000 | ORAL_TABLET | Freq: Every day | ORAL | 0 refills | Status: DC

## 2016-08-31 NOTE — Progress Notes (Signed)
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  Chief complaint/ Reason for visit- routine follow-up of head and neck cancer and to assess symptoms of mucositis and pain.  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 He had a right neck node that was 7 mm in size. PET CT scan on September 22 showed uptake in the left neck and possible uptake in the left tonsil. Left tonsillar biopsy was positive for squamous cell carcinoma. He is currently receiving definitive chemoradiation and has completed 3 doses of every 3 weeks cisplatin 100 mg/m.  Chemotherapy 07/08/2016 07/29/2016 08/19/2016  Day, Cycle Day 1, Cycle 1 Day 1, Cycle 2 Day 1, Cycle 3  CISplatin (PLATINOL) IV 100 mg/m2 100 mg/m2 100 mg/m2     Interval history- Patient was seen by Dr. Ernst Spell on 08/25/2016 with some symptoms of oral bleeding and worsening mucositis. His oral bleeding was active unit to his mucositis which has since resolved. Also his mucositis is much improved and his pain is well controlled with oxycodone 7.5 mg 3 times a day. Patient has been able to expand his diet and now is able to eat hamburgers and past without any significant pain. He does have some problems with constipation and MiraLAX once a day is not working  ECOG PS- 1 Pain scale- 3 Opioid associated constipation- yes  Review of systems- Review of Systems  Constitutional: Negative for chills, fever, malaise/fatigue and weight loss.  HENT: Negative for congestion, ear discharge and  nosebleeds.   Eyes: Negative for blurred vision.  Respiratory: Negative for cough, hemoptysis, sputum production, shortness of breath and wheezing.   Cardiovascular: Negative for chest pain, palpitations, orthopnea and claudication.  Gastrointestinal: Negative for abdominal pain, blood in stool, constipation, diarrhea, heartburn, melena, nausea and vomiting.  Genitourinary: Negative for dysuria, flank pain, frequency, hematuria and urgency.  Musculoskeletal: Negative for back pain, joint pain and myalgias.  Skin: Negative for rash.  Neurological: Negative for dizziness, tingling, focal weakness, seizures, weakness and headaches.  Endo/Heme/Allergies: Does not bruise/bleed easily.  Psychiatric/Behavioral: Negative for depression and suicidal ideas. The patient does not have insomnia.      Current treatment- ongoing radiation  Allergies  Allergen Reactions  . Milk-Related Compounds      Past Medical History:  Diagnosis Date  . Asthma   . 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 Cancer ([place])      Past Surgical History:  Procedure Laterality Date  . APPENDECTOMY  2008  . DIAGNOSTIC LARYNGOSCOPY  06/17/2016   Procedure: DIAGNOSTIC LARYNGOSCOPY WITH BIOPSIES;  Surgeon: Carloyn Manner, MD;  Location: [facility] ORS;  Service: ENT;;  . Moles removed  2005  . RHINOPLASTY  1990  . TONSILLECTOMY N/A 06/17/2016   Procedure: TONSILLECTOMY;  Surgeon: Carloyn Manner, MD;  Location: [facility] ORS;  Service: ENT;  Laterality: N/A;    Social History   Social History  . Marital status: Single    Spouse name: N/A  . Number of children: N/A  . Years of education: N/A   Occupational History  .  Not on file.   Social History Main Topics  . Smoking status: Former Smoker    Years: 1.00    Types: Cigarettes  . Smokeless tobacco: Never Used     Comment: "YEARS AGO"  . Alcohol use No     Comment: Occasionally  . Drug use: No  . Sexual activity: Not on file   Other Topics Concern  .  Not on file   Social History Narrative  . No narrative on file    Family History  Problem Relation Age of Onset  . Cancer Father     Skin  . Hyperlipidemia Father   . Hypertension Father     Possible  . Diabetes Paternal Uncle      Current Outpatient Prescriptions:  .  Artificial Saliva (BIOTENE ORALBALANCE DRY MOUTH) GEL, Use as directed 1 application in the mouth or throat 4 (four) times daily as needed (for mouth pain)., Disp: 2 Tube, Rfl: 1 .  aspirin EC 81 MG tablet, Take 81 mg by mouth daily., Disp: , Rfl:  .  B Complex-Biotin-FA (SUPER B-100) TABS, Take 1 tablet by mouth daily., Disp: , Rfl:  .  calcium carbonate 100 mg/ml SUSP, , Disp: , Rfl:  .  dexamethasone (DECADRON) 4 MG tablet, Take 1 tablet (4 mg total) by mouth 2 (two) times daily with a meal. (Patient not taking: Reported on 08/25/2016), Disp: 14 tablet, Rfl: 0 .  Ergocalciferol (VITAMIN D2 PO), , Disp: , Rfl:  .  fluconazole (DIFLUCAN) 100 MG tablet, Take 1.5 tablets (150 mg total) by mouth daily., Disp: 5 tablet, Rfl: 0 .  fluconazole (DIFLUCAN) 150 MG tablet, Take 1 tablet (150 mg total) by mouth daily., Disp: 10 tablet, Rfl: 0 .  GLUCOSAMINE-CHONDROITIN PO, Take by mouth., Disp: , Rfl:  .  Melatonin 10 MG TABS, Take 1 tablet by mouth at bedtime., Disp: , Rfl:  .  MELATONIN PO, Take 10 mg by mouth daily., Disp: , Rfl:  .  Multiple Vitamins-Minerals (MULTIVITAMIN WITH MINERALS) tablet, Take by mouth., Disp: , Rfl:  .  ondansetron (ZOFRAN) 8 MG tablet, One pill every 8 hours as needed for nausea/ vomitting. (Patient not taking: Reported on 08/25/2016), Disp: 40 tablet, Rfl: 1 .  oxyCODONE (ROXICODONE) 15 MG immediate release tablet, Take 1 tablet (15 mg total) by mouth every 4 (four) hours as needed for pain., Disp: 60 tablet, Rfl: 0 .  oxyCODONE (ROXICODONE) 5 MG/5ML solution, Take 5 mLs (5 mg total) by mouth every 4 (four) hours as needed for severe pain., Disp: 120 mL, Rfl: 0 .  OxyCODONE HCl 10 MG/0.5ML CONC,  Take 0.25 mLs by mouth every 4 (four) hours as needed., Disp: 15 mL, Rfl: 0 .  prochlorperazine (COMPAZINE) 10 MG tablet, Take 1 tablet (10 mg total) by mouth every 6 (six) hours as needed for nausea or vomiting., Disp: 40 tablet, Rfl: 1 .  sucralfate (CARAFATE) 1 g tablet, Take 1 tablet (1 g total) by mouth 3 (three) times daily. Dissolve in 2-3 tbsp warm water, swish and swallow, Disp: 90 tablet, Rfl: 3 .  valACYclovir (VALTREX) 1000 MG tablet, Take 1 tablet (1,000 mg total) by mouth 2 (two) times daily., Disp: 20 tablet, Rfl: 0 .  zolpidem (AMBIEN) 10 MG tablet, Take 1 tablet (10 mg total) by mouth at bedtime as needed for sleep., Disp: 30 tablet, Rfl: 0 .  zolpidem (AMBIEN) 5 MG tablet, Take 1 tablet (5 mg total) by mouth at bedtime as needed for sleep., Disp: 10  tablet, Rfl: 0  Physical exam:  Vitals:   08/31/16 1443  BP: 109/74  Pulse: 66  Resp: 18  Temp: 97.5 F (36.4 C)  TempSrc: Tympanic  Weight: 178 lb 9.2 oz (81 kg)  Height: 6\' 1"  (1.854 m)   Physical Exam  Constitutional: He is oriented to person, place, and time and well-developed, well-nourished, and in no distress.  HENT:  Head: Normocephalic and atraumatic.  Mild erythema noted on the hard palate posteriorly. No other evidence of mucositis  Eyes: EOM are normal. Pupils are equal, round, and reactive to light.  Neck: Normal range of motion.  Cardiovascular: Normal rate, regular rhythm and normal heart sounds.   Pulmonary/Chest: Effort normal and breath sounds normal.  Abdominal: Soft. Bowel sounds are normal.  Neurological: He is alert and oriented to person, place, and time.  Skin: Skin is warm and dry.     CMP Latest Ref Rng & Units 08/23/2016  Glucose 65 - 99 mg/dL 90  BUN 6 - 20 mg/dL 37(H)  Creatinine 0.61 - 1.24 mg/dL 1.04  Sodium 135 - 145 mmol/L 133(L)  Potassium 3.5 - 5.1 mmol/L 4.4  Chloride 101 - 111 mmol/L 94(L)  CO2 22 - 32 mmol/L 33(H)  Calcium 8.9 - 10.3 mg/dL 9.6  Total Protein 6.5 - 8.1 g/dL -   Total Bilirubin 0.3 - 1.2 mg/dL -  Alkaline Phos 38 - 126 U/L -  AST 15 - 41 U/L -  ALT 17 - 63 U/L -   CBC Latest Ref Rng & Units 08/31/2016  WBC 3.8 - 10.6 K/uL 6.1  Hemoglobin 13.0 - 18.0 g/dL 11.4(L)  Hematocrit 40.0 - 52.0 % 32.8(L)  Platelets 150 - 440 K/uL 126(L)      Assessment and plan- Patient is a 51 y.o. male with a history of squamous cell carcinoma of the head and neck status post 3 cycles of cisplatin at 100 mg/m with Concurrent radiation  1. Mucositis from antineoplastic chemotherapy- Much improved continue oxycodone when necessary. 2. Oral bleeding- Likely secondary to mucositis. Resolved 3. Insomnia-continue Ambien 4. Opioid associated constipation- I suggested that he should be taking Senokot in addition to MiraLAX to help him with his bowel movements. He still does not have any bowel movements up to 2 days he can try enema  Return to clinic in 2 weeks' time to see Dr. Rogue Bussing with labs    Visit Diagnosis 1. Drug-induced constipation   2. Squamous cell carcinoma of head and neck (HCC)   3. Insomnia due to medical condition      Dr. Randa Evens, MD, MPH [facility] at [facility] Pager- [phone] 08/31/2016 2:20 PM

## 2016-09-01 ENCOUNTER — Other Ambulatory Visit: Payer: Self-pay | Admitting: *Deleted

## 2016-09-01 ENCOUNTER — Ambulatory Visit
Admission: RE | Admit: 2016-09-01 | Discharge: 2016-09-01 | Disposition: A | Discharge status: Home / Self Care | Payer: Self-pay | Source: Ambulatory Visit | Attending: Radiation Oncology | Admitting: Radiation Oncology

## 2016-09-01 DIAGNOSIS — C760 Malignant neoplasm of head, face and neck: Secondary | ICD-10-CM

## 2016-09-02 ENCOUNTER — Other Ambulatory Visit: Payer: Self-pay | Admitting: *Deleted

## 2016-09-02 ENCOUNTER — Ambulatory Visit
Admission: RE | Admit: 2016-09-02 | Discharge: 2016-09-02 | Disposition: A | Discharge status: Home / Self Care | Payer: Self-pay | Source: Ambulatory Visit | Attending: Radiation Oncology | Admitting: Radiation Oncology

## 2016-09-02 ENCOUNTER — Telehealth: Payer: Self-pay | Admitting: *Deleted

## 2016-09-02 DIAGNOSIS — C760 Malignant neoplasm of head, face and neck: Secondary | ICD-10-CM

## 2016-09-02 MED ORDER — LANSOPRAZOLE 30 MG PO TBDP: 30.0000 mg | ORAL_TABLET | Freq: Every day | ORAL | 0 refills | Status: DC

## 2016-09-02 MED ORDER — DEXAMETHASONE 4 MG PO TABS: 4.0000 mg | ORAL_TABLET | Freq: Two times a day (BID) | ORAL | 0 refills | Status: DC

## 2016-09-02 NOTE — Telephone Encounter (Signed)
Incoming call from [place], [place] in radiation. Pt reports to radiation team that he has not been able to eat or drink in several days. requesting to be added to schedule for IV fluids.  V/o to add pt for IV fluids on 09/03/16 and add pt for metc and mag level.

## 2016-09-03 ENCOUNTER — Other Ambulatory Visit: Payer: Self-pay

## 2016-09-03 ENCOUNTER — Inpatient Hospital Stay: Payer: Self-pay

## 2016-09-03 ENCOUNTER — Other Ambulatory Visit: Payer: Self-pay | Admitting: Internal Medicine

## 2016-09-03 ENCOUNTER — Ambulatory Visit
Admission: RE | Admit: 2016-09-03 | Discharge: 2016-09-03 | Disposition: A | Discharge status: Home / Self Care | Payer: Self-pay | Source: Ambulatory Visit | Attending: Radiation Oncology | Admitting: Radiation Oncology

## 2016-09-03 VITALS — BP 123/84 | HR 84 | Temp 96.3°F | Resp 18

## 2016-09-03 DIAGNOSIS — C760 Malignant neoplasm of head, face and neck: Secondary | ICD-10-CM

## 2016-09-03 DIAGNOSIS — E860 Dehydration: Secondary | ICD-10-CM

## 2016-09-03 LAB — COMPREHENSIVE METABOLIC PANEL
ALBUMIN: 4.1 g/dL (ref 3.5–5.0)
ALK PHOS: 55 U/L (ref 38–126)
ALT: 31 U/L (ref 17–63)
ANION GAP: 11 (ref 5–15)
AST: 30 U/L (ref 15–41)
BILIRUBIN TOTAL: 0.7 mg/dL (ref 0.3–1.2)
BUN: 33 mg/dL — ABNORMAL HIGH (ref 6–20)
CALCIUM: 9.9 mg/dL (ref 8.9–10.3)
CO2: 25 mmol/L (ref 22–32)
Chloride: 100 mmol/L — ABNORMAL LOW (ref 101–111)
Creatinine, Ser: 1.54 mg/dL — ABNORMAL HIGH (ref 0.61–1.24)
GFR calc Af Amer: 60 mL/min — ABNORMAL LOW (ref >60)
GFR, EST NON AFRICAN AMERICAN: 52 mL/min — AB (ref >60)
GLUCOSE: 232 mg/dL — AB (ref 65–99)
Potassium: 4.6 mmol/L (ref 3.5–5.1)
Sodium: 136 mmol/L (ref 135–145)
Total Protein: 7.7 g/dL (ref 6.5–8.1)

## 2016-09-03 LAB — MAGNESIUM: Magnesium: 2 mg/dL (ref 1.7–2.4)

## 2016-09-03 MED ORDER — SODIUM CHLORIDE 0.9 % IV SOLN
Freq: Once | INTRAVENOUS | Status: AC
  Administered 2016-09-03: 11:00:00 via INTRAVENOUS
  Filled 2016-09-03: qty 4

## 2016-09-03 MED ORDER — SODIUM CHLORIDE 0.9 % IV SOLN
Freq: Once | INTRAVENOUS | Status: AC
  Administered 2016-09-03: 11:00:00 via INTRAVENOUS
  Filled 2016-09-03: qty 1000

## 2016-09-03 NOTE — Progress Notes (Unsigned)
PSN received a phone call from Radiation RN, who stated that patient had been heard expressing that he would cause bodily harm to his brother if he "mouthed off" at him again.  PSN stated to RN  that if it was felt that patient would indeed cause harm to the brother, that law enforcement would need to be notified; however, before calling law enforcement, they should make sure that this would not be a HIPAA violation.

## 2016-09-06 ENCOUNTER — Other Ambulatory Visit: Payer: Self-pay | Admitting: *Deleted

## 2016-09-06 ENCOUNTER — Ambulatory Visit: Payer: Self-pay

## 2016-09-06 ENCOUNTER — Inpatient Hospital Stay: Payer: Self-pay

## 2016-09-06 DIAGNOSIS — C760 Malignant neoplasm of head, face and neck: Secondary | ICD-10-CM

## 2016-09-06 NOTE — Progress Notes (Signed)
Nutrition Follow-up:  Patient seen in clinic for follow-up nutrition appointment. Patient with tongue/tonsilar cancer.  Patient complains of sore mouth and bitter taste when eating carbohydrates.  Noted patient received IV fluids on 1/5.   Patient reports that he is eating 1 3/4 eggs in am with 10 pieces of shrimp, 3 mild sausages (reports has 250 kcals), a protein shake (250 calories and 9 gm of protein), for lunch 1/2 can chicken and rice soup with 7-8 shrimp, then for dinner has other 1/2 can chicken and rice soup, 2 more eggs.  Estimated caloric intake [phone] kcals/d.  Reports that he is drinking 4 32 oz containers of water per day.       Medications: decadron, carafate, biotene, b complex, MVI, vit D, miralax, compazine, senokot, oxycodone  Labs: glucose 232, BUN 33, creatinine 1.54  Anthropometrics:   Height: 73 inches Weight: 174 lb in clinic today previous 190 lb on 12/21 unsure if actual weight taken in clinic UBW: 190 lb  8% weight loss in the last 2 weeks.  Noted received IV fluids on 1/5    NUTRITION DIAGNOSIS: Inadequate oral intake related to mucositis and bitter taste as evidenced by decreased intake and weight loss of 8% in 2 weeks.     MALNUTRITION DIAGNOSIS: Patient meets criteria for severe malnutrition in context of acute illness as evidenced by intake < or equal to 50% energy needs for > or equal to 5 days.     INTERVENTION:   Discussed ways to increase calories and protein.  Patient not wanting to add additional carbohydrate to diet as reports has increased bitter taste when eating carbohydrate.   Discussed feeding tube. Patient reports this has been discussed with him in the past and has refused it.  Refusing it currently during visit.  Discussed pros and cons.  Patient to consider.   Patient wanting to go to radiation to see if radiation appointment can be changed today.    MONITORING, EVALUATION, GOAL: Patient will consume adequate calories and protein  to maintain weight.   NEXT VISIT:  Jan 18th at 2pm following radiation  [place]. Zenia Resides, [place], [place] (pager)

## 2016-09-07 ENCOUNTER — Ambulatory Visit: Payer: Self-pay

## 2016-09-08 ENCOUNTER — Inpatient Hospital Stay: Payer: Self-pay

## 2016-09-08 ENCOUNTER — Ambulatory Visit
Admission: RE | Admit: 2016-09-08 | Discharge: 2016-09-08 | Disposition: A | Discharge status: Home / Self Care | Payer: Self-pay | Source: Ambulatory Visit | Attending: Radiation Oncology | Admitting: Radiation Oncology

## 2016-09-09 ENCOUNTER — Ambulatory Visit
Admission: RE | Admit: 2016-09-09 | Discharge: 2016-09-09 | Disposition: A | Discharge status: Home / Self Care | Payer: Self-pay | Source: Ambulatory Visit | Attending: Radiation Oncology | Admitting: Radiation Oncology

## 2016-09-09 ENCOUNTER — Ambulatory Visit: Payer: Self-pay

## 2016-09-10 ENCOUNTER — Ambulatory Visit
Admission: RE | Admit: 2016-09-10 | Discharge: 2016-09-10 | Disposition: A | Discharge status: Home / Self Care | Payer: Self-pay | Source: Ambulatory Visit | Attending: Radiation Oncology | Admitting: Radiation Oncology

## 2016-09-13 ENCOUNTER — Ambulatory Visit
Admission: RE | Admit: 2016-09-13 | Discharge: 2016-09-13 | Disposition: A | Discharge status: Home / Self Care | Payer: Self-pay | Source: Ambulatory Visit | Attending: Radiation Oncology | Admitting: Radiation Oncology

## 2016-09-14 ENCOUNTER — Ambulatory Visit
Admission: RE | Admit: 2016-09-14 | Discharge: 2016-09-14 | Disposition: A | Discharge status: Home / Self Care | Payer: Self-pay | Source: Ambulatory Visit | Attending: Radiation Oncology | Admitting: Radiation Oncology

## 2016-09-14 ENCOUNTER — Other Ambulatory Visit: Payer: Self-pay | Admitting: *Deleted

## 2016-09-14 MED ORDER — CIPROFLOXACIN-DEXAMETHASONE 0.3-0.1 % OT SUSP: 4.0000 [drp] | Freq: Two times a day (BID) | OTIC | 0 refills | Status: DC

## 2016-09-15 ENCOUNTER — Inpatient Hospital Stay: Payer: Self-pay

## 2016-09-15 ENCOUNTER — Ambulatory Visit
Admission: RE | Admit: 2016-09-15 | Discharge: 2016-09-15 | Disposition: A | Discharge status: Home / Self Care | Payer: Self-pay | Source: Ambulatory Visit | Attending: Radiation Oncology | Admitting: Radiation Oncology

## 2016-09-15 ENCOUNTER — Inpatient Hospital Stay (HOSPITAL_BASED_OUTPATIENT_CLINIC_OR_DEPARTMENT_OTHER): Payer: Self-pay | Admitting: Internal Medicine

## 2016-09-15 ENCOUNTER — Ambulatory Visit: Payer: Self-pay

## 2016-09-15 VITALS — BP 123/76 | HR 80 | Temp 97.6°F | Ht 73.0 in | Wt 173.9 lb

## 2016-09-15 DIAGNOSIS — C099 Malignant neoplasm of tonsil, unspecified: Secondary | ICD-10-CM

## 2016-09-15 DIAGNOSIS — C760 Malignant neoplasm of head, face and neck: Secondary | ICD-10-CM

## 2016-09-15 DIAGNOSIS — Z9221 Personal history of antineoplastic chemotherapy: Secondary | ICD-10-CM

## 2016-09-15 DIAGNOSIS — G4700 Insomnia, unspecified: Secondary | ICD-10-CM

## 2016-09-15 DIAGNOSIS — K1230 Oral mucositis (ulcerative), unspecified: Secondary | ICD-10-CM

## 2016-09-15 DIAGNOSIS — Z923 Personal history of irradiation: Secondary | ICD-10-CM

## 2016-09-15 DIAGNOSIS — Z79899 Other long term (current) drug therapy: Secondary | ICD-10-CM

## 2016-09-15 DIAGNOSIS — K5909 Other constipation: Secondary | ICD-10-CM

## 2016-09-15 LAB — CBC WITH DIFFERENTIAL/PLATELET
BASOS PCT: 0 %
Basophils Absolute: 0 10*3/uL (ref 0–0.1)
EOS ABS: 0 10*3/uL (ref 0–0.7)
Eosinophils Relative: 0 %
HCT: 33.7 % — ABNORMAL LOW (ref 40.0–52.0)
HEMOGLOBIN: 11.8 g/dL — AB (ref 13.0–18.0)
Lymphocytes Relative: 3 %
Lymphs Abs: 0.3 10*3/uL — ABNORMAL LOW (ref 1.0–3.6)
MCH: 34.2 pg — ABNORMAL HIGH (ref 26.0–34.0)
MCHC: 35.1 g/dL (ref 32.0–36.0)
MCV: 97.4 fL (ref 80.0–100.0)
Monocytes Absolute: 0.7 10*3/uL (ref 0.2–1.0)
Monocytes Relative: 8 %
Neutro Abs: 7.6 10*3/uL — ABNORMAL HIGH (ref 1.4–6.5)
Neutrophils Relative %: 89 %
Platelets: 262 10*3/uL (ref 150–440)
RBC: 3.46 MIL/uL — ABNORMAL LOW (ref 4.40–5.90)
RDW: 17.2 % — AB (ref 11.5–14.5)
WBC: 8.5 10*3/uL (ref 3.8–10.6)

## 2016-09-15 LAB — COMPREHENSIVE METABOLIC PANEL
ALBUMIN: 3.7 g/dL (ref 3.5–5.0)
ALK PHOS: 42 U/L (ref 38–126)
ALT: 41 U/L (ref 17–63)
AST: 24 U/L (ref 15–41)
Anion gap: 9 (ref 5–15)
BUN: 32 mg/dL — ABNORMAL HIGH (ref 6–20)
CO2: 28 mmol/L (ref 22–32)
Calcium: 9.2 mg/dL (ref 8.9–10.3)
Chloride: 94 mmol/L — ABNORMAL LOW (ref 101–111)
Creatinine, Ser: 0.98 mg/dL (ref 0.61–1.24)
GFR calc Af Amer: >60 mL/min (ref >60)
GFR calc non Af Amer: >60 mL/min (ref >60)
GLUCOSE: 127 mg/dL — AB (ref 65–99)
POTASSIUM: 4.1 mmol/L (ref 3.5–5.1)
SODIUM: 131 mmol/L — AB (ref 135–145)
Total Bilirubin: 0.6 mg/dL (ref 0.3–1.2)
Total Protein: 6.4 g/dL — ABNORMAL LOW (ref 6.5–8.1)

## 2016-09-15 NOTE — Progress Notes (Signed)
[facility] CONSULT NOTE  Patient Care Team: No Pcp Per Patient as PCP - General (General Practice)    Oncology History   # AUG 2017-STAGE III [T1N1]SQUAMOUS CELL CA [s/p FNA; Dr.Vaught]  LEFT NECK/Submandibular LN- necrotic [~2-2.5cm]; Right neck [54mm]; SEP 22nd PET- left neck uptake; ? Tonsillar uptake L>R; OCT 2017- Left Tonsil POSITIVE   # Low testosterone [on testosterone shots]     Squamous cell carcinoma of head and neck (Philip Cordova)   05/18/2016 Initial Diagnosis    Squamous cell carcinoma of head and neck (HCC)       HISTORY OF PRESENTING ILLNESS:  Philip Cordova 51 y.o.  male with newly diagnosed squamous cell carcinoma S/P positive currently on definitive chemoradiation. Patient status post cycle #3 of cisplatin approximately 3 weeks ago; finishes RT- 01/22.  Patient complains of mild-to-moderate soreness in the mouth. Difficulty/alteration of the taste. However is able to eat protein-based food. Denies any tingling or numbness. Denies any difficulty swallowing. Denies any ringing in the ears. Appetite is good.   ROS: A complete 10 point review of system is done which is negative except mentioned above in history of present illness  MEDICAL HISTORY:  Past Medical History:  Diagnosis Date  . Asthma   . 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 Cancer ([place])     SURGICAL HISTORY: Past Surgical History:  Procedure Laterality Date  . APPENDECTOMY  2008  . DIAGNOSTIC LARYNGOSCOPY  06/17/2016   Procedure: DIAGNOSTIC LARYNGOSCOPY WITH BIOPSIES;  Surgeon: Carloyn Manner, MD;  Location: [facility] ORS;  Service: ENT;;  . Moles removed  2005  . RHINOPLASTY  1990  . 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  Lives in [place].   Social History   Social History  . Marital status:  Single    Spouse name: N/A  . Number of children: N/A  . Years of education: N/A   Occupational History  . Not on file.   Social History Main Topics  . Smoking status: Former Smoker    Years: 1.00    Types: Cigarettes  . Smokeless tobacco: Never Used     Comment: "YEARS AGO"  . Alcohol use No     Comment: Occasionally  . Drug use: No  . Sexual activity: Not on file   Other Topics Concern  . Not on file   Social History Narrative  . No narrative on file    FAMILY HISTORY:  Family History  Problem Relation Age of Onset  . Cancer Father     Skin  . Hyperlipidemia Father   . Hypertension Father     Possible  . Diabetes Paternal Uncle     ALLERGIES:  is allergic to milk-related compounds.  MEDICATIONS:  Current Outpatient Prescriptions  Medication Sig Dispense Refill  . Artificial Saliva (BIOTENE ORALBALANCE DRY MOUTH) GEL Use as directed 1 application in the mouth or throat 4 (four) times daily as needed (for mouth pain). 2 Tube 1  . aspirin EC 81 MG tablet Take 81 mg by mouth daily.    . B Complex-Biotin-FA (SUPER B-100) TABS Take 1 tablet by mouth daily.    . bisacodyl (DULCOLAX) 5 MG EC tablet Take 10 mg by mouth daily as needed for moderate constipation.    . calcium carbonate 100 mg/ml SUSP     .  ciprofloxacin-dexamethasone (CIPRODEX) otic suspension Place 4 drops into the right ear 2 (two) times daily. 7.5 mL 0  . dexamethasone (DECADRON) 4 MG tablet Take 1 tablet (4 mg total) by mouth 2 (two) times daily with a meal. 30 tablet 0  . Ergocalciferol (VITAMIN D2 PO)     . GLUCOSAMINE-CHONDROITIN PO Take by mouth.    . lansoprazole (PREVACID SOLUTAB) 30 MG disintegrating tablet Take 1 tablet (30 mg total) by mouth daily. 30 tablet 0  . Melatonin 10 MG TABS Take 1 tablet by mouth at bedtime.    . Multiple Vitamins-Minerals (MULTIVITAMIN WITH MINERALS) tablet Take by mouth.    . ondansetron (ZOFRAN) 8 MG tablet One pill every 8 hours as needed for nausea/ vomitting.  40 tablet 1  . oxyCODONE (ROXICODONE) 15 MG immediate release tablet Take 1 tablet (15 mg total) by mouth every 4 (four) hours as needed for pain. 60 tablet 0  . polyethylene glycol (MIRALAX / GLYCOLAX) packet Take 17 g by mouth 3 (three) times daily as needed.    . senna (SENOKOT) 8.6 MG TABS tablet Take 2 tablets (17.2 mg total) by mouth at bedtime. 60 each 0  . sucralfate (CARAFATE) 1 g tablet Take 1 tablet (1 g total) by mouth 3 (three) times daily. Dissolve in 2-3 tbsp warm water, swish and swallow 90 tablet 3  . valACYclovir (VALTREX) 1000 MG tablet Take 1 tablet (1,000 mg total) by mouth 2 (two) times daily. 20 tablet 0  . zolpidem (AMBIEN) 10 MG tablet Take 1 tablet (10 mg total) by mouth at bedtime as needed for sleep. 30 tablet 0  . fluconazole (DIFLUCAN) 100 MG tablet Take 1.5 tablets (150 mg total) by mouth daily. (Patient not taking: Reported on 09/15/2016) 5 tablet 0  . prochlorperazine (COMPAZINE) 10 MG tablet Take 1 tablet (10 mg total) by mouth every 6 (six) hours as needed for nausea or vomiting. (Patient not taking: Reported on 09/15/2016) 40 tablet 1   No current facility-administered medications for this visit.       Marland Kitchen  PHYSICAL EXAMINATION: ECOG PERFORMANCE STATUS: 0 - Asymptomatic  Vitals:   09/15/16 1258  BP: 123/76  Pulse: 80  Temp: 97.6 F (36.4 C)   Filed Weights   09/15/16 1258  Weight: 173 lb 15.1 oz (78.9 kg)    GENERAL: Well-nourished well-developed; Alert, no distress and comfortable. He is alone. EYES: no pallor or icterus OROPHARYNX: no thrush or ulceration; good dentition  NECK: supple, no masses felt LYMPH:  no palpable lymphadenopathy in the axillary or inguinal regions; appx ~1cm LN felt in left submandibular region [improved] LUNGS: clear to auscultation and  No wheeze or crackles HEART/CVS: regular rate & rhythm and no murmurs; No lower extremity edema ABDOMEN: abdomen soft, non-tender and normal bowel sounds Musculoskeletal:no cyanosis of  digits and no clubbing  PSYCH: alert & oriented x 3 with fluent speech NEURO: no focal motor/sensory deficits SKIN:  no rashes or significant lesions  LABORATORY DATA:  I have reviewed the data as listed Lab Results  Component Value Date   WBC 8.5 09/15/2016   HGB 11.8 (L) 09/15/2016   HCT 33.7 (L) 09/15/2016   MCV 97.4 09/15/2016   PLT 262 09/15/2016    Recent Labs  08/19/16 1000 08/23/16 1519 09/03/16 1035 09/15/16 1238  NA 129* 133* 136 131*  K 4.2 4.4 4.6 4.1  CL 95* 94* 100* 94*  CO2 26 33* 25 28  GLUCOSE 119* 90 232* 127*  BUN 34* 37*  33* 32*  CREATININE 0.85 1.04 1.54* 0.98  CALCIUM 9.3 9.6 9.9 9.2  GFRNONAA >60 >60 52* >60  GFRAA >60 >60 60* >60  PROT 7.5  --  7.7 6.4*  ALBUMIN 4.1  --  4.1 3.7  AST 25  --  30 24  ALT 30  --  31 41  ALKPHOS 56  --  55 42  BILITOT 0.6  --  0.7 0.6    RADIOGRAPHIC STUDIES: I have personally reviewed the radiological images as listed and agreed with the findings in the report. No results found.  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 S/p cycle #3. Tolerating well without any major side effects other than usual expected side effects as discussed below.  Last RT- jan 22nd. Clinical improvement noted.  # Patient has an appointment with ENT in the end of the month. Also discussed that we'll get a PET scan in approximately 8-12 weeks post chemoradiation. We will order a PET scan at next visit  # mucositis- G-2- continue pain medication as needed.  #Testosterone-restart in 6 months.    # follow up in 6 weeks/labs; will order PET scan at that time.    Cammie Sickle, MD 09/15/2016 3:02 PM

## 2016-09-15 NOTE — Progress Notes (Signed)
Patient states he was dx with a double ear infection yesterday.

## 2016-09-15 NOTE — Assessment & Plan Note (Addendum)
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 S/p cycle #3. Tolerating well without any major side effects other than usual expected side effects as discussed below.  Last RT- jan 22nd. Clinical improvement noted.  # Patient has an appointment with ENT in the end of the month. Also discussed that we'll get a PET scan in approximately 8-12 weeks post chemoradiation. We will order a PET scan at next visit  # mucositis- G-2- continue pain medication as needed.  #Testosterone-restart in 6 months.    # follow up in 6 weeks/labs; will order PET scan at that time.

## 2016-09-16 ENCOUNTER — Ambulatory Visit: Payer: Self-pay

## 2016-09-16 ENCOUNTER — Inpatient Hospital Stay: Payer: Self-pay

## 2016-09-17 ENCOUNTER — Other Ambulatory Visit: Payer: Self-pay | Admitting: *Deleted

## 2016-09-17 ENCOUNTER — Ambulatory Visit
Admission: RE | Admit: 2016-09-17 | Discharge: 2016-09-17 | Disposition: A | Discharge status: Home / Self Care | Payer: Self-pay | Source: Ambulatory Visit | Attending: Radiation Oncology | Admitting: Radiation Oncology

## 2016-09-17 ENCOUNTER — Telehealth: Payer: Self-pay | Admitting: *Deleted

## 2016-09-17 ENCOUNTER — Other Ambulatory Visit: Payer: Self-pay | Admitting: Radiation Oncology

## 2016-09-17 MED ORDER — DEXAMETHASONE 4 MG PO TABS: ORAL_TABLET | ORAL | 0 refills | Status: DC

## 2016-09-17 MED ORDER — DEXAMETHASONE 4 MG PO TABS: 4.0000 mg | ORAL_TABLET | Freq: Two times a day (BID) | ORAL | 0 refills | Status: DC

## 2016-09-17 MED ORDER — OXYCODONE HCL 15 MG PO TABS: 15.0000 mg | ORAL_TABLET | ORAL | 0 refills | Status: DC | PRN

## 2016-09-17 MED ORDER — DEXAMETHASONE 4 MG PO TABS
4.0000 mg | ORAL_TABLET | Freq: Two times a day (BID) | ORAL | 0 refills | Status: DC
Start: 2016-09-17 — End: 2016-09-17

## 2016-09-17 NOTE — Telephone Encounter (Signed)
rf

## 2016-09-20 ENCOUNTER — Ambulatory Visit
Admission: RE | Admit: 2016-09-20 | Discharge: 2016-09-20 | Disposition: A | Discharge status: Home / Self Care | Payer: Self-pay | Source: Ambulatory Visit | Attending: Radiation Oncology | Admitting: Radiation Oncology

## 2016-09-20 ENCOUNTER — Ambulatory Visit: Payer: Self-pay

## 2016-09-20 ENCOUNTER — Other Ambulatory Visit: Payer: Self-pay | Admitting: *Deleted

## 2016-09-21 ENCOUNTER — Ambulatory Visit: Payer: Self-pay

## 2016-09-21 ENCOUNTER — Ambulatory Visit
Admission: RE | Admit: 2016-09-21 | Discharge: 2016-09-21 | Disposition: A | Discharge status: Home / Self Care | Payer: Self-pay | Source: Ambulatory Visit | Attending: Radiation Oncology | Admitting: Radiation Oncology

## 2016-09-27 ENCOUNTER — Other Ambulatory Visit: Payer: Self-pay | Admitting: *Deleted

## 2016-09-27 MED ORDER — OXYCODONE HCL 15 MG PO TABS: 15.0000 mg | ORAL_TABLET | ORAL | 0 refills | Status: DC | PRN

## 2016-09-27 NOTE — Telephone Encounter (Signed)
Per VO Dr B, Have patient see me Tuesday in [place]. patient agrees to 900 appt 2/6

## 2016-10-01 ENCOUNTER — Other Ambulatory Visit: Payer: Self-pay | Admitting: Oncology

## 2016-10-04 ENCOUNTER — Telehealth: Payer: Self-pay | Admitting: *Deleted

## 2016-10-04 NOTE — Progress Notes (Signed)
Nutrition Follow-up:  Patient was unable to keep appointment on 1/18 as clinic was closed due to weather conditions.  Called patient home number but no answer. Left message on mobile number. Encouraged patient to call this writer back and reminded patient Dietitian available at this site on Mondays and Thursdays.  Will reach back out to patient as able.  Will plan to follow-up with patient at clinic visit on Feb 26 if not before.  Tiwan Schnitker B. Zenia Resides, [place], [place] (pager)

## 2016-10-04 NOTE — Telephone Encounter (Signed)
Called to report that he  Has lost the use of his left arm/ hand from the elbow down and is asking if it could be the result of his Hydrocodone. Completed chemo 6 weeks ago and XRT 2 weeks ago. Please advise

## 2016-10-04 NOTE — Telephone Encounter (Signed)
Advised per VO Dr Rogue Bussing that if he is concerned and needs to be evaluated before his appt tomorrow morning, he should go to the ER. He acknowledged this and said he plans on seeing md  tomorrow morning in [facility] office as scheduled

## 2016-10-05 ENCOUNTER — Inpatient Hospital Stay: Payer: Self-pay | Attending: Internal Medicine | Admitting: Internal Medicine

## 2016-10-05 ENCOUNTER — Emergency Department: Payer: Self-pay

## 2016-10-05 ENCOUNTER — Encounter: Payer: Self-pay | Admitting: Emergency Medicine

## 2016-10-05 ENCOUNTER — Other Ambulatory Visit: Payer: Self-pay | Admitting: *Deleted

## 2016-10-05 ENCOUNTER — Emergency Department
Admission: EM | Admit: 2016-10-05 | Discharge: 2016-10-05 | Disposition: A | Discharge status: Home / Self Care | Payer: Self-pay | Attending: Emergency Medicine | Admitting: Emergency Medicine

## 2016-10-05 VITALS — BP 111/68 | HR 75 | Temp 96.8°F | Wt 160.1 lb

## 2016-10-05 DIAGNOSIS — G5632 Lesion of radial nerve, left upper limb: Secondary | ICD-10-CM | POA: Insufficient documentation

## 2016-10-05 DIAGNOSIS — C760 Malignant neoplasm of head, face and neck: Secondary | ICD-10-CM

## 2016-10-05 DIAGNOSIS — R531 Weakness: Secondary | ICD-10-CM | POA: Insufficient documentation

## 2016-10-05 DIAGNOSIS — Z7982 Long term (current) use of aspirin: Secondary | ICD-10-CM | POA: Insufficient documentation

## 2016-10-05 DIAGNOSIS — Z79899 Other long term (current) drug therapy: Secondary | ICD-10-CM | POA: Insufficient documentation

## 2016-10-05 DIAGNOSIS — Z923 Personal history of irradiation: Secondary | ICD-10-CM | POA: Insufficient documentation

## 2016-10-05 DIAGNOSIS — J45909 Unspecified asthma, uncomplicated: Secondary | ICD-10-CM | POA: Insufficient documentation

## 2016-10-05 DIAGNOSIS — Z87891 Personal history of nicotine dependence: Secondary | ICD-10-CM | POA: Insufficient documentation

## 2016-10-05 DIAGNOSIS — C099 Malignant neoplasm of tonsil, unspecified: Secondary | ICD-10-CM | POA: Insufficient documentation

## 2016-10-05 DIAGNOSIS — Z9221 Personal history of antineoplastic chemotherapy: Secondary | ICD-10-CM | POA: Insufficient documentation

## 2016-10-05 DIAGNOSIS — A630 Anogenital (venereal) warts: Secondary | ICD-10-CM | POA: Insufficient documentation

## 2016-10-05 MED ORDER — VALACYCLOVIR HCL 1 G PO TABS: 1000.0000 mg | ORAL_TABLET | Freq: Two times a day (BID) | ORAL | 0 refills | Status: DC

## 2016-10-05 NOTE — ED Triage Notes (Signed)
Patient presents to the ED with difficulty using his left wrist approx. 4 days ago.  Patient denies injury and denies pain.

## 2016-10-05 NOTE — ED Provider Notes (Signed)
[facility] Emergency Department Provider Note   ____________________________________________   First MD Initiated Contact with Patient 10/05/16 1300     (approximate)  I have reviewed the triage vital signs and the nursing notes.   HISTORY  Chief Complaint Wrist Pain    HPI Philip Cordova is a 51 y.o. male is complaining of left wrist drop for 4 days. Patient state he cannot actively extend his wrist. Patient states he can use his right hand to move his left wrist to all range of motion without pain. Patient denies any history of trauma. Patient is recently finished chemotherapy or radiation treatment for cancerous condition involving the head and neck. Patient is having reliant on narcotic pain medication for his cancer condition. Patient is very sleepy but easily aroused. Is right-hand dominant. Past Medical History:  Diagnosis Date  . Asthma   . 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 Cancer [facility])     Patient Active Problem List   Diagnosis Date Noted  . Dehydration 09/03/2016  . Mucositis due to antineoplastic therapy 08/25/2016  . Dehydration with hyponatremia 08/25/2016  . Oral bleeding 08/25/2016  . Dysgeusia 08/25/2016  . Insomnia due to medical condition 08/25/2016  . 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 Cervical lymphadenopathy 04/27/2016  . 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 Microcytic hypochromic anemia 01/08/2014  . Asthma 06/22/2012    Past Surgical History:  Procedure Laterality Date  . APPENDECTOMY  2008  . DIAGNOSTIC LARYNGOSCOPY  06/17/2016   Procedure: DIAGNOSTIC LARYNGOSCOPY WITH BIOPSIES;  Surgeon: Carloyn Manner, MD;  Location: [facility] ORS;  Service: ENT;;  . Moles removed  2005  . RHINOPLASTY  1990  . TONSILLECTOMY N/A 06/17/2016   Procedure: TONSILLECTOMY;  Surgeon: Carloyn Manner, MD;  Location: [facility] ORS;  Service: ENT;  Laterality: N/A;    Prior to Admission  medications   Medication Sig Start Date End Date Taking? Authorizing Provider  Artificial Saliva (BIOTENE ORALBALANCE DRY MOUTH) GEL Use as directed 1 application in the mouth or throat 4 (four) times daily as needed (for mouth pain). Patient not taking: Reported on 10/05/2016 08/20/16   Lloyd Huger, MD  aspirin EC 81 MG tablet Take 81 mg by mouth daily.    Historical Provider, MD  B Complex-Biotin-FA (SUPER B-100) TABS Take 1 tablet by mouth daily.    Historical Provider, MD  bisacodyl (DULCOLAX) 5 MG EC tablet Take 10 mg by mouth daily as needed for moderate constipation.    Historical Provider, MD  calcium carbonate 100 mg/ml SUSP  07/04/09   Historical Provider, MD  ciprofloxacin-dexamethasone (CIPRODEX) otic suspension Place 4 drops into the right ear 2 (two) times daily. Patient not taking: Reported on 10/05/2016 09/14/16   Noreene Filbert, MD  dexamethasone (DECADRON) 4 MG tablet On 09-22-16 Begin 1 tablet daily for 7 days, On 09-29-16 Begin 1/2 tablet for 7 days and on 10/06/16 Begin 1/2 every other day until prescription is complete. 09/17/16   Noreene Filbert, MD  Ergocalciferol (VITAMIN D2 PO)  07/04/09   Historical Provider, MD  fluconazole (DIFLUCAN) 100 MG tablet Take 1.5 tablets (150 mg total) by mouth daily. Patient not taking: Reported on 09/15/2016 08/24/16   Noreene Filbert, MD  GLUCOSAMINE-CHONDROITIN PO Take by mouth.    Historical Provider, MD  lansoprazole (PREVACID SOLUTAB) 30 MG disintegrating tablet Take 1 tablet (30 mg total) by mouth daily. Patient not taking: Reported on 10/05/2016 09/02/16   Noreene Filbert,  MD  Melatonin 10 MG TABS Take 1 tablet by mouth at bedtime.    Historical Provider, MD  Multiple Vitamins-Minerals (MULTIVITAMIN WITH MINERALS) tablet Take by mouth. 11/21/08   Historical Provider, MD  ondansetron (ZOFRAN) 8 MG tablet One pill every 8 hours as needed for nausea/ vomitting. Patient not taking: Reported on 10/05/2016 07/02/16   Cammie Sickle, MD  oxyCODONE  (ROXICODONE) 15 MG immediate release tablet Take 1 tablet (15 mg total) by mouth every 4 (four) hours as needed for pain. Patient not taking: Reported on 10/05/2016 09/27/16   Cammie Sickle, MD  polyethylene glycol (MIRALAX / GLYCOLAX) packet Take 17 g by mouth 3 (three) times daily as needed.    Historical Provider, MD  prochlorperazine (COMPAZINE) 10 MG tablet Take 1 tablet (10 mg total) by mouth every 6 (six) hours as needed for nausea or vomiting. Patient not taking: Reported on 09/15/2016 07/02/16   Cammie Sickle, MD  senna (SENOKOT) 8.6 MG TABS tablet Take 2 tablets (17.2 mg total) by mouth at bedtime. Patient not taking: Reported on 10/05/2016 08/31/16   Sindy Guadeloupe, MD  sucralfate (CARAFATE) 1 g tablet Take 1 tablet (1 g total) by mouth 3 (three) times daily. Dissolve in 2-3 tbsp warm water, swish and swallow Patient not taking: Reported on 10/05/2016 07/20/16   Noreene Filbert, MD  valACYclovir (VALTREX) 1000 MG tablet Take 1 tablet (1,000 mg total) by mouth 2 (two) times daily. Patient not taking: Reported on 10/05/2016 08/19/16   Sindy Guadeloupe, MD  zolpidem (AMBIEN) 10 MG tablet Take 1 tablet (10 mg total) by mouth at bedtime as needed for sleep. 08/25/16 09/24/16  Heath Lark, MD    Allergies Milk-related compounds  Family History  Problem Relation Age of Onset  . Cancer Father     Skin  . Hyperlipidemia Father   . Hypertension Father     Possible  . Diabetes Paternal Uncle     Social History Social History  Substance Use Topics  . Smoking status: Former Smoker    Years: 1.00    Types: Cigarettes  . Smokeless tobacco: Never Used     Comment: "YEARS AGO"  . Alcohol use No     Comment: Occasionally    Review of Systems Constitutional: No fever/chills Eyes: No visual changes. ENT: No sore throat. Cardiovascular: Denies chest pain. Respiratory: Denies shortness of breath. Gastrointestinal: No abdominal pain.  No nausea, no vomiting.  No diarrhea.  No  constipation. Genitourinary: Negative for dysuria. Musculoskeletal: Negative for back pain. Skin: Negative for rash. Neurological: His left wrist  Allergic/Immunilogical: Milk products ____________________________________________   PHYSICAL EXAM:  VITAL SIGNS: ED Triage Vitals  Enc Vitals Group     BP 10/05/16 1136 104/65     Pulse Rate 10/05/16 1136 71     Resp 10/05/16 1136 18     Temp 10/05/16 1136 97.8 F (36.6 C)     Temp Source 10/05/16 1136 Oral     SpO2 10/05/16 1136 100 %     Weight 10/05/16 1137 160 lb (72.6 kg)     Height 10/05/16 1137 6\' 1"  (1.854 m)     Head Circumference --      Peak Flow --      Pain Score 10/05/16 1137 0     Pain Loc --      Pain Edu? --      Excl. in [place]? --     Constitutional:Drowsy  but orientated 3. Well appearing and in  no acute distress. Eyes: Conjunctivae are normal. PERRL. EOMI. Head: Atraumatic. Nose: No congestion/rhinnorhea. Mouth/Throat: Mucous membranes are moist.  Oropharynx non-erythematous. Neck: No stridor.  No cervical spine tenderness to palpation. Hematological/Lymphatic/Immunilogical: No cervical lymphadenopathy. Cardiovascular: Normal rate, regular rhythm. Grossly normal heart sounds.  Good peripheral circulation. Respiratory: Normal respiratory effort.  No retractions. Lungs CTAB. Gastrointestinal: Soft and nontender. No distention. No abdominal bruits. No CVA tenderness. Musculoskeletal: Wrist is in a flexed position. Patient state unable to actively extend her wrist. I was able to put the wrist to a full range of motion for her complaint of pain. Return back to flexed position after my manipulation. Neurologic:  Normal speech and language.  No gait instability. Skin:  Skin is warm, dry and intact. No rash noted. Psychiatric: Mood and affect are normal. Speech and behavior are normal.  ____________________________________________   LABS (all labs ordered are listed, but only abnormal results are  displayed)  Labs Reviewed - No data to display ____________________________________________  EKG   ____________________________________________  RADIOLOGY  No acute findings x-ray of the left wrist ____________________________________________   PROCEDURES  Procedure(s) performed: None  Procedures  Critical Care performed: No  ____________________________________________   INITIAL IMPRESSION / ASSESSMENT AND PLAN / ED COURSE  Pertinent labs & imaging results that were available during my care of the patient were reviewed by me and considered in my medical decision making (see chart for details).  Radial Neuropathy left wrist. Patient given discharge care instructions. Patient placed in a wrist splint and advised follow-up with neurology by calling for an appointment morning. Advised the patient of my concern secondary to his drowsiness. Advised patient to consider calling someone to escort him home. Patient refuses he does sleep because been up longer day normally a stone today.    ____________________________________________   FINAL CLINICAL IMPRESSION(S) / ED DIAGNOSES  Final diagnoses:  Radial neuropathy, left      NEW MEDICATIONS STARTED DURING THIS VISIT:  Discharge Medication List as of 10/05/2016  2:14 PM       Note:  This document was prepared using Dragon voice recognition software and may include unintentional dictation errors.    Sable Feil, PA-C 10/05/16 1429    Earleen Newport, MD 10/05/16 [phone]

## 2016-10-05 NOTE — Progress Notes (Signed)
[facility] CONSULT NOTE  Patient Care Team: No Pcp Per Patient as PCP - General (General Practice)    Oncology History   # AUG 2017-STAGE III [T1N1]SQUAMOUS CELL CA [s/p FNA; Dr.Vaught]  LEFT NECK/Submandibular LN- necrotic [~2-2.5cm]; Right neck [85mm]; SEP 22nd PET- left neck uptake; ? Tonsillar uptake L>R; OCT 2017- Left Tonsil POSITIVE   # Low testosterone [on testosterone shots]     Squamous cell carcinoma of head and neck ([place])   05/18/2016 Initial Diagnosis    Squamous cell carcinoma of head and neck (HCC)       HISTORY OF PRESENTING ILLNESS:  Philip Cordova 51 y.o.  male with newly diagnosed squamous cell carcinoma S/P positive currently s/p definitive chemoradiation. Patient status post cycle #3 of cisplatin approximately 3 weeks ago; finishes RT- 01/22.  Patient noted to have weakness of his right upper extremity at the shoulder and also at the wrist. He is having difficulty with abduction. This has been going on for the last 3-4 days. He continues to take oxycodone as needed for pain. Complains of mild numbness around the left thumb.   Patient complains of mild-to-moderate soreness in the mouth. Difficulty/alteration of the taste. However is able to eat protein-based food.   ROS: A complete 10 point review of system is done which is negative except mentioned above in history of present illness  MEDICAL HISTORY:  Past Medical History:  Diagnosis Date  . Asthma   . 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 Cancer ([place])     SURGICAL HISTORY: Past Surgical History:  Procedure Laterality Date  . APPENDECTOMY  2008  . DIAGNOSTIC LARYNGOSCOPY  06/17/2016   Procedure: DIAGNOSTIC LARYNGOSCOPY WITH BIOPSIES;  Surgeon: Carloyn Manner, MD;  Location: [facility] ORS;  Service: ENT;;  . Moles removed  2005  . RHINOPLASTY  1990  . 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  Lives in [place].   Social History   Social History  . Marital status: Single    Spouse name: N/A  . Number of children: N/A  . Years of education: N/A   Occupational History  . Not on file.   Social History Main Topics  . Smoking status: Former Smoker    Years: 1.00    Types: Cigarettes  . Smokeless tobacco: Never Used     Comment: "YEARS AGO"  . Alcohol use No     Comment: Occasionally  . Drug use: No  . Sexual activity: Not on file   Other Topics Concern  . Not on file   Social History Narrative  . No narrative on file    FAMILY HISTORY:  Family History  Problem Relation Age of Onset  . Cancer Father     Skin  . Hyperlipidemia Father   . Hypertension Father     Possible  . Diabetes Paternal Uncle     ALLERGIES:  is allergic to milk-related compounds.  MEDICATIONS:  Current Outpatient Prescriptions  Medication Sig Dispense Refill  . dexamethasone (DECADRON) 4 MG tablet On 09-22-16 Begin 1 tablet daily for 7 days, On 09-29-16 Begin 1/2 tablet for 7 days and on 10/06/16 Begin 1/2 every other day until prescription is complete. 24 tablet 0  . Artificial Saliva (BIOTENE ORALBALANCE DRY MOUTH) GEL Use as directed 1 application in  the mouth or throat 4 (four) times daily as needed (for mouth pain). (Patient not taking: Reported on 10/05/2016) 2 Tube 1  . aspirin EC 81 MG tablet Take 81 mg by mouth daily.    . B Complex-Biotin-FA (SUPER B-100) TABS Take 1 tablet by mouth daily.    . bisacodyl (DULCOLAX) 5 MG EC tablet Take 10 mg by mouth daily as needed for moderate constipation.    . calcium carbonate 100 mg/ml SUSP     . ciprofloxacin-dexamethasone (CIPRODEX) otic suspension Place 4 drops into the right ear 2 (two) times daily. (Patient not taking: Reported on 10/05/2016) 7.5 mL 0  . Ergocalciferol (VITAMIN D2 PO)     . fluconazole (DIFLUCAN) 100 MG tablet Take 1.5 tablets  (150 mg total) by mouth daily. (Patient not taking: Reported on 09/15/2016) 5 tablet 0  . GLUCOSAMINE-CHONDROITIN PO Take by mouth.    . lansoprazole (PREVACID SOLUTAB) 30 MG disintegrating tablet Take 1 tablet (30 mg total) by mouth daily. (Patient not taking: Reported on 10/05/2016) 30 tablet 0  . Melatonin 10 MG TABS Take 1 tablet by mouth at bedtime.    . Multiple Vitamins-Minerals (MULTIVITAMIN WITH MINERALS) tablet Take by mouth.    . ondansetron (ZOFRAN) 8 MG tablet One pill every 8 hours as needed for nausea/ vomitting. (Patient not taking: Reported on 10/05/2016) 40 tablet 1  . oxyCODONE (ROXICODONE) 15 MG immediate release tablet Take 1 tablet (15 mg total) by mouth every 4 (four) hours as needed for pain. (Patient not taking: Reported on 10/05/2016) 60 tablet 0  . polyethylene glycol (MIRALAX / GLYCOLAX) packet Take 17 g by mouth 3 (three) times daily as needed.    . prochlorperazine (COMPAZINE) 10 MG tablet Take 1 tablet (10 mg total) by mouth every 6 (six) hours as needed for nausea or vomiting. (Patient not taking: Reported on 09/15/2016) 40 tablet 1  . senna (SENOKOT) 8.6 MG TABS tablet Take 2 tablets (17.2 mg total) by mouth at bedtime. (Patient not taking: Reported on 10/05/2016) 60 each 0  . sucralfate (CARAFATE) 1 g tablet Take 1 tablet (1 g total) by mouth 3 (three) times daily. Dissolve in 2-3 tbsp warm water, swish and swallow (Patient not taking: Reported on 10/05/2016) 90 tablet 3  . valACYclovir (VALTREX) 1000 MG tablet Take 1 tablet (1,000 mg total) by mouth 2 (two) times daily. (Patient not taking: Reported on 10/05/2016) 20 tablet 0  . zolpidem (AMBIEN) 10 MG tablet Take 1 tablet (10 mg total) by mouth at bedtime as needed for sleep. 30 tablet 0   No current facility-administered medications for this visit.       Marland Kitchen  PHYSICAL EXAMINATION: ECOG PERFORMANCE STATUS: 0 - Asymptomatic  Vitals:   10/05/16 0917  BP: 111/68  Pulse: 75  Temp: (!) 96.8 F (36 C)   Filed Weights    10/05/16 0917  Weight: 160 lb 0.9 oz (72.6 kg)    GENERAL: Well-nourished well-developed; Alert, no distress and comfortable. He is alone. EYES: no pallor or icterus OROPHARYNX: no thrush or ulceration; good dentition  NECK: supple, no masses felt LYMPH:  no palpable lymphadenopathy in the axillary or inguinal regions; appx ~1cm LN felt in left submandibular region [improved] LUNGS: clear to auscultation and  No wheeze or crackles HEART/CVS: regular rate & rhythm and no murmurs; No lower extremity edema ABDOMEN: abdomen soft, non-tender and normal bowel sounds Musculoskeletal:no cyanosis of digits and no clubbing  PSYCH: alert & oriented x 3 with fluent speech NEURO:  no focal motor/sensory deficits; except that pt has difficulty with abduction of his right upper extremity; shoulder; extension of the elbow and wrist. Posteriorly scapula- protruding.  SKIN:  no rashes or significant lesions  LABORATORY DATA:  I have reviewed the data as listed Lab Results  Component Value Date   WBC 8.5 09/15/2016   HGB 11.8 (L) 09/15/2016   HCT 33.7 (L) 09/15/2016   MCV 97.4 09/15/2016   PLT 262 09/15/2016    Recent Labs  08/19/16 1000 08/23/16 1519 09/03/16 1035 09/15/16 1238  NA 129* 133* 136 131*  K 4.2 4.4 4.6 4.1  CL 95* 94* 100* 94*  CO2 26 33* 25 28  GLUCOSE 119* 90 232* 127*  BUN 34* 37* 33* 32*  CREATININE 0.85 1.04 1.54* 0.98  CALCIUM 9.3 9.6 9.9 9.2  GFRNONAA >60 >60 52* >60  GFRAA >60 >60 60* >60  PROT 7.5  --  7.7 6.4*  ALBUMIN 4.1  --  4.1 3.7  AST 25  --  30 24  ALT 30  --  31 41  ALKPHOS 56  --  55 42  BILITOT 0.6  --  0.7 0.6    RADIOGRAPHIC STUDIES: I have personally reviewed the radiological images as listed and agreed with the findings in the report. No results found.  ASSESSMENT & PLAN:   Squamous cell carcinoma of head and neck (HCC) # Squamous cell carcinoma-  Left BOT/Tonsillar- T1N1- STAGE III HPV- positive/ p16 positive.currently on definitive chemo-  RT; with cisplatin 100 mg/m every 3 weeks. S/p cycle #3. Tolerating well without any major side effects other than usual expected side effects as discussed below.  Last RT- jan 22nd. Clinical improvement noted. Will order PET scan at next visit. See discussion below.   # left upper extremity  Weakness- ? Serratus anterior weakness vs central stroke- recommend going to ER now. Spoke to Dr.Crystal unlikely from Radiation.   # follow up in 1 week/lans/mag.    Cammie Sickle, MD 10/05/2016 1:33 PM

## 2016-10-05 NOTE — Progress Notes (Signed)
Patient here today for left arm numbness and unable to use hand, started about 4 days ago

## 2016-10-05 NOTE — Assessment & Plan Note (Addendum)
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 S/p cycle #3. Tolerating well without any major side effects other than usual expected side effects as discussed below.  Last RT- jan 22nd. Clinical improvement noted. Will order PET scan at next visit. See discussion below.   # left upper extremity  Weakness- ? Serratus anterior weakness vs central stroke- recommend going to ER now. Spoke to Dr.Crystal unlikely from Radiation.   # follow up in 1 week/lans/mag.

## 2016-10-05 NOTE — ED Notes (Signed)
See triage note  States he developed some diff using left wrist   Sx's started about 4 days ago  Denies any injury  No deformity  Positive pulses

## 2016-10-05 NOTE — Progress Notes (Signed)
Pt called requesting rf on acyclovir. rf sent to pharmacy

## 2016-10-05 NOTE — Discharge Instructions (Signed)
Wrist splint evaluation by neurologist. All in the morning for appointment. Patient is very drowsy secondary to ongoing narcotic pain medications for his cancer condition. Advised patient that he should arrange for an escort before departure.

## 2016-10-05 NOTE — ED Notes (Signed)
Pt states that he "has been here 4 hours and he just wanted to take a nap." Pt states she is not going to call someone for a ride. "I'm fine, I'm driving myself home." Pt ambulatory with steady gait, denies needing wheelchair.

## 2016-10-13 ENCOUNTER — Other Ambulatory Visit: Payer: Self-pay | Admitting: Oncology

## 2016-10-13 ENCOUNTER — Other Ambulatory Visit: Payer: Self-pay | Admitting: Radiation Oncology

## 2016-10-15 ENCOUNTER — Inpatient Hospital Stay (HOSPITAL_BASED_OUTPATIENT_CLINIC_OR_DEPARTMENT_OTHER): Payer: Self-pay | Admitting: Internal Medicine

## 2016-10-15 ENCOUNTER — Inpatient Hospital Stay: Payer: Self-pay

## 2016-10-15 VITALS — BP 99/62 | HR 85 | Temp 98.2°F | Wt 173.4 lb

## 2016-10-15 DIAGNOSIS — C099 Malignant neoplasm of tonsil, unspecified: Secondary | ICD-10-CM

## 2016-10-15 DIAGNOSIS — Z87891 Personal history of nicotine dependence: Secondary | ICD-10-CM

## 2016-10-15 DIAGNOSIS — Z923 Personal history of irradiation: Secondary | ICD-10-CM

## 2016-10-15 DIAGNOSIS — C760 Malignant neoplasm of head, face and neck: Secondary | ICD-10-CM

## 2016-10-15 DIAGNOSIS — Z79899 Other long term (current) drug therapy: Secondary | ICD-10-CM

## 2016-10-15 DIAGNOSIS — A630 Anogenital (venereal) warts: Secondary | ICD-10-CM

## 2016-10-15 DIAGNOSIS — Z9221 Personal history of antineoplastic chemotherapy: Secondary | ICD-10-CM

## 2016-10-15 DIAGNOSIS — R531 Weakness: Secondary | ICD-10-CM

## 2016-10-15 LAB — CBC WITH DIFFERENTIAL/PLATELET
BASOS PCT: 0 %
Basophils Absolute: 0 10*3/uL (ref 0–0.1)
EOS ABS: 0 10*3/uL (ref 0–0.7)
Eosinophils Relative: 0 %
HEMATOCRIT: 37.8 % — AB (ref 40.0–52.0)
Hemoglobin: 13 g/dL (ref 13.0–18.0)
Lymphocytes Relative: 5 %
Lymphs Abs: 0.6 10*3/uL — ABNORMAL LOW (ref 1.0–3.6)
MCH: 35 pg — ABNORMAL HIGH (ref 26.0–34.0)
MCHC: 34.3 g/dL (ref 32.0–36.0)
MCV: 101.9 fL — ABNORMAL HIGH (ref 80.0–100.0)
Monocytes Absolute: 1 10*3/uL (ref 0.2–1.0)
Monocytes Relative: 8 %
NEUTROS ABS: 11.4 10*3/uL — AB (ref 1.4–6.5)
Neutrophils Relative %: 87 %
Platelets: 278 10*3/uL (ref 150–440)
RBC: 3.71 MIL/uL — ABNORMAL LOW (ref 4.40–5.90)
RDW: 18.4 % — ABNORMAL HIGH (ref 11.5–14.5)
WBC: 13.1 10*3/uL — ABNORMAL HIGH (ref 3.8–10.6)

## 2016-10-15 LAB — BASIC METABOLIC PANEL
Anion gap: 11 (ref 5–15)
BUN: 36 mg/dL — ABNORMAL HIGH (ref 6–20)
CO2: 26 mmol/L (ref 22–32)
CREATININE: 0.87 mg/dL (ref 0.61–1.24)
Calcium: 9.1 mg/dL (ref 8.9–10.3)
Chloride: 94 mmol/L — ABNORMAL LOW (ref 101–111)
GFR calc Af Amer: >60 mL/min (ref >60)
GFR calc non Af Amer: >60 mL/min (ref >60)
Glucose, Bld: 141 mg/dL — ABNORMAL HIGH (ref 65–99)
Potassium: 3.7 mmol/L (ref 3.5–5.1)
SODIUM: 131 mmol/L — AB (ref 135–145)

## 2016-10-15 LAB — MAGNESIUM: MAGNESIUM: 2.2 mg/dL (ref 1.7–2.4)
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
## 2016-10-15 NOTE — Assessment & Plan Note (Addendum)
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 S/p cycle #3.  Last RT- jan 22nd. Clinical improvement noted. Patient declines follow-up with ENT because of financial reasons. A PET scan is ordered today- 8-12 weeks posttreatment.  # left upper extremity  Weakness- ? Serratus anterior weakness- secondary to Ulnar nerve injury. Deferred to orthopedic.  # Fatigue/poor appetite- no weight loss. Patient follows up with nutrition.   # Follow-up/ labs- PET scan 1st week of April; follow up few days later.

## 2016-10-15 NOTE — Progress Notes (Signed)
Patient here today for follow up.   Patient states he has no appetite and feels very weak, pt is currently taking prednisone given to him by ortho along with dexamethasone

## 2016-10-21 ENCOUNTER — Ambulatory Visit: Payer: Self-pay | Admitting: Radiation Oncology

## 2016-10-23 ENCOUNTER — Other Ambulatory Visit: Payer: Self-pay | Admitting: Hematology and Oncology

## 2016-10-23 ENCOUNTER — Telehealth: Payer: Self-pay | Admitting: Hematology and Oncology

## 2016-10-23 DIAGNOSIS — G4701 Insomnia due to medical condition: Secondary | ICD-10-CM

## 2016-10-23 MED ORDER — ZOLPIDEM TARTRATE 5 MG PO TABS: 5.0000 mg | ORAL_TABLET | Freq: Every evening | ORAL | 0 refills | Status: DC | PRN

## 2016-10-23 NOTE — Telephone Encounter (Signed)
Re:  Ambien  Patient at pharmacy requesting a refill of Ambien written on 08/25/2016 by Dr. Alvy Bimler at [facility] [phone]).  Noted Rx of Ambien 5 mg (dis #10) written by Dr. Rogue Bussing on 10/13/2016.  Not filled at [facility].  Refill for Ambien 5 mg tablets (dis #10).  [name], MD

## 2016-10-25 ENCOUNTER — Ambulatory Visit: Payer: Self-pay | Admitting: Radiation Oncology

## 2016-10-25 ENCOUNTER — Ambulatory Visit: Payer: Self-pay

## 2016-10-25 ENCOUNTER — Other Ambulatory Visit: Payer: Self-pay

## 2016-10-25 ENCOUNTER — Ambulatory Visit: Payer: Self-pay | Admitting: Internal Medicine

## 2016-11-01 ENCOUNTER — Emergency Department: Payer: Self-pay

## 2016-11-01 ENCOUNTER — Emergency Department
Admission: EM | Admit: 2016-11-01 | Discharge: 2016-11-01 | Disposition: A | Discharge status: Home / Self Care | Payer: Self-pay | Attending: Emergency Medicine | Admitting: Emergency Medicine

## 2016-11-01 ENCOUNTER — Telehealth: Payer: Self-pay | Admitting: *Deleted

## 2016-11-01 ENCOUNTER — Encounter: Payer: Self-pay | Admitting: Emergency Medicine

## 2016-11-01 DIAGNOSIS — R0789 Other chest pain: Secondary | ICD-10-CM

## 2016-11-01 DIAGNOSIS — J181 Lobar pneumonia, unspecified organism: Secondary | ICD-10-CM | POA: Insufficient documentation

## 2016-11-01 DIAGNOSIS — J189 Pneumonia, unspecified organism: Secondary | ICD-10-CM

## 2016-11-01 DIAGNOSIS — J45909 Unspecified asthma, uncomplicated: Secondary | ICD-10-CM | POA: Insufficient documentation

## 2016-11-01 DIAGNOSIS — Z79899 Other long term (current) drug therapy: Secondary | ICD-10-CM | POA: Insufficient documentation

## 2016-11-01 DIAGNOSIS — Z87891 Personal history of nicotine dependence: Secondary | ICD-10-CM | POA: Insufficient documentation

## 2016-11-01 LAB — CBC
HCT: 33.6 % — ABNORMAL LOW (ref 40.0–52.0)
HEMOGLOBIN: 11.8 g/dL — AB (ref 13.0–18.0)
MCH: 36.5 pg — ABNORMAL HIGH (ref 26.0–34.0)
MCHC: 35 g/dL (ref 32.0–36.0)
MCV: 104.1 fL — ABNORMAL HIGH (ref 80.0–100.0)
Platelets: 176 10*3/uL (ref 150–440)
RBC: 3.23 MIL/uL — AB (ref 4.40–5.90)
RDW: 16.1 % — ABNORMAL HIGH (ref 11.5–14.5)
WBC: 6.9 10*3/uL (ref 3.8–10.6)

## 2016-11-01 LAB — BASIC METABOLIC PANEL
ANION GAP: 7 (ref 5–15)
BUN: 21 mg/dL — ABNORMAL HIGH (ref 6–20)
CO2: 29 mmol/L (ref 22–32)
CREATININE: 0.75 mg/dL (ref 0.61–1.24)
Calcium: 9.2 mg/dL (ref 8.9–10.3)
Chloride: 101 mmol/L (ref 101–111)
Glucose, Bld: 90 mg/dL (ref 65–99)
Potassium: 3.9 mmol/L (ref 3.5–5.1)
SODIUM: 137 mmol/L (ref 135–145)

## 2016-11-01 LAB — TROPONIN I: Troponin I: <0.03 ng/mL (ref <0.03)

## 2016-11-01 MED ORDER — KETOROLAC TROMETHAMINE 30 MG/ML IJ SOLN
15.0000 mg | INTRAMUSCULAR | Status: AC
  Administered 2016-11-01: 15 mg via INTRAVENOUS
  Filled 2016-11-01: qty 1

## 2016-11-01 MED ORDER — KETOROLAC TROMETHAMINE 10 MG PO TABS: 10.0000 mg | ORAL_TABLET | Freq: Four times a day (QID) | ORAL | 0 refills | Status: DC | PRN

## 2016-11-01 MED ORDER — AZITHROMYCIN 250 MG PO TABS: ORAL_TABLET | ORAL | 0 refills | Status: DC

## 2016-11-01 MED ORDER — ALBUTEROL SULFATE (2.5 MG/3ML) 0.083% IN NEBU
5.0000 mg | INHALATION_SOLUTION | Freq: Once | RESPIRATORY_TRACT | Status: AC
  Administered 2016-11-01: 5 mg via RESPIRATORY_TRACT
  Filled 2016-11-01: qty 6

## 2016-11-01 MED ORDER — IOPAMIDOL (ISOVUE-370) INJECTION 76%
75.0000 mL | Freq: Once | INTRAVENOUS | Status: AC | PRN
  Administered 2016-11-01: 75 mL via INTRAVENOUS

## 2016-11-01 MED ORDER — SODIUM CHLORIDE 0.9 % IV BOLUS (SEPSIS)
1000.0000 mL | Freq: Once | INTRAVENOUS | Status: AC
  Administered 2016-11-01: 1000 mL via INTRAVENOUS

## 2016-11-01 NOTE — ED Provider Notes (Signed)
[facility] Emergency Department Provider Note  ____________________________________________  Time seen: Approximately 6:03 PM  I have reviewed the triage vital signs and the nursing notes.   HISTORY  Chief Complaint Shortness of Breath; ribcage pain; and Neck Pain    HPI Philip Cordova is a 51 y.o. male with a history of tongue and throat cancer status post chemotherapy and radiation who complains of sudden onset of pleuritic right chest pain that hurts with breathing and causes him to feel short of breath and is sharp and nonradiating and constant for the last 2 days. No cough no vomiting. Not exertional. Never had anything like this before. No fever.  Patient reports he has had delivered a very sedentary life with his recent diagnosis and treatment.   Past Medical History:  Diagnosis Date  . Asthma   . 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 Cancer [facility])      Patient Active Problem List   Diagnosis Date Noted  . Dehydration 09/03/2016  . Mucositis due to antineoplastic therapy 08/25/2016  . Dehydration with hyponatremia 08/25/2016  . Oral bleeding 08/25/2016  . Dysgeusia 08/25/2016  . Insomnia due to medical condition 08/25/2016  . Squamous cell carcinoma of head and neck ([place]) 05/18/2016  . Cervical lymphadenopathy 04/27/2016  . 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 Microcytic hypochromic anemia 01/08/2014  . Asthma 06/22/2012     Past Surgical History:  Procedure Laterality Date  . APPENDECTOMY  2008  . DIAGNOSTIC LARYNGOSCOPY  06/17/2016   Procedure: DIAGNOSTIC LARYNGOSCOPY WITH BIOPSIES;  Surgeon: Carloyn Manner, MD;  Location: [facility] ORS;  Service: ENT;;  . Moles removed  2005  . RHINOPLASTY  1990  . TONSILLECTOMY N/A 06/17/2016   Procedure: TONSILLECTOMY;  Surgeon: Carloyn Manner, MD;  Location: [facility] ORS;  Service: ENT;  Laterality: N/A;     Prior to Admission medications   Medication Sig Start Date  End Date Taking? Authorizing Provider  albuterol (PROVENTIL) (2.5 MG/3ML) 0.083% nebulizer solution Take 2.5 mg by nebulization every 6 (six) hours as needed for wheezing or shortness of breath.   Yes Historical Provider, MD  bisacodyl (DULCOLAX) 5 MG EC tablet Take 10 mg by mouth daily as needed for moderate constipation.   Yes Historical Provider, MD  fluticasone-salmeterol (ADVAIR HFA) 230-21 MCG/ACT inhaler Inhale 2 puffs into the lungs 2 (two) times daily.   Yes Historical Provider, MD  Melatonin 10 MG TABS Take 1 tablet by mouth at bedtime.   Yes Historical Provider, MD  Multiple Vitamins-Minerals (MULTIVITAMIN WITH MINERALS) tablet Take by mouth. 11/21/08  Yes Historical Provider, MD  polyethylene glycol (MIRALAX / GLYCOLAX) packet Take 17 g by mouth 3 (three) times daily as needed.   Yes Historical Provider, MD  zolpidem (AMBIEN) 5 MG tablet Take 1 tablet (5 mg total) by mouth at bedtime as needed. for sleep 10/23/16  Yes [name], MD  Artificial Saliva (BIOTENE ORALBALANCE DRY MOUTH) GEL Use as directed 1 application in the mouth or throat 4 (four) times daily as needed (for mouth pain). Patient not taking: Reported on 10/05/2016 08/20/16   Lloyd Huger, MD  azithromycin (ZITHROMAX Z-PAK) 250 MG tablet Take 2 tablets (500 mg) on  Day 1,  followed by 1 tablet (250 mg) once daily on Days 2 through 5. 11/01/16   Carrie Mew, MD  dexamethasone (DECADRON) 4 MG tablet On 09-22-16 Begin 1 tablet daily for 7 days, On 09-29-16 Begin 1/2 tablet for 7 days and on  10/06/16 Begin 1/2 every other day until prescription is complete. 09/17/16   Noreene Filbert, MD  Ergocalciferol (VITAMIN D2 PO)  07/04/09   Historical Provider, MD  fluconazole (DIFLUCAN) 100 MG tablet Take 1.5 tablets (150 mg total) by mouth daily. Patient not taking: Reported on 09/15/2016 08/24/16   Noreene Filbert, MD  ketorolac (TORADOL) 10 MG tablet Take 1 tablet (10 mg total) by mouth every 6 (six) hours as needed for moderate  pain. 11/01/16   Carrie Mew, MD  lansoprazole (PREVACID SOLUTAB) 30 MG disintegrating tablet Take 1 tablet (30 mg total) by mouth daily. Patient not taking: Reported on 10/05/2016 09/02/16   Noreene Filbert, MD  ondansetron (ZOFRAN) 8 MG tablet One pill every 8 hours as needed for nausea/ vomitting. Patient not taking: Reported on 10/05/2016 07/02/16   Cammie Sickle, MD  oxyCODONE (ROXICODONE) 15 MG immediate release tablet Take 1 tablet (15 mg total) by mouth every 4 (four) hours as needed for pain. Patient not taking: Reported on 10/05/2016 09/27/16   Cammie Sickle, MD  prochlorperazine (COMPAZINE) 10 MG tablet Take 1 tablet (10 mg total) by mouth every 6 (six) hours as needed for nausea or vomiting. Patient not taking: Reported on 09/15/2016 07/02/16   Cammie Sickle, MD  senna (SENOKOT) 8.6 MG TABS tablet Take 2 tablets (17.2 mg total) by mouth at bedtime. Patient not taking: Reported on 10/05/2016 08/31/16   Sindy Guadeloupe, MD  sucralfate (CARAFATE) 1 g tablet Take 1 tablet (1 g total) by mouth 3 (three) times daily. Dissolve in 2-3 tbsp warm water, swish and swallow Patient not taking: Reported on 10/05/2016 07/20/16   Noreene Filbert, MD  valACYclovir (VALTREX) 1000 MG tablet Take 1 tablet (1,000 mg total) by mouth 2 (two) times daily. Patient not taking: Reported on 10/15/2016 10/05/16   Cammie Sickle, MD     Allergies Milk-related compounds   Family History  Problem Relation Age of Onset  . Cancer Father     Skin  . Hyperlipidemia Father   . Hypertension Father     Possible  . Diabetes Paternal Uncle     Social History Social History  Substance Use Topics  . Smoking status: Former Smoker    Years: 1.00    Types: Cigarettes  . Smokeless tobacco: Never Used     Comment: "YEARS AGO"  . Alcohol use No     Comment: Occasionally    Review of Systems  Constitutional:   No fever or chills.  ENT:   No sore throat. No rhinorrhea. Cardiovascular:   Positive as  above chest pain. Respiratory:   Positive shortness of breath without cough. Gastrointestinal:   Negative for abdominal pain, vomiting and diarrhea.  Genitourinary:   Negative for dysuria or difficulty urinating. Musculoskeletal:   Negative for focal pain or swelling Neurological:   Negative for headaches 10-point ROS otherwise negative.  ____________________________________________   PHYSICAL EXAM:  VITAL SIGNS: ED Triage Vitals  Enc Vitals Group     BP 11/01/16 1607 111/72     Pulse Rate 11/01/16 1607 65     Resp 11/01/16 1607 20     Temp 11/01/16 1607 98.1 F (36.7 C)     Temp Source 11/01/16 1607 Oral     SpO2 11/01/16 1607 98 %     Weight 11/01/16 1607 175 lb (79.4 kg)     Height 11/01/16 1607 6\' 1"  (1.854 m)     Head Circumference --      Peak Flow --  Pain Score 11/01/16 1629 10     Pain Loc --      Pain Edu? --      Excl. in [place]? --     Vital signs reviewed, nursing assessments reviewed.   Constitutional:   Alert and oriented. Well appearing and in no distress. Eyes:   No scleral icterus. No conjunctival pallor. PERRL. EOMI.  No nystagmus. ENT   Head:   Normocephalic and atraumatic.   Nose:   No congestion/rhinnorhea. No septal hematoma   Mouth/Throat:   MMM, no pharyngeal erythema. No peritonsillar mass.    Neck:   No stridor. No SubQ emphysema. No meningismus. Hematological/Lymphatic/Immunilogical:   No cervical lymphadenopathy. Cardiovascular:   Regular rate and rhythm. Symmetric bilateral radial and DP pulses.  No murmurs.  Respiratory:   Normal respiratory effort without tachypnea nor retractions. Breath sounds are clear and equal bilaterally. No wheezes/rales/rhonchi. Right anterolateral chest wall tender over the fourth fifth ribs reproducing the pain. No crepitus or deformity. Gastrointestinal:   Soft and nontender. Non distended. There is no CVA tenderness.  No rebound, rigidity, or guarding. Genitourinary:   deferred Musculoskeletal:    Normal range of motion in all extremities. No joint effusions.  No lower extremity tenderness.  No edema. Neurologic:   Normal speech and language.  CN 2-10 normal. Motor grossly intact. No gross focal neurologic deficits are appreciated.  Skin:    Skin is warm, dry and intact. No rash noted.  No petechiae, purpura, or bullae.  ____________________________________________    LABS (pertinent positives/negatives) (all labs ordered are listed, but only abnormal results are displayed) Labs Reviewed  BASIC METABOLIC PANEL - Abnormal; Notable for the following:       Result Value   BUN 21 (*)    All other components within normal limits  CBC - Abnormal; Notable for the following:    RBC 3.23 (*)    Hemoglobin 11.8 (*)    HCT 33.6 (*)    MCV 104.1 (*)    MCH 36.5 (*)    RDW 16.1 (*)    All other components within normal limits  TROPONIN I   ____________________________________________   EKG  Interpreted by me  Date: 11/01/2016  Rate: 65  Rhythm: normal sinus rhythm  QRS Axis: normal  Intervals: normal  ST/T Wave abnormalities: normal  Conduction Disutrbances: none  Narrative Interpretation: unremarkable      ____________________________________________    RADIOLOGY  Ct Angio Chest Pe W And/or Wo Contrast  Result Date: 11/01/2016 CLINICAL DATA:  RIGHT-sided chest pain. Head neck carcinoma. Short of breath. EXAM: CT ANGIOGRAPHY CHEST WITH CONTRAST TECHNIQUE: Multidetector CT imaging of the chest was performed using the standard protocol during bolus administration of intravenous contrast. Multiplanar CT image reconstructions and MIPs were obtained to evaluate the vascular anatomy. CONTRAST:  75 mL Isovue COMPARISON:  None. FINDINGS: Cardiovascular: No filling defects within pulmonary arteries to suggest acute pulmonary embolism. No acute findings of the aorta or great vessels. No pericardial fluid. Mediastinum/Nodes: No axillary supraclavicular adenopathy. No mediastinal  hilar adenopathy. No pericardial fluid. Esophagus normal. Lungs/Pleura: Rounded density in the inferior RIGHT lower lobe laterally measuring 2.7 by 1.4 cm likely represents a focus of atelectasis. Infarction or pneumonia is less likely. No pneumothorax. No pulmonary edema. There is small amount of dependent material within the upper trachea (image 22, series 6) Upper Abdomen: Limited view of the liver, kidneys, pancreas are unremarkable. Normal adrenal glands. Musculoskeletal: No aggressive osseous lesion. Review of the MIP images confirms the  above findings. IMPRESSION: 1. No evidence acute pulmonary embolism. 2. Small focus of atelectasis versus small infiltrate in the RIGHT lower lobe (see description above). 3. No lymphadenopathy. Electronically Signed   By: Suzy Bouchard M.D.   On: 11/01/2016 18:24   Chest x-ray reviewed by me. Unremarkable. No pneumothorax rib fracture. No consolidation. ____________________________________________   PROCEDURES Procedures  ____________________________________________   INITIAL IMPRESSION / ASSESSMENT AND PLAN / ED COURSE  Pertinent labs & imaging results that were available during my care of the patient were reviewed by me and considered in my medical decision making (see chart for details).  Patient cancer history presents with pleuritic chest pain that is constant. Concern for PE. Hemodynamically stable. Chest x-ray unremarkable. We'll get CT angiogram of the chest. Toradol for symptom relief. Exam suggests chest wall pain so if CT is negative we'll discharge home with symptom control.    ----------------------------------------- 6:37 PM on 11/01/2016 -----------------------------------------  CT negative for PE. Shows an area in the right lower lung likely atelectasis but possible infiltrate. We'll do a trial of azithromycin, Toradol for pain control, follow up with primary care.Considering the patient's symptoms, medical history, and physical  examination today, I have low suspicion for ACS, PE, TAD, pneumothorax, carditis, mediastinitis, CHF, or sepsis.         ____________________________________________   FINAL CLINICAL IMPRESSION(S) / ED DIAGNOSES  Final diagnoses:  Chest wall pain  Community acquired pneumonia of right lower lobe of lung (HCC)      New Prescriptions   AZITHROMYCIN (ZITHROMAX Z-PAK) 250 MG TABLET    Take 2 tablets (500 mg) on  Day 1,  followed by 1 tablet (250 mg) once daily on Days 2 through 5.   KETOROLAC (TORADOL) 10 MG TABLET    Take 1 tablet (10 mg total) by mouth every 6 (six) hours as needed for moderate pain.     Portions of this note were generated with dragon dictation software. Dictation errors may occur despite best attempts at proofreading.    Carrie Mew, MD 11/01/16 [phone]

## 2016-11-01 NOTE — Discharge Instructions (Signed)
Your CT scan does not show any blood clots in the lungs, but does show an abnormal area in the right lower lung that may be due to infection. Take azithromycin to help clear this up and follow-up with your primary care doctor for further evaluation of your symptoms.

## 2016-11-01 NOTE — ED Triage Notes (Signed)
Pt presents with pain to his neck and ribcage since Saturday. Pt recently finished radiation and chemo for neck and tonsil cancer. Pt states that he has felt fine recently but that he began to have sob on Saturday after spending a lot of time up and around, holding his neck up, which is unusual for him (he spent hours without resting his neck). Pt alert & oriented with NAD noted.

## 2016-11-01 NOTE — Telephone Encounter (Signed)
Called to report that he is having trouble breathing since Saturday night due to "right intercostal rib pain when I breath in more than 20 %" I spoke with Dr Rogue Bussing and he advises that he go to ER for posibilty of PE and he needs a CT. Patient advised of this and agrees to go

## 2016-11-04 ENCOUNTER — Ambulatory Visit (INDEPENDENT_AMBULATORY_CARE_PROVIDER_SITE_OTHER): Payer: Self-pay | Admitting: Orthopaedic Surgery

## 2016-11-04 ENCOUNTER — Encounter (INDEPENDENT_AMBULATORY_CARE_PROVIDER_SITE_OTHER): Payer: Self-pay | Admitting: Orthopaedic Surgery

## 2016-11-04 DIAGNOSIS — G5632 Lesion of radial nerve, left upper limb: Secondary | ICD-10-CM | POA: Insufficient documentation

## 2016-11-04 NOTE — Addendum Note (Signed)
Addended by: Precious Bard on: 11/04/2016 09:46 AM   Modules accepted: Orders

## 2016-11-04 NOTE — Progress Notes (Signed)
Office Visit Note   Patient: Philip Cordova           Date of Birth: 1966/04/10           MRN: [id number] Visit Date: 11/04/2016              Requested by: No referring provider defined for this encounter. PCP: No PCP Per Patient   Assessment & Plan: Visit Diagnoses: No diagnosis found.  Plan: Wrist brace to help support the wrist drop. We'll order nerve conduction study to evaluate the full extent of the radial nerve palsy.  Follow-Up Instructions: Return in about 2 weeks (around 11/18/2016).   Orders:  No orders of the defined types were placed in this encounter.  No orders of the defined types were placed in this encounter.     Procedures: No procedures performed   Clinical Data: No additional findings.   Subjective: Chief Complaint  Patient presents with  . Left Wrist - Pain    Patient is a 51 year old young man who is very anxious was had 6 weeks of wrist drop of insidious onset. He denies any injuries. He endorses numbness in the superficial radial nerve distribution. He saw another orthopedist weeks ago and was told to give more time. He denies any pain. He does work as a Doctor, general practice    Review of Systems  Constitutional: Negative.   All other systems reviewed and are negative.    Objective: Vital Signs: There were no vitals taken for this visit.  Physical Exam  Constitutional: He is oriented to person, place, and time. He appears well-developed and well-nourished.  HENT:  Head: Normocephalic and atraumatic.  Eyes: Pupils are equal, round, and reactive to light.  Neck: Neck supple.  Pulmonary/Chest: Effort normal.  Abdominal: Soft.  Musculoskeletal: Normal range of motion.  Neurological: He is alert and oriented to person, place, and time.  Skin: Skin is warm.  Psychiatric: He has a normal mood and affect. His behavior is normal. Judgment and thought content normal.  Nursing note and vitals reviewed.   Ortho Exam Exam of the left upper extremity  shows a radial nerve palsy.  Positive Tinel at radial tunnel. Negative Tinel at radial groove. He has strong pulses. Sensation is intact except for thumb. Specialty Comments:  No specialty comments available.  Imaging: No results found.   PMFS History: Patient Active Problem List   Diagnosis Date Noted  . Dehydration 09/03/2016  . Mucositis due to antineoplastic therapy 08/25/2016  . Dehydration with hyponatremia 08/25/2016  . Oral bleeding 08/25/2016  . Dysgeusia 08/25/2016  . Insomnia due to medical condition 08/25/2016  . 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 Cervical lymphadenopathy 04/27/2016  . 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 Microcytic hypochromic anemia 01/08/2014  . Asthma 06/22/2012   Past Medical History:  Diagnosis Date  . Asthma   . 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 Cancer ([place])     Family History  Problem Relation Age of Onset  . Cancer Father     Skin  . Hyperlipidemia Father   . Hypertension Father     Possible  . Diabetes Paternal Uncle     Past Surgical History:  Procedure Laterality Date  . APPENDECTOMY  2008  . DIAGNOSTIC LARYNGOSCOPY  06/17/2016   Procedure: DIAGNOSTIC LARYNGOSCOPY WITH BIOPSIES;  Surgeon: Carloyn Manner, MD;  Location: [facility] ORS;  Service: ENT;;  . Moles removed  2005  . RHINOPLASTY  1990  .  TONSILLECTOMY N/A 06/17/2016   Procedure: TONSILLECTOMY;  Surgeon: Carloyn Manner, MD;  Location: [facility] ORS;  Service: ENT;  Laterality: N/A;   Social History   Occupational History  . Not on file.   Social History Main Topics  . Smoking status: Former Smoker    Years: 1.00    Types: Cigarettes  . Smokeless tobacco: Never Used     Comment: "YEARS AGO"  . Alcohol use No     Comment: Occasionally  . Drug use: No  . Sexual activity: Not on file

## 2016-11-09 ENCOUNTER — Ambulatory Visit (INDEPENDENT_AMBULATORY_CARE_PROVIDER_SITE_OTHER): Payer: Self-pay | Admitting: Orthopaedic Surgery

## 2016-11-10 ENCOUNTER — Encounter: Payer: Self-pay | Admitting: Family Medicine

## 2016-11-10 ENCOUNTER — Ambulatory Visit (INDEPENDENT_AMBULATORY_CARE_PROVIDER_SITE_OTHER): Payer: Self-pay | Admitting: Family Medicine

## 2016-11-10 VITALS — BP 102/76 | HR 74 | Temp 98.2°F | Ht 73.0 in | Wt 172.0 lb

## 2016-11-10 DIAGNOSIS — C760 Malignant neoplasm of head, face and neck: Secondary | ICD-10-CM

## 2016-11-10 DIAGNOSIS — F419 Anxiety disorder, unspecified: Secondary | ICD-10-CM

## 2016-11-10 DIAGNOSIS — J189 Pneumonia, unspecified organism: Secondary | ICD-10-CM | POA: Insufficient documentation

## 2016-11-10 DIAGNOSIS — G5632 Lesion of radial nerve, left upper limb: Secondary | ICD-10-CM

## 2016-11-10 DIAGNOSIS — F329 Major depressive disorder, single episode, unspecified: Secondary | ICD-10-CM | POA: Insufficient documentation

## 2016-11-10 DIAGNOSIS — F32A Depression, unspecified: Secondary | ICD-10-CM | POA: Insufficient documentation

## 2016-11-10 DIAGNOSIS — F418 Other specified anxiety disorders: Secondary | ICD-10-CM

## 2016-11-10 NOTE — Assessment & Plan Note (Signed)
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 Has a scheduled nerve conduction study next week. Encouraged him to keep this and then likely will need occupational therapy.

## 2016-11-10 NOTE — Assessment & Plan Note (Signed)
Following with oncology for this. He will continue to follow with them.

## 2016-11-10 NOTE — Assessment & Plan Note (Signed)
Related to significant life changes with left radial nerve palsy and cancer. Patient notes no SI or HI. Notes he does not want to try any medications and cannot afford a therapist. He believes he will feel better with regards to this once he has an answer regarding his left hand. Discussed continuing to monitor. Following up with me in about 2 months. Advised if he develops any thoughts of harming himself he needs to go to the emergency room immediately.

## 2016-11-10 NOTE — Assessment & Plan Note (Signed)
Patient diagnosed with pneumonia about a week and a half ago. Finished antibiotics. He has acutely improved. Lungs sound clear. Vital signs are stable. He'll return in 2-3 weeks for repeat chest x-ray to check for resolution. If symptoms return he'll be reevaluated.

## 2016-11-10 NOTE — Progress Notes (Signed)
Pre visit review using our clinic review tool, if applicable. No additional management support is needed unless otherwise documented below in the visit note. 

## 2016-11-10 NOTE — Progress Notes (Signed)
Tommi Rumps, MD Phone: [phone]  Philip Cordova is a 51 y.o. male who presents today for new patient visit.  Pneumonia: Patient seen in the ED about a week and a half ago for right-sided lower rib pleurisy type pain. Had no chest pain. Noted some shortness of breath and cough at that time. Had workup in the emergency room revealing likely right lower lobe pneumonia. Treated with azithromycin. And breathing treatments. Notes no pain. Minimal cough. No shortness of breath. Has improved significantly.  Patient has a history of squamous cell carcinoma of the head and neck. Treated with radiation. Followed by oncology. He notes some trouble swallowing related to this. Currently takes oxycodone for pain.  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 He notes he feels depressed over his hand most of all. Has hand drop related to this. He notes no SI or HI. He is not interested in medication and cannot afford a counselor or therapist.  Left hand radial nerve palsy: Seeing orthopedics for this. Has a nerve conduction study set up for next week. Is really unable to use his left hand at all. Started right after he finished his treatment for his cancer.  Active Ambulatory Problems    Diagnosis Date Noted  . 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 Microcytic hypochromic anemia 01/08/2014  . Asthma 06/22/2012  . Cervical lymphadenopathy 04/27/2016  . 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 Mucositis due to antineoplastic therapy 08/25/2016  . Dehydration with hyponatremia 08/25/2016  . Oral bleeding 08/25/2016  . Dysgeusia 08/25/2016  . Insomnia due to medical condition 08/25/2016  . Dehydration 09/03/2016  . 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 Pneumonia 11/10/2016  . Anxiety and depression 11/10/2016   Resolved  Ambulatory Problems    Diagnosis Date Noted  . No Resolved Ambulatory Problems   Past Medical History:  Diagnosis Date  . Asthma   . Athlete's heart   . Cancer ([place])   . Chickenpox     Family History  Problem Relation Age of Onset  . Cancer Father     Skin  . Hyperlipidemia Father   . Hypertension Father     Possible  . Diabetes Paternal Uncle     Social History   Social History  . Marital status: Single    Spouse name: N/A  . Number of children: N/A  . Years of education: N/A   Occupational History  . Not on file.   Social History Main Topics  . Smoking status: Former Smoker    Years: 1.00    Types: Cigarettes  . Smokeless tobacco: Never Used     Comment: "YEARS AGO"  . Alcohol use No     Comment: Occasionally  . Drug use: No  . Sexual activity: Not on file   Other Topics Concern  . Not on file   Social History Narrative  . No narrative on file    ROS  General:  Negative for nexplained weight loss, fever Skin: Negative for new or changing mole, sore that won't heal HEENT: Positive for sores in mouth, trouble swallowing, Negative for trouble hearing, trouble seeing, ringing in ears, hoarseness, change in voice. CV:  Negative for chest pain, edema, palpitations Resp: Positive for cough, dyspnea, negative for hemoptysis GI: Negative for nausea, vomiting, diarrhea, constipation, abdominal pain, melena, hematochezia. GU: Negative for  dysuria, incontinence, urinary hesitance, hematuria, vaginal or penile discharge, polyuria, sexual difficulty, lumps in testicle or breasts MSK: Negative for muscle cramps or aches, joint pain or swelling Neuro: Positive for left hand numbness and weakness, Negative for headaches, dizziness, passing out/fainting Psych: Positive for depression, anxiety, negative for memory problems  Objective  Physical Exam Vitals:   11/10/16 0905  BP: 102/76  Pulse: 74  Temp: 98.2 F (36.8 C)    BP Readings from Last 3 Encounters:    11/10/16 102/76  11/01/16 107/65  10/15/16 99/62   Wt Readings from Last 3 Encounters:  11/10/16 172 lb (78 kg)  11/01/16 175 lb (79.4 kg)  10/15/16 173 lb 6 oz (78.6 kg)    Physical Exam  Constitutional: No distress.  HENT:  Head: Normocephalic and atraumatic.  Mouth/Throat: Oropharynx is clear and moist. No oropharyngeal exudate.  Eyes: Conjunctivae are normal. Pupils are equal, round, and reactive to light.  Neck: Neck supple.  Cardiovascular: Normal rate, regular rhythm and normal heart sounds.   Pulmonary/Chest: Effort normal and breath sounds normal.  Abdominal: Soft. Bowel sounds are normal. He exhibits no distension. There is no tenderness. There is no rebound and no guarding.  Musculoskeletal: He exhibits no edema.  Lymphadenopathy:    He has no cervical adenopathy.  Neurological: He is alert. Gait normal.  Left hand unable to extend at the wrist, able to flex at the wrist, decreased movement in his thumb, index finger, and middle finger, decreased sensation over the index and middle finger, 3 out of 5 grip strength on the left, 5 out of 5 grip strength on the right, intact radial pulse on the left, good capillary refill on the left  Skin: Skin is warm and dry. He is not diaphoretic.  Psychiatric:  Mood depressed     Assessment/Plan:   Pneumonia Patient diagnosed with pneumonia about a week and a half ago. Finished antibiotics. He has acutely improved. Lungs sound clear. Vital signs are stable. He'll return in 2-3 weeks for repeat chest x-ray to check for resolution. If symptoms return he'll be reevaluated.  Squamous cell carcinoma of head and neck [facility]) Following with oncology for this. He will continue to follow with them.  Acute radial nerve palsy of left upper extremity Patient with left radial nerve palsy followed by orthopedics. Has a scheduled nerve conduction study next week. Encouraged him to keep this and then likely will need occupational  therapy.  Anxiety and depression Related to significant life changes with left radial nerve palsy and cancer. Patient notes no SI or HI. Notes he does not want to try any medications and cannot afford a therapist. He believes he will feel better with regards to this once he has an answer regarding his left hand. Discussed continuing to monitor. Following up with me in about 2 months. Advised if he develops any thoughts of harming himself he needs to go to the emergency room immediately.   Orders Placed This Encounter  Procedures  . DG Chest 2 View    Standing Status:   Future    Standing Expiration Date:   01/10/2018    Order Specific Question:   Reason for Exam (SYMPTOM  OR DIAGNOSIS REQUIRED)    Answer:   recently treated for PNA, improved symptoms, follow-up CXR for resolution    Order Specific Question:   Preferred imaging location?    Answer:   [facility]    No orders of the defined types were placed in this encounter.  Tommi Rumps, MD [place]

## 2016-11-10 NOTE — Patient Instructions (Addendum)
Nice to see you. We'll have a return in 2-3 weeks for repeat chest x-ray. Please keep your appointment for the nerve conduction study for left hand. If you develop thoughts of harming herself or others or recurrent pneumonia symptoms please seek medical attention immediately.

## 2016-11-17 ENCOUNTER — Encounter: Payer: Self-pay | Admitting: *Deleted

## 2016-11-17 ENCOUNTER — Encounter (INDEPENDENT_AMBULATORY_CARE_PROVIDER_SITE_OTHER): Payer: Self-pay | Admitting: Physical Medicine and Rehabilitation

## 2016-11-17 ENCOUNTER — Ambulatory Visit (INDEPENDENT_AMBULATORY_CARE_PROVIDER_SITE_OTHER): Payer: Self-pay | Admitting: Physical Medicine and Rehabilitation

## 2016-11-17 DIAGNOSIS — R531 Weakness: Secondary | ICD-10-CM

## 2016-11-17 DIAGNOSIS — R202 Paresthesia of skin: Secondary | ICD-10-CM

## 2016-11-17 DIAGNOSIS — M21332 Wrist drop, left wrist: Secondary | ICD-10-CM

## 2016-11-17 NOTE — Progress Notes (Signed)
Philip Cordova - 51 y.o. male MRN [id number]  Date of birth: 02-May-1966  Office Visit Note: Visit Date: 11/17/2016 PCP: Tommi Rumps, MD Referred by: [name], MD  Subjective: Chief Complaint  Patient presents with  . Left Wrist - Weakness  . Left Hand - Numbness   HPI: Mr. Steckman is a 51 year old right-hand dominant gentleman with history of head and neck cancer status post chemotherapy and XRT. Radiation treatment and did at the end of January of this year. He reported left wrist drop and weakness fairly abruptly on or about February 1. He called in to the oncologist on February 5 with symptoms. He saw the oncologist on February 6 and reported a 3 to four-day onset of abrupt weakness. He does not report so much in the way of pain but weakness and numbness. He was using hydrocodone for pain. The oncologist felt like this was not related to the radiation treatment. He did send the patient to the emergency room for evaluation. His thought at the time was possible stroke. The patient tells me today that he did not note any trauma during the early part of February and no specific injury to the left arm. 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 He is very depressed with the fact that it is this weak. The emergency room doctor diagnosed this as a radial neuropathy. X-rays of the left arm and wrist were unrevealing. No imaging of the head of her cervical spine was performed. They suggested he follow up with a neurologist. He saw an orthopedic surgeon in the interim and according to his oncologist was diagnosed with an ulnar nerve injury. The patient states that the orthopedic surgeon told him to give this time. Since that time he has seen Dr. Erlinda Hong in our office who suggested electrodiagnostic studies of the left upper extremity which we will perform today.   ROS Otherwise per HPI.  Assessment & Plan: Visit Diagnoses:  1. Paresthesia of skin   2. Weakness   3. Wrist  drop, left     Plan: No additional findings.  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 The lesion is characterized by motor demyelination with significant evidence of axonal injury. The denervation changes were all in the radial nerve muscles spanning different nerve roots and sparing muscles of the same nerve root but different peripheral nerves. There are no active motor unit action potentials which portends a poor prognosis for recovery.  EMG localization shows that this has spared the triceps muscle. There are no myokymia discharges noted - a finding often seen after radiation treatment. The nerve conductions are hard to interpret because of atrophy of the EIP muscle.  There is no significant electrodiagnostic evidence of any other focal nerve entrapment, brachial plexopathy or cervical radiculopathy.   Borderline slowing of the ulnar sensory nerve is likely temperature related.  Recommendations: 1.  Follow-up with referring physician. 2.  Continue current management of symptoms, continue splint, might consider brachial plexus MRI, repeat electrodiagnostic studies in 2 months.  Meds & Orders: No orders of the defined types were placed in this encounter.   Orders Placed This Encounter  Procedures  . NCV with EMG (electromyography)    Follow-up: Return for Dr. Erlinda Hong.   Procedures: No procedures performed  EMG & NCV Findings: Evaluation of the left radial motor nerve  showed prolonged distal onset latency (3.4 ms) and reduced amplitude (1.1 mV).  The left ulnar sensory nerve showed borderline reduced amplitude (12.9 V) and decreased conduction velocity (Wrist-5th Digit, 37 m/s).  All remaining nerves (as indicated in the following tables) were within normal limits.    Needle evaluation of the left extensor digitorum communis muscle  showed increased insertional activity, increased spontaneous activity, and diminished recruitment.  The left extensor indicis muscle showed decreased insertional activity, increased spontaneous activity, decreased motor unit amplitude, and diminished recruitment.  The left brachioradialis muscle showed increased insertional activity, widespread spontaneous activity, and diminished recruitment.  All remaining muscles (as indicated in the following table) showed no evidence of electrical instability.    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 The lesion is characterized by motor demyelination with significant evidence of axonal injury. The denervation changes were all in the radial nerve muscles spanning different nerve roots and sparing muscles of the same nerve root but different peripheral nerves. There are no active motor unit action potentials which portends a poor prognosis for recovery.  EMG localization shows that this has spared the triceps muscle. There are no myokymia discharges noted - a finding often seen after radiation treatment. The nerve conductions are hard to interpret because of atrophy of the EIP muscle.  There is no significant electrodiagnostic evidence of any other focal nerve entrapment, brachial plexopathy or cervical radiculopathy.   Borderline slowing of the ulnar sensory nerve is likely temperature related.  Recommendations: 1.  Follow-up with referring physician. 2.  Continue current management of symptoms, continue splint, might consider brachial plexus MRI, repeat electrodiagnostic studies in 2 months.   Nerve Conduction Studies Anti Sensory Summary Table   Stim Site NR Peak (ms) Norm Peak (ms) P-T Amp (V) Norm P-T Amp Site1 Site2 Delta-P (ms) Dist (cm) Vel (m/s) Norm Vel (m/s)  Left Median Acr Palm Anti Sensory  (2nd Digit)  30.8C  Wrist    3.4 <3.6 20.6 >10 Wrist Palm 1.6 0.0    Palm    1.8 <2.0 24.4         Left Radial Anti Sensory (Base 1st Digit)  30.9C  Wrist    2.5 <3.1 11.9  Wrist Base 1st Digit 2.5 0.0    Site 2    17.5  2.6         Left Ulnar Anti Sensory (5th Digit)  30.7C  Wrist    3.8 <4.0 *12.9 >15.0 Wrist 5th Digit 3.8 14.0 *37 >38   Motor Summary Table   Stim Site NR Onset (ms) Norm Onset (ms) O-P Amp (mV) Norm O-P Amp Site1 Site2 Delta-0 (ms) Dist (cm) Vel (m/s) Norm Vel (m/s)  Left Median Motor (Abd Poll Brev)  30.8C  Wrist    3.5 <4.2 6.5 >5 Elbow Wrist 4.2 25.0 60 >50  Elbow    7.7  7.0         Left Radial Motor (Ext Indicis)  30.1C  8cm    *3.4 <2.5 *1.1 >1.7 Up Arm 8cm 4.3 26.5 62 >40  Up Arm    7.7  1.0         Right Radial Motor (Ext Indicis)  32.7C  8cm    2.0 <2.5 1.8 >1.7 Up Arm 8cm 5.0 25.5 51 >40  Up Arm    7.0  4.0  Left Ulnar Motor (Abd Dig Min)  30.5C  Wrist    3.6 <4.2 6.8 >3 B Elbow Wrist 3.8 23.0 61 >53  B Elbow    7.4  7.4  A Elbow B Elbow 1.5 11.0 73 >53  A Elbow    8.9  7.4          EMG   Side Muscle Nerve Root Ins Act Fibs Psw Amp Dur Poly Recrt Int Fraser Din Comment  Left 1stDorInt Ulnar C8-T1 Nml Nml Nml Nml Nml 0 Nml Nml   Left Abd Poll Brev Median C8-T1 Nml Nml Nml Nml Nml 0 Nml Nml   Left Triceps Radial C6-7-8 Nml Nml Nml Nml Nml 0 Nml Nml   Left Deltoid Axillary C5-6 Nml Nml Nml Nml Nml 0 Nml Nml   Left ExtDigCom   *Incr *3+ *3+ Nml Nml 0 *Reduced Nml no MUAPs  Left ExtIndicis Radial (Post Int) C7-8 *Decr *3+ *3+ *Decr Nml 0 *Reduced Nml No MUAPs  Left BrachioRad Radial C5-6 *Incr *4+ *4+ Nml Nml 0 *Reduced Nml No MUAPs    Nerve Conduction Studies Anti Sensory Left/Right Comparison   Stim Site L Lat (ms) R Lat (ms) L-R Lat (ms) L Amp (V) R Amp (V) L-R Amp (%) Site1 Site2 L Vel (m/s) R Vel (m/s) L-R Vel (m/s)  Median Acr Palm Anti Sensory (2nd Digit)  30.8C  Wrist 3.4   20.6   Wrist Palm     Palm 1.8   24.4         Radial Anti  Sensory (Base 1st Digit)  30.9C  Wrist 2.5   11.9   Wrist Base 1st Digit     Site 2 17.5   2.6         Ulnar Anti Sensory (5th Digit)  30.7C  Wrist 3.8   *12.9   Wrist 5th Digit *37     Motor Left/Right Comparison   Stim Site L Lat (ms) R Lat (ms) L-R Lat (ms) L Amp (mV) R Amp (mV) L-R Amp (%) Site1 Site2 L Vel (m/s) R Vel (m/s) L-R Vel (m/s)  Median Motor (Abd Poll Brev)  30.8C  Wrist 3.5   6.5   Elbow Wrist 60    Elbow 7.7   7.0         Radial Motor (Ext Indicis)  30.1C  8cm *3.4 2.0 1.4 *1.1 1.8 38.9 Up Arm 8cm 62 51 11  Up Arm 7.7 7.0 0.7 1.0 4.0 75.0       Ulnar Motor (Abd Dig Min)  30.5C  Wrist 3.6   6.8   B Elbow Wrist 61    B Elbow 7.4   7.4   A Elbow B Elbow 73    A Elbow 8.9   7.4               Clinical History: No specialty comments available.  He reports that he has quit smoking. His smoking use included Cigarettes. He quit after 1.00 year of use. He has never used smokeless tobacco.   Recent Labs  04/14/16 1010  HGBA1C 5.4    Objective:  VS:  HT:    WT:   BMI:     BP:   HR: bpm  TEMP: ( )  RESP:  Physical Exam  Musculoskeletal:  Examination of the left arm shows atrophy of the hand intrinsic as well as EIP and radial forearm musculature. Normal bulk and tone of the triceps muscle. He has good elbow flexion and  extension. He has no active motion of wrist extension or finger extension. He can abduct the fingers and he can abduct thumb. He has intact sensation to light touch but he says he feels like it's numb on the inside. There is no swelling or allodynia there is temperature changes of the cool limb.    Ortho Exam Imaging: No results found.  Past Medical/Family/Surgical/Social History: Medications & Allergies reviewed per EMR Patient Active Problem List   Diagnosis Date Noted  . Pneumonia 11/10/2016  . Anxiety and depression 11/10/2016  . 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 Dehydration 09/03/2016  . Mucositis due  to antineoplastic therapy 08/25/2016  . Dehydration with hyponatremia 08/25/2016  . Oral bleeding 08/25/2016  . Dysgeusia 08/25/2016  . Insomnia due to medical condition 08/25/2016  . 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 Cervical lymphadenopathy 04/27/2016  . 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 Microcytic hypochromic anemia 01/08/2014  . Asthma 06/22/2012   Past Medical History:  Diagnosis Date  . Asthma   . 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 Cancer ([place])   . Chickenpox    Family History  Problem Relation Age of Onset  . Cancer Father     Skin  . Hyperlipidemia Father   . Hypertension Father     Possible  . Diabetes Paternal Uncle    Past Surgical History:  Procedure Laterality Date  . APPENDECTOMY  2008  . DIAGNOSTIC LARYNGOSCOPY  06/17/2016   Procedure: DIAGNOSTIC LARYNGOSCOPY WITH BIOPSIES;  Surgeon: Carloyn Manner, MD;  Location: [facility] ORS;  Service: ENT;;  . Moles removed  2005  . RHINOPLASTY  1990  . TONSILLECTOMY N/A 06/17/2016   Procedure: TONSILLECTOMY;  Surgeon: Carloyn Manner, MD;  Location: [facility] ORS;  Service: ENT;  Laterality: N/A;   Social History   Occupational History  . Not on file.   Social History Main Topics  . Smoking status: Former Smoker    Years: 1.00    Types: Cigarettes  . Smokeless tobacco: Never Used     Comment: "YEARS AGO"  . Alcohol use No     Comment: Occasionally  . Drug use: No  . Sexual activity: Not on file

## 2016-11-18 ENCOUNTER — Ambulatory Visit (INDEPENDENT_AMBULATORY_CARE_PROVIDER_SITE_OTHER): Payer: Self-pay | Admitting: Orthopaedic Surgery

## 2016-11-18 ENCOUNTER — Encounter (INDEPENDENT_AMBULATORY_CARE_PROVIDER_SITE_OTHER): Payer: Self-pay | Admitting: Orthopaedic Surgery

## 2016-11-18 ENCOUNTER — Encounter (INDEPENDENT_AMBULATORY_CARE_PROVIDER_SITE_OTHER): Payer: Self-pay | Admitting: Physical Medicine and Rehabilitation

## 2016-11-18 DIAGNOSIS — G5632 Lesion of radial nerve, left upper limb: Secondary | ICD-10-CM

## 2016-11-18 NOTE — Procedures (Signed)
EMG & NCV Findings: Evaluation of the left radial motor nerve showed prolonged distal onset latency (3.4 ms) and reduced amplitude (1.1 mV).  The left ulnar sensory nerve showed borderline reduced amplitude (12.9 V) and decreased conduction velocity (Wrist-5th Digit, 37 m/s).  All remaining nerves (as indicated in the following tables) were within normal limits.    Needle evaluation of the left extensor digitorum communis muscle showed increased insertional activity, increased spontaneous activity, and diminished recruitment.  The left extensor indicis muscle showed decreased insertional activity, increased spontaneous activity, decreased motor unit amplitude, and diminished recruitment.  The left brachioradialis muscle showed increased insertional activity, widespread spontaneous activity, and diminished recruitment.  All remaining muscles (as indicated in the following table) showed no evidence of electrical instability.    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 The lesion is characterized by motor demyelination with significant evidence of axonal injury. The denervation changes were all in the radial nerve muscles spanning different nerve roots and sparing muscles of the same nerve root but different peripheral nerves. There are no active motor unit action potentials which portends a poor prognosis for recovery.  EMG localization shows that this has spared the triceps muscle. There are no myokymia discharges noted - a finding often seen after radiation treatment. The nerve conductions are hard to interpret because of atrophy of the EIP muscle.  There is no significant electrodiagnostic evidence of any other focal nerve entrapment, brachial plexopathy or cervical radiculopathy.   Borderline slowing of the ulnar sensory nerve is likely temperature  related.  Recommendations: 1.  Follow-up with referring physician. 2.  Continue current management of symptoms, continue splint, might consider brachial plexus MRI, repeat electrodiagnostic studies in 2 months.   Nerve Conduction Studies Anti Sensory Summary Table   Stim Site NR Peak (ms) Norm Peak (ms) P-T Amp (V) Norm P-T Amp Site1 Site2 Delta-P (ms) Dist (cm) Vel (m/s) Norm Vel (m/s)  Left Median Acr Palm Anti Sensory (2nd Digit)  30.8C  Wrist    3.4 <3.6 20.6 >10 Wrist Palm 1.6 0.0    Palm    1.8 <2.0 24.4         Left Radial Anti Sensory (Base 1st Digit)  30.9C  Wrist    2.5 <3.1 11.9  Wrist Base 1st Digit 2.5 0.0    Site 2    17.5  2.6         Left Ulnar Anti Sensory (5th Digit)  30.7C  Wrist    3.8 <4.0 *12.9 >15.0 Wrist 5th Digit 3.8 14.0 *37 >38   Motor Summary Table   Stim Site NR Onset (ms) Norm Onset (ms) O-P Amp (mV) Norm O-P Amp Site1 Site2 Delta-0 (ms) Dist (cm) Vel (m/s) Norm Vel (m/s)  Left Median Motor (Abd Poll Brev)  30.8C  Wrist    3.5 <4.2 6.5 >5 Elbow Wrist 4.2 25.0 60 >50  Elbow    7.7  7.0         Left Radial Motor (Ext Indicis)  30.1C  8cm    *3.4 <2.5 *1.1 >1.7 Up Arm 8cm 4.3 26.5 62 >40  Up Arm    7.7  1.0         Right Radial Motor (Ext Indicis)  32.7C  8cm    2.0 <2.5 1.8 >1.7 Up Arm 8cm 5.0 25.5 51 >40  Up Arm  7.0  4.0         Left Ulnar Motor (Abd Dig Min)  30.5C  Wrist    3.6 <4.2 6.8 >3 B Elbow Wrist 3.8 23.0 61 >53  B Elbow    7.4  7.4  A Elbow B Elbow 1.5 11.0 73 >53  A Elbow    8.9  7.4          EMG   Side Muscle Nerve Root Ins Act Fibs Psw Amp Dur Poly Recrt Int Fraser Din Comment  Left 1stDorInt Ulnar C8-T1 Nml Nml Nml Nml Nml 0 Nml Nml   Left Abd Poll Brev Median C8-T1 Nml Nml Nml Nml Nml 0 Nml Nml   Left Triceps Radial C6-7-8 Nml Nml Nml Nml Nml 0 Nml Nml   Left Deltoid Axillary C5-6 Nml Nml Nml Nml Nml 0 Nml Nml   Left ExtDigCom   *Incr *3+ *3+ Nml Nml 0 *Reduced Nml no MUAPs  Left ExtIndicis Radial (Post Int) C7-8 *Decr *3+  *3+ *Decr Nml 0 *Reduced Nml No MUAPs  Left BrachioRad Radial C5-6 *Incr *4+ *4+ Nml Nml 0 *Reduced Nml No MUAPs    Nerve Conduction Studies Anti Sensory Left/Right Comparison   Stim Site L Lat (ms) R Lat (ms) L-R Lat (ms) L Amp (V) R Amp (V) L-R Amp (%) Site1 Site2 L Vel (m/s) R Vel (m/s) L-R Vel (m/s)  Median Acr Palm Anti Sensory (2nd Digit)  30.8C  Wrist 3.4   20.6   Wrist Palm     Palm 1.8   24.4         Radial Anti Sensory (Base 1st Digit)  30.9C  Wrist 2.5   11.9   Wrist Base 1st Digit     Site 2 17.5   2.6         Ulnar Anti Sensory (5th Digit)  30.7C  Wrist 3.8   *12.9   Wrist 5th Digit *37     Motor Left/Right Comparison   Stim Site L Lat (ms) R Lat (ms) L-R Lat (ms) L Amp (mV) R Amp (mV) L-R Amp (%) Site1 Site2 L Vel (m/s) R Vel (m/s) L-R Vel (m/s)  Median Motor (Abd Poll Brev)  30.8C  Wrist 3.5   6.5   Elbow Wrist 60    Elbow 7.7   7.0         Radial Motor (Ext Indicis)  30.1C  8cm *3.4 2.0 1.4 *1.1 1.8 38.9 Up Arm 8cm 62 51 11  Up Arm 7.7 7.0 0.7 1.0 4.0 75.0       Ulnar Motor (Abd Dig Min)  30.5C  Wrist 3.6   6.8   B Elbow Wrist 61    B Elbow 7.4   7.4   A Elbow B Elbow 73    A Elbow 8.9   7.4

## 2016-11-18 NOTE — Progress Notes (Signed)
   Office Visit Note   Patient: Philip Cordova           Date of Birth: 04-30-1966           MRN: [id number] Visit Date: 11/18/2016              Requested by: No referring provider defined for this encounter. PCP: Tommi Rumps, MD   Assessment & Plan: Visit Diagnoses:  1. 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 Nicoletta Dress at [facility] for PIN palsy.  Follow-Up Instructions: Return if symptoms worsen or fail to improve.   Orders:  Orders Placed This Encounter  Procedures  . Ambulatory referral to Orthopedic Surgery   No orders of the defined types were placed in this encounter.     Procedures: No procedures performed   Clinical Data: No additional findings.   Subjective: Chief Complaint  Patient presents with  . Left Wrist - Pain    Patient had nerve conduction study yesterday which showed severe PIN palsy.  Clinically he is no better.    Review of Systems   Objective: Vital Signs: There were no vitals taken for this visit.  Physical Exam  Ortho Exam PIN palsy of the left upper extremity. Specialty Comments:  No specialty comments available.  Imaging: No results found.   PMFS History: Patient Active Problem List   Diagnosis Date Noted  . Pneumonia 11/10/2016  . Anxiety and depression 11/10/2016  . 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 Dehydration 09/03/2016  . Mucositis due to antineoplastic therapy 08/25/2016  . Dehydration with hyponatremia 08/25/2016  . Oral bleeding 08/25/2016  . Dysgeusia 08/25/2016  . Insomnia due to medical condition 08/25/2016  . Squamous cell carcinoma of head and neck ([place]) 05/18/2016  . Cervical lymphadenopathy 04/27/2016  . 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 Microcytic hypochromic anemia 01/08/2014  . Asthma 06/22/2012   Past Medical History:  Diagnosis Date  . Asthma   . 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 Cancer  ([place])   . Chickenpox     Family History  Problem Relation Age of Onset  . Cancer Father     Skin  . Hyperlipidemia Father   . Hypertension Father     Possible  . Diabetes Paternal Uncle     Past Surgical History:  Procedure Laterality Date  . APPENDECTOMY  2008  . DIAGNOSTIC LARYNGOSCOPY  06/17/2016   Procedure: DIAGNOSTIC LARYNGOSCOPY WITH BIOPSIES;  Surgeon: Carloyn Manner, MD;  Location: [facility] ORS;  Service: ENT;;  . Moles removed  2005  . RHINOPLASTY  1990  . TONSILLECTOMY N/A 06/17/2016   Procedure: TONSILLECTOMY;  Surgeon: Carloyn Manner, MD;  Location: [facility] ORS;  Service: ENT;  Laterality: N/A;   Social History   Occupational History  . Not on file.   Social History Main Topics  . Smoking status: Former Smoker    Years: 1.00    Types: Cigarettes  . Smokeless tobacco: Never Used     Comment: "YEARS AGO"  . Alcohol use No     Comment: Occasionally  . Drug use: No  . Sexual activity: Not on file

## 2016-11-25 ENCOUNTER — Telehealth: Payer: Self-pay | Admitting: *Deleted

## 2016-11-25 NOTE — Telephone Encounter (Signed)
Pt contacted RN-Would like to know if he can drink water on the day of his pet scan. Pt informed that water was fine as long as it did not have any other additives. Pt states c/o excessive dry mouth despite drinking po fluid intake. He also wanted md to know that his wrist drop and weakness symptoms are permanent per his neurologist.  He also wanted to know if nuc med dept could draw his labs at his pet scan next week. I explained that nuc med would obtain a glucose check to ensure that pt could proceed with his test. He also inquired why he was scheduled for IV infusion next week in cancer center. Explained to patient that he is scheduled for possible IV magnesium.

## 2016-11-30 ENCOUNTER — Ambulatory Visit
Admission: RE | Admit: 2016-11-30 | Discharge: 2016-11-30 | Disposition: A | Discharge status: Home / Self Care | Payer: Self-pay | Source: Ambulatory Visit | Attending: Internal Medicine | Admitting: Internal Medicine

## 2016-11-30 DIAGNOSIS — C760 Malignant neoplasm of head, face and neck: Secondary | ICD-10-CM | POA: Insufficient documentation

## 2016-11-30 DIAGNOSIS — J439 Emphysema, unspecified: Secondary | ICD-10-CM | POA: Insufficient documentation

## 2016-11-30 LAB — GLUCOSE, CAPILLARY: GLUCOSE-CAPILLARY: 89 mg/dL (ref 65–99)

## 2016-11-30 MED ORDER — FLUDEOXYGLUCOSE F - 18 (FDG) INJECTION
12.0000 | Freq: Once | INTRAVENOUS | Status: AC | PRN
  Administered 2016-11-30: 12.18 via INTRAVENOUS

## 2016-12-02 ENCOUNTER — Inpatient Hospital Stay: Payer: Self-pay | Attending: Internal Medicine

## 2016-12-02 ENCOUNTER — Inpatient Hospital Stay (HOSPITAL_BASED_OUTPATIENT_CLINIC_OR_DEPARTMENT_OTHER): Payer: Self-pay | Admitting: Internal Medicine

## 2016-12-02 ENCOUNTER — Inpatient Hospital Stay: Payer: Self-pay

## 2016-12-02 VITALS — BP 93/59 | HR 60 | Temp 96.7°F | Resp 16 | Wt 182.2 lb

## 2016-12-02 DIAGNOSIS — Z923 Personal history of irradiation: Secondary | ICD-10-CM

## 2016-12-02 DIAGNOSIS — Z9221 Personal history of antineoplastic chemotherapy: Secondary | ICD-10-CM

## 2016-12-02 DIAGNOSIS — Z79899 Other long term (current) drug therapy: Secondary | ICD-10-CM | POA: Insufficient documentation

## 2016-12-02 DIAGNOSIS — C099 Malignant neoplasm of tonsil, unspecified: Secondary | ICD-10-CM | POA: Insufficient documentation

## 2016-12-02 DIAGNOSIS — R682 Dry mouth, unspecified: Secondary | ICD-10-CM

## 2016-12-02 DIAGNOSIS — F329 Major depressive disorder, single episode, unspecified: Secondary | ICD-10-CM | POA: Insufficient documentation

## 2016-12-02 DIAGNOSIS — J45909 Unspecified asthma, uncomplicated: Secondary | ICD-10-CM | POA: Insufficient documentation

## 2016-12-02 DIAGNOSIS — C760 Malignant neoplasm of head, face and neck: Secondary | ICD-10-CM

## 2016-12-02 DIAGNOSIS — Z87891 Personal history of nicotine dependence: Secondary | ICD-10-CM | POA: Insufficient documentation

## 2016-12-02 LAB — CBC WITH DIFFERENTIAL/PLATELET
BASOS PCT: 1 %
Basophils Absolute: 0 10*3/uL (ref 0–0.1)
Eosinophils Absolute: 0.1 10*3/uL (ref 0–0.7)
Eosinophils Relative: 2 %
HCT: 33.5 % — ABNORMAL LOW (ref 40.0–52.0)
HEMOGLOBIN: 11.7 g/dL — AB (ref 13.0–18.0)
LYMPHS ABS: 0.6 10*3/uL — AB (ref 1.0–3.6)
Lymphocytes Relative: 11 %
MCH: 35.4 pg — ABNORMAL HIGH (ref 26.0–34.0)
MCHC: 34.9 g/dL (ref 32.0–36.0)
MCV: 101.3 fL — ABNORMAL HIGH (ref 80.0–100.0)
Monocytes Absolute: 0.7 10*3/uL (ref 0.2–1.0)
Monocytes Relative: 12 %
NEUTROS ABS: 4.2 10*3/uL (ref 1.4–6.5)
NEUTROS PCT: 74 %
Platelets: 186 10*3/uL (ref 150–440)
RBC: 3.31 MIL/uL — ABNORMAL LOW (ref 4.40–5.90)
RDW: 13.4 % (ref 11.5–14.5)
WBC: 5.7 10*3/uL (ref 3.8–10.6)

## 2016-12-02 LAB — COMPREHENSIVE METABOLIC PANEL
ALBUMIN: 3.9 g/dL (ref 3.5–5.0)
ALT: 17 U/L (ref 17–63)
AST: 19 U/L (ref 15–41)
Alkaline Phosphatase: 37 U/L — ABNORMAL LOW (ref 38–126)
Anion gap: 5 (ref 5–15)
BUN: 17 mg/dL (ref 6–20)
CALCIUM: 9.3 mg/dL (ref 8.9–10.3)
CO2: 30 mmol/L (ref 22–32)
CREATININE: 0.88 mg/dL (ref 0.61–1.24)
Chloride: 102 mmol/L (ref 101–111)
GFR calc Af Amer: >60 mL/min (ref >60)
GFR calc non Af Amer: >60 mL/min (ref >60)
GLUCOSE: 109 mg/dL — AB (ref 65–99)
Potassium: 3.5 mmol/L (ref 3.5–5.1)
Sodium: 137 mmol/L (ref 135–145)
TOTAL PROTEIN: 6.7 g/dL (ref 6.5–8.1)
Total Bilirubin: 0.7 mg/dL (ref 0.3–1.2)

## 2016-12-02 LAB — MAGNESIUM: Magnesium: 2 mg/dL (ref 1.7–2.4)

## 2016-12-02 NOTE — Assessment & Plan Note (Addendum)
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 S/p cycle #3.  Last RT- jan 22nd.   Clinical improvement noted. April 3rd- PET scan- NED; improved 8 mm LN; no uptake.   # left upper extremity  Weakness- Acute radial nerve palsy? Etiology- patient frustrated.  # depression- declines any medications  # dry mouth- declines any interventions at this time; recommend continued frequent sips of water.   # follow up in 3 months/ labs. Recommend follow up with Dr.Vaught.   # I reviewed the blood work- with the patient in detail; also reviewed the imaging independently [as summarized above]; and with the patient in detail.

## 2016-12-02 NOTE — Progress Notes (Signed)
Patient here today for follow up.  Patient c/o unable to move left wrist
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
## 2016-12-02 NOTE — Progress Notes (Signed)
FYI- I asked pt to follow up/ Thx.

## 2017-01-14 ENCOUNTER — Ambulatory Visit (INDEPENDENT_AMBULATORY_CARE_PROVIDER_SITE_OTHER): Payer: Self-pay

## 2017-01-14 ENCOUNTER — Encounter: Payer: Self-pay | Admitting: Family Medicine

## 2017-01-14 ENCOUNTER — Ambulatory Visit (INDEPENDENT_AMBULATORY_CARE_PROVIDER_SITE_OTHER): Payer: Self-pay | Admitting: Family Medicine

## 2017-01-14 VITALS — BP 116/76 | HR 66 | Temp 98.2°F | Wt 186.2 lb

## 2017-01-14 DIAGNOSIS — F419 Anxiety disorder, unspecified: Secondary | ICD-10-CM

## 2017-01-14 DIAGNOSIS — G5632 Lesion of radial nerve, left upper limb: Secondary | ICD-10-CM

## 2017-01-14 DIAGNOSIS — F32A Depression, unspecified: Secondary | ICD-10-CM

## 2017-01-14 DIAGNOSIS — F329 Major depressive disorder, single episode, unspecified: Secondary | ICD-10-CM

## 2017-01-14 DIAGNOSIS — M542 Cervicalgia: Secondary | ICD-10-CM

## 2017-01-14 DIAGNOSIS — J189 Pneumonia, unspecified organism: Secondary | ICD-10-CM

## 2017-01-14 NOTE — Assessment & Plan Note (Signed)
Patient with neck pain and radiation of this. Neurologically at his baseline or slightly improved in his left hand compared to prior. We will obtain an x-ray of his neck. We'll determine next step in management based on that.

## 2017-01-14 NOTE — Assessment & Plan Note (Signed)
Due for repeat chest x-ray given prior pneumonia.

## 2017-01-14 NOTE — Assessment & Plan Note (Signed)
Patient with pretty significant depression and anxiety. Related to significant life changes with radial nerve palsy and his cancer. He does report he had suicidal ideation and did think of shooting himself about a month ago though reports he had no intent to do this. He notes this has resolved and he has no intent or plan now or thoughts of suicide now. He has not had any over the last 3-4 weeks. He is feeling somewhat better given that his left hand is improving. I discussed the seriousness of his prior thoughts. I advised that he see a psychiatrist or at least consider medication for this though he declines. We contracted for safety and he noted that he would seek medical attention if he developed these thoughts again. He will continue to monitor. He is given return precautions.

## 2017-01-14 NOTE — Progress Notes (Signed)
Tommi Rumps, MD Phone: [phone]  Philip Cordova is a 51 y.o. male who presents today for follow-up.  Depression/anxiety: Patient notes this got worse for little while. He was thinking about killing himself for a couple of days about a month ago. He thought about getting a gun though states he had no intent to do this. He notes he thought about his dog and stated he would not leave his dog alone so therefore would not hurt himself. He states he does not own a gun. He states he has not had any thoughts of killing himself or harming himself in the past 3-4 weeks. He has no intent or plan to harm himself currently. He does still feel depressed. He states he has his dog to live for. He feels more upbeat currently than he had been given that he has noticed improvement in his left hand where he can now extend the wrist and move all of his fingers.  He notes his left hand is improving to some degree. He has seen multiple specialists and they note there is very little they can do other than watch and wait. He is able to extend his wrist and move his fingers significantly more than previously. Not able to lift anything with the hand to any great degree. He notes the numbness has improved significantly as well.  He does note some neck pain on the left side. If he flexes his neck to any great degree he gets a shooting pain down his right and left side into his leg along the quadriceps. No injury to his neck. Notes this has been going on for the past couple of months. No new numbness or new weakness.  PMH: Former smoker   ROS see history of present illness  Objective  Physical Exam Vitals:   01/14/17 1540  BP: 116/76  Pulse: 66  Temp: 98.2 F (36.8 C)    BP Readings from Last 3 Encounters:  01/14/17 116/76  12/02/16 (!) 93/59  11/10/16 102/76   Wt Readings from Last 3 Encounters:  01/14/17 186 lb 3.2 oz (84.5 kg)  12/02/16 182 lb 4 oz (82.7 kg)  11/30/16 185 lb (83.9 kg)    Physical  Exam  Constitutional: He is well-developed, well-nourished, and in no distress.  Cardiovascular: Normal rate, regular rhythm and normal heart sounds.   Pulmonary/Chest: Effort normal and breath sounds normal.  Musculoskeletal:  No midline neck tenderness, no midline neck step off, no muscular neck or shoulder tenderness  Neurological: He is alert. Gait normal.  3/5 strength on wrist extension on the left side and on finger abduction and extension on the left side, 5/5 strength in bilateral biceps, triceps, grip, quads, hamstrings, plantar and dorsiflexion, sensation to light touch intact in bilateral UE and LE  Skin: Skin is warm and dry.     Assessment/Plan: Please see individual problem list.  Pneumonia Due for repeat chest x-ray given prior pneumonia.  Anxiety and depression Patient with pretty significant depression and anxiety. Related to significant life changes with radial nerve palsy and his cancer. He does report he had suicidal ideation and did think of shooting himself about a month ago though reports he had no intent to do this. He notes this has resolved and he has no intent or plan now or thoughts of suicide now. He has not had any over the last 3-4 weeks. He is feeling somewhat better given that his left hand is improving. I discussed the seriousness of his prior thoughts. I  advised that he see a psychiatrist or at least consider medication for this though he declines. We contracted for safety and he noted that he would seek medical attention if he developed these thoughts again. He will continue to monitor. He is given return precautions.  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 Following with orthopedics. Discussed staying active with his hand and wrist. He'll wear the brace at least at night and try to wear during the day as well.  Neck pain Patient with neck pain and radiation of this. Neurologically at his baseline or slightly improved  in his left hand compared to prior. We will obtain an x-ray of his neck. We'll determine next step in management based on that.   Orders Placed This Encounter  Procedures  . DG Cervical Spine Complete    Standing Status:   Future    Number of Occurrences:   1    Standing Expiration Date:   03/16/2018    Order Specific Question:   Reason for Exam (SYMPTOM  OR DIAGNOSIS REQUIRED)    Answer:   neck pain, paresthesias down side of body    Order Specific Question:   Preferred imaging location?    Answer:   [employer] Specific Question:   Radiology Contrast Protocol - do NOT remove file path    Answer:   [email or web address]   Tommi Rumps, MD [place]

## 2017-01-14 NOTE — Assessment & Plan Note (Signed)
Mildly improved from prior. Following with orthopedics. Discussed staying active with his hand and wrist. He'll wear the brace at least at night and try to wear during the day as well.

## 2017-01-14 NOTE — Patient Instructions (Signed)
Nice to see you. We'll get an x-ray of your neck and your chest to follow-up on your pneumonia and evaluate your neck pain. Please monitor your depression. If you have thoughts of harming yourself or killing yourself you need to seek medical attention immediately in the emergency room.

## 2017-02-11 ENCOUNTER — Encounter: Payer: Self-pay | Admitting: Family Medicine

## 2017-02-11 ENCOUNTER — Ambulatory Visit (INDEPENDENT_AMBULATORY_CARE_PROVIDER_SITE_OTHER): Payer: Self-pay | Admitting: Family Medicine

## 2017-02-11 VITALS — BP 100/68 | HR 67 | Temp 98.0°F | Wt 183.8 lb

## 2017-02-11 DIAGNOSIS — K1379 Other lesions of oral mucosa: Secondary | ICD-10-CM | POA: Insufficient documentation

## 2017-02-11 NOTE — Progress Notes (Signed)
  Tommi Rumps, MD Phone: [phone]  Philip Cordova is a 51 y.o. male who presents today for same-day visit.  Patient notes over the last week he has noted a small patch of clear bumps on his posterior right soft palate. There is no pain. No trouble swallowing. It has not enlarged or worsened though has not improved. He does have a history of head and neck cancer.  ROS see history of present illness  Objective  Physical Exam Vitals:   02/11/17 1327  BP: 100/68  Pulse: 67  Temp: 98 F (36.7 C)    BP Readings from Last 3 Encounters:  02/11/17 100/68  01/14/17 116/76  12/02/16 (!) 93/59   Wt Readings from Last 3 Encounters:  02/11/17 183 lb 12.8 oz (83.4 kg)  01/14/17 186 lb 3.2 oz (84.5 kg)  12/02/16 182 lb 4 oz (82.7 kg)    Physical Exam  Constitutional: No distress.  HENT:  Mouth/Throat:    Cardiovascular: Normal rate, regular rhythm and normal heart sounds.   Pulmonary/Chest: Effort normal and breath sounds normal.  Skin: He is not diaphoretic.     Assessment/Plan: Please see individual problem list.  Lesion of soft palate Patient with new lesion on soft palate. Could be benign though given his history of head and neck cancer we will have it evaluated by ENT. If he does not hear anything in the next week or so he is to let us know. Referral has been placed.   Orders Placed This Encounter  Procedures  . Ambulatory referral to ENT    Referral Priority:   Routine    Referral Type:   Consultation    Referral Reason:   Specialty Services Required    Requested Specialty:   Otolaryngology    Number of Visits Requested:   [place], MD [place]

## 2017-02-11 NOTE — Patient Instructions (Signed)
Nice to see you. We'll get you in to see ENT to evaluate the lesion on your soft palate. If you develop any trouble swallowing or the lesion enlarges please seek medical attention.

## 2017-02-11 NOTE — Assessment & Plan Note (Signed)
Patient with new lesion on soft palate. Could be benign though given his history of head and neck cancer we will have it evaluated by ENT. If he does not hear anything in the next week or so he is to let us know. Referral has been placed.

## 2017-02-18 ENCOUNTER — Telehealth: Payer: Self-pay | Admitting: Family Medicine

## 2017-02-18 NOTE — Telephone Encounter (Signed)
lvm for patient to rtc.  Not able to find an ENT within the [facility] that accepts the [facility] financial [facility]. Per [name] we may need to start with his oncologist having a look at the area so it would be covered under his financial assistants. Or we could proceed with ENT eval at [facility] and have him seek financial assistants there as well. Need to find out how the area is doing as well.

## 2017-03-07 ENCOUNTER — Inpatient Hospital Stay: Payer: Self-pay | Attending: Internal Medicine | Admitting: Internal Medicine

## 2017-03-07 ENCOUNTER — Other Ambulatory Visit: Payer: Self-pay

## 2017-03-07 ENCOUNTER — Inpatient Hospital Stay: Payer: Self-pay

## 2017-03-07 VITALS — BP 107/60 | HR 59 | Temp 97.7°F | Resp 16 | Wt 184.0 lb

## 2017-03-07 DIAGNOSIS — R531 Weakness: Secondary | ICD-10-CM | POA: Insufficient documentation

## 2017-03-07 DIAGNOSIS — Z9221 Personal history of antineoplastic chemotherapy: Secondary | ICD-10-CM | POA: Insufficient documentation

## 2017-03-07 DIAGNOSIS — Z87891 Personal history of nicotine dependence: Secondary | ICD-10-CM | POA: Insufficient documentation

## 2017-03-07 DIAGNOSIS — Z79899 Other long term (current) drug therapy: Secondary | ICD-10-CM | POA: Insufficient documentation

## 2017-03-07 DIAGNOSIS — C099 Malignant neoplasm of tonsil, unspecified: Secondary | ICD-10-CM | POA: Insufficient documentation

## 2017-03-07 DIAGNOSIS — R682 Dry mouth, unspecified: Secondary | ICD-10-CM | POA: Insufficient documentation

## 2017-03-07 DIAGNOSIS — C760 Malignant neoplasm of head, face and neck: Secondary | ICD-10-CM

## 2017-03-07 DIAGNOSIS — Z923 Personal history of irradiation: Secondary | ICD-10-CM | POA: Insufficient documentation

## 2017-03-07 DIAGNOSIS — F329 Major depressive disorder, single episode, unspecified: Secondary | ICD-10-CM | POA: Insufficient documentation

## 2017-03-07 LAB — COMPREHENSIVE METABOLIC PANEL
ALK PHOS: 44 U/L (ref 38–126)
ALT: 13 U/L — ABNORMAL LOW (ref 17–63)
ANION GAP: 5 (ref 5–15)
AST: 20 U/L (ref 15–41)
Albumin: 4.1 g/dL (ref 3.5–5.0)
BILIRUBIN TOTAL: 0.4 mg/dL (ref 0.3–1.2)
BUN: 14 mg/dL (ref 6–20)
CALCIUM: 9.5 mg/dL (ref 8.9–10.3)
CO2: 27 mmol/L (ref 22–32)
CREATININE: 0.9 mg/dL (ref 0.61–1.24)
Chloride: 106 mmol/L (ref 101–111)
Glucose, Bld: 90 mg/dL (ref 65–99)
Potassium: 3.7 mmol/L (ref 3.5–5.1)
Sodium: 138 mmol/L (ref 135–145)
TOTAL PROTEIN: 6.7 g/dL (ref 6.5–8.1)

## 2017-03-07 LAB — CBC WITH DIFFERENTIAL/PLATELET
Basophils Absolute: 0 10*3/uL (ref 0–0.1)
Basophils Relative: 1 %
Eosinophils Absolute: 0.5 10*3/uL (ref 0–0.7)
Eosinophils Relative: 10 %
HEMATOCRIT: 35.9 % — AB (ref 40.0–52.0)
Hemoglobin: 12.3 g/dL — ABNORMAL LOW (ref 13.0–18.0)
LYMPHS ABS: 0.8 10*3/uL — AB (ref 1.0–3.6)
Lymphocytes Relative: 17 %
MCH: 32.2 pg (ref 26.0–34.0)
MCHC: 34.2 g/dL (ref 32.0–36.0)
MCV: 94.2 fL (ref 80.0–100.0)
MONO ABS: 0.5 10*3/uL (ref 0.2–1.0)
MONOS PCT: 10 %
NEUTROS ABS: 3.1 10*3/uL (ref 1.4–6.5)
Neutrophils Relative %: 62 %
Platelets: 224 10*3/uL (ref 150–440)
RBC: 3.81 MIL/uL — ABNORMAL LOW (ref 4.40–5.90)
RDW: 13.2 % (ref 11.5–14.5)
WBC: 5 10*3/uL (ref 3.8–10.6)

## 2017-03-07 LAB — MAGNESIUM: Magnesium: 1.9 mg/dL (ref 1.7–2.4)

## 2017-03-07 NOTE — Progress Notes (Signed)
[place] NOTE  Patient Care Team: [name], MD as PCP - General (Family Medicine)    Oncology History   # AUG 2017-STAGE III [T1N1]SQUAMOUS CELL CA [s/p FNA; Dr.Vaught]  LEFT NECK/Submandibular LN- necrotic [~2-2.5cm]; Right neck [49mm]; SEP 22nd PET- left neck uptake; ? 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 Trauma.   # Low testosterone [on testosterone shots]     Squamous cell carcinoma of head and neck ([place])   05/18/2016 Initial Diagnosis    Squamous cell carcinoma of head and neck (HCC)       HISTORY OF PRESENTING ILLNESS:  Philip Cordova 51 y.o.  male with newly squamous cell carcinoma S/P positive currently s/p definitive chemoradiation- Finished treatment in late January 2018 is here for follow-up.   Patient notes to have improvement of his left upper extremity weakness/is undergoing physical therapy.Marland KitchenHe still complains of poor taste/dry mouth. He has been drinking fluids. No weight loss. In the interim he has been evaluated by ENT NED.   ROS: A complete 10 point review of system is done which is negative except mentioned above in history of present illness  MEDICAL HISTORY:  Past Medical History:  Diagnosis Date  . Asthma   . 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 Cancer ([place])   . Chickenpox     SURGICAL HISTORY: Past Surgical History:  Procedure Laterality Date  . APPENDECTOMY  2008  . DIAGNOSTIC LARYNGOSCOPY  06/17/2016   Procedure: DIAGNOSTIC LARYNGOSCOPY WITH BIOPSIES;  Surgeon: Carloyn Manner, MD;  Location: [facility] ORS;  Service: ENT;;  . Moles removed  2005  . RHINOPLASTY  1990  . 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   Lives in [place].   Social History   Social History  . Marital status: Single    Spouse name: N/A  . Number of children: N/A  . Years of education: N/A   Occupational History  . Not on file.   Social History Main Topics  . Smoking status: Former Smoker    Years: 1.00    Types: Cigarettes  . Smokeless tobacco: Never Used     Comment: "YEARS AGO"  . Alcohol use No     Comment: Occasionally  . Drug use: No  . Sexual activity: Not on file   Other Topics Concern  . Not on file   Social History Narrative  . No narrative on file    FAMILY HISTORY:  Family History  Problem Relation Age of Onset  . Cancer Father        Skin  . Hyperlipidemia Father   . Hypertension Father        Possible  . Diabetes Paternal Uncle     ALLERGIES:  is allergic to milk-related compounds.  MEDICATIONS:  Current Outpatient Prescriptions  Medication Sig Dispense Refill  . acetaminophen (TYLENOL) 500 MG tablet Take 1,000 mg by mouth 2 (two) times daily.    . Multiple Vitamins-Minerals (MULTIVITAMIN WITH MINERALS) tablet Take by mouth.    . valACYclovir (VALTREX) 1000 MG tablet Take 1 tablet (1,000 mg total) by mouth 2 (two) times daily. 20 tablet 0  . zolpidem (AMBIEN) 5 MG  tablet Take 1 tablet (5 mg total) by mouth at bedtime as needed. for sleep 10 tablet 0   No current facility-administered medications for this visit.       Marland Kitchen  PHYSICAL EXAMINATION: ECOG PERFORMANCE STATUS: 0 - Asymptomatic  Vitals:   03/07/17 1434  BP: 107/60  Pulse: (!) 59  Resp: 16  Temp: 97.7 F (36.5 C)   Filed Weights   03/07/17 1434  Weight: 184 lb (83.5 kg)    GENERAL: Well-nourished well-developed; Alert, no distress and comfortable. He is alone. EYES: no pallor or icterus OROPHARYNX: no thrush or ulceration; good dentition  NECK: supple, no masses felt LYMPH:  no palpable lymphadenopathy in the axillary or inguinal regions; No cervical LN. LUNGS: clear to auscultation and  No wheeze or  crackles HEART/CVS: regular rate & rhythm and no murmurs; No lower extremity edema ABDOMEN: abdomen soft, non-tender and normal bowel sounds Musculoskeletal:no cyanosis of digits and no clubbing  PSYCH: alert & oriented x 3 with fluent speech NEURO: no focal motor/sensory deficits; slight improvement of the left upper extremity distant. SKIN:  no rashes or significant lesions  LABORATORY DATA:  I have reviewed the data as listed Lab Results  Component Value Date   WBC 5.0 03/07/2017   HGB 12.3 (L) 03/07/2017   HCT 35.9 (L) 03/07/2017   MCV 94.2 03/07/2017   PLT 224 03/07/2017    Recent Labs  09/15/16 1238  11/01/16 1646 12/02/16 0930 03/07/17 1410  NA 131*  < > 137 137 138  K 4.1  < > 3.9 3.5 3.7  CL 94*  < > 101 102 106  CO2 28  < > 29 30 27   GLUCOSE 127*  < > 90 109* 90  BUN 32*  < > 21* 17 14  CREATININE 0.98  < > 0.75 0.88 0.90  CALCIUM 9.2  < > 9.2 9.3 9.5  GFRNONAA >60  < > >60 >60 >60  GFRAA >60  < > >60 >60 >60  PROT 6.4*  --   --  6.7 6.7  ALBUMIN 3.7  --   --  3.9 4.1  AST 24  --   --  19 20  ALT 41  --   --  17 13*  ALKPHOS 42  --   --  37* 44  BILITOT 0.6  --   --  0.7 0.4  < > = values in this interval not displayed.  RADIOGRAPHIC STUDIES: I have personally reviewed the radiological images as listed and agreed with the findings in the report. No results found.  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 S/p cycle #3.  Last RT- jan 22nd.   Clinical improvement noted. April 3rd- PET scan- NED; improved 8 mm LN; no uptake.   # left upper extremity  Weakness/ improved.  # dry mouth- improved; continued frequent sips of water.   # follow up in 6 months/ labs/TSH.    Cammie Sickle, MD 03/07/2017 6:32 PM

## 2017-03-07 NOTE — Assessment & Plan Note (Addendum)
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 S/p cycle #3.  Last RT- jan 22nd.   Clinical improvement noted. April 3rd- PET scan- NED; improved 8 mm LN; no uptake.   # left upper extremity  Weakness/ improved.  # dry mouth- improved; continued frequent sips of water.   # follow up in 6 months/ labs/TSH.

## 2017-03-07 NOTE — Progress Notes (Signed)
Patient here today for follow up.  Patient states that he has a recent lesion removed from the soft pallet of his mouth, bx came back normal

## 2017-03-22 IMAGING — DX DG WRIST COMPLETE 3+V*L*
4 series · 4 of 4 positions shown · non-contrast
Comparison: None.

CLINICAL DATA: Left wrist pain/injury

EXAM:
LEFT WRIST - COMPLETE 3+ VIEW

[wrist ap (1 of 2)]
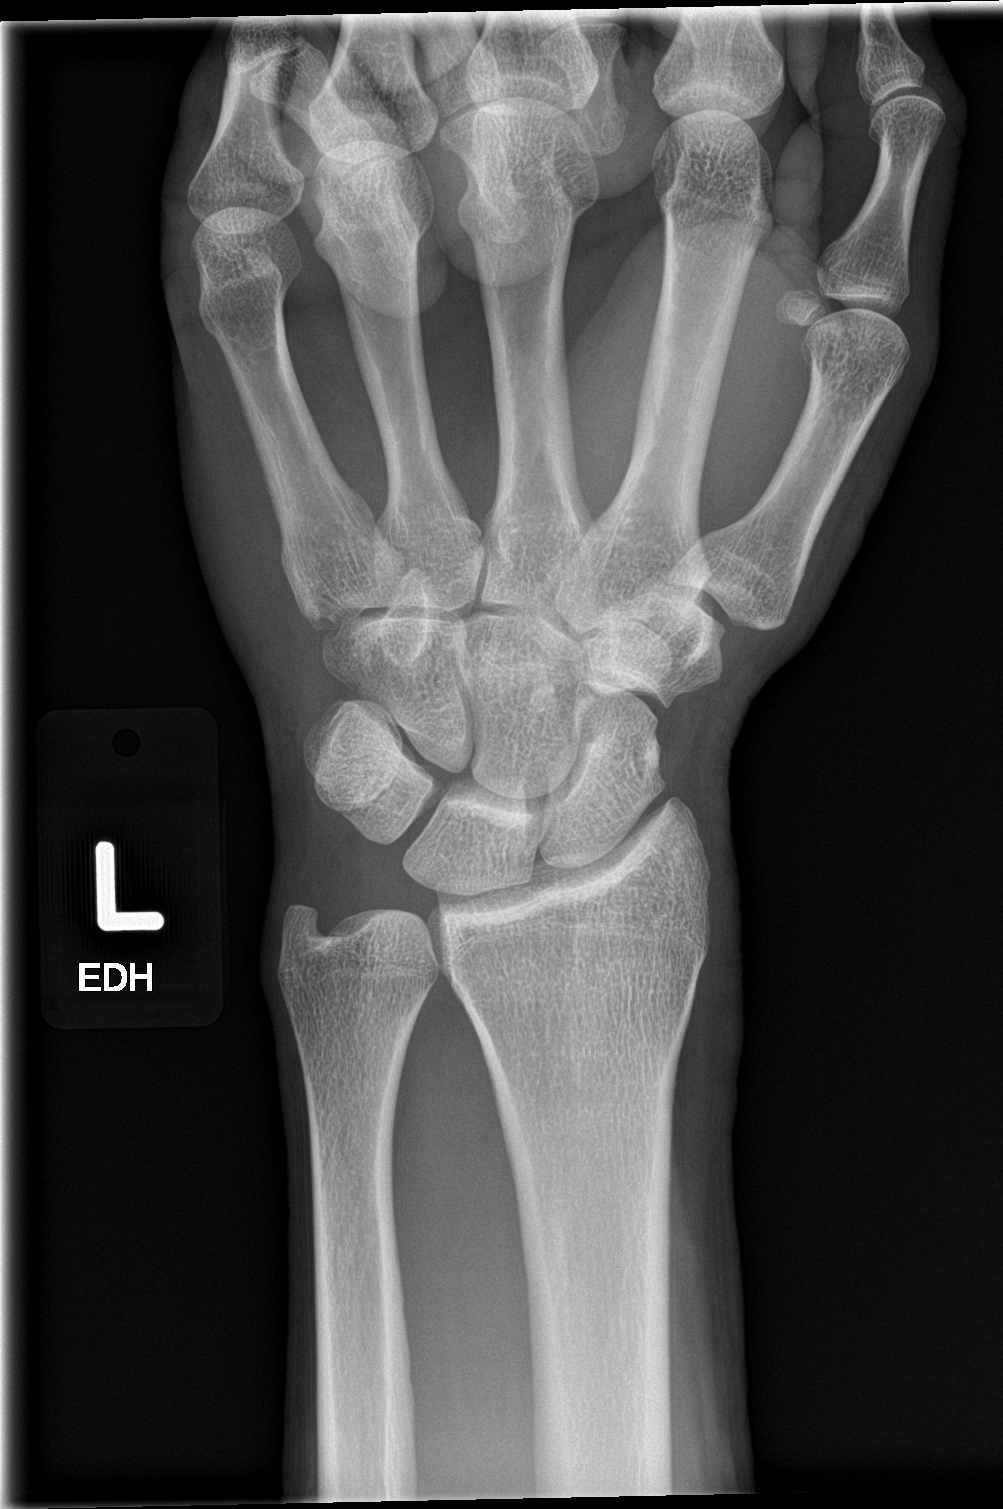

[wrist obl]
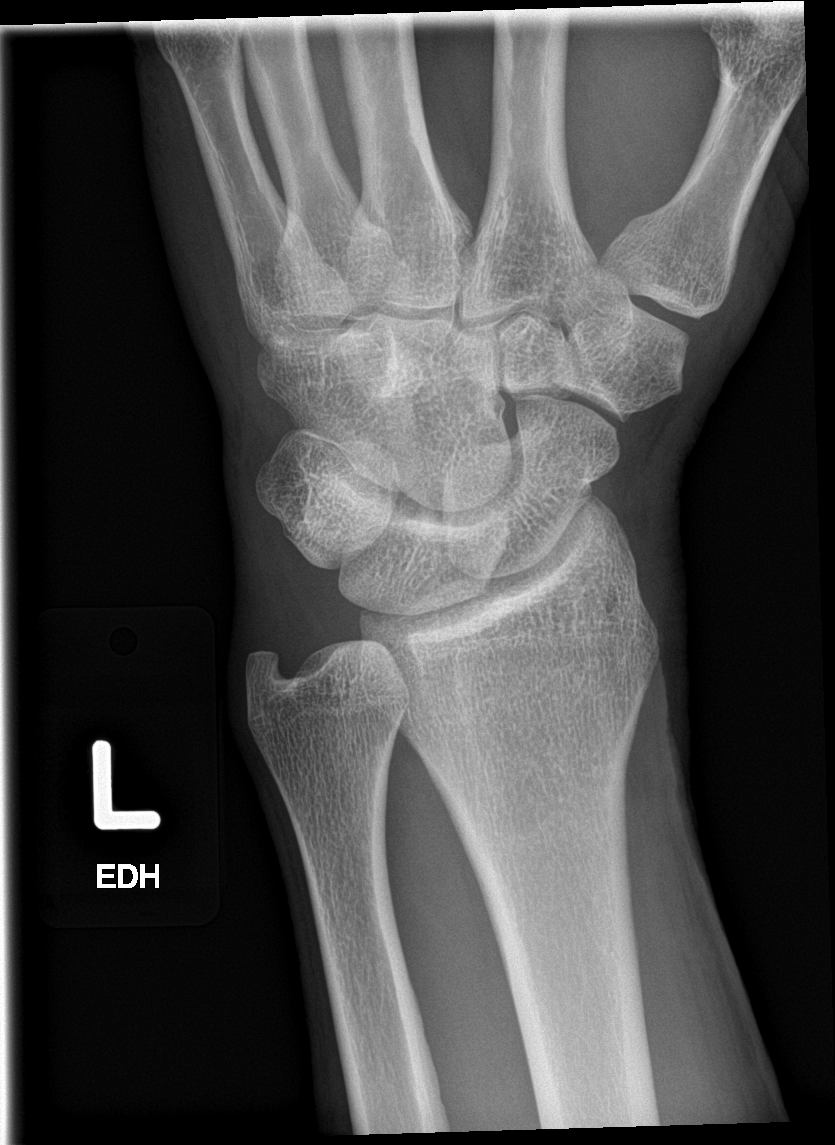

[wrist lat]
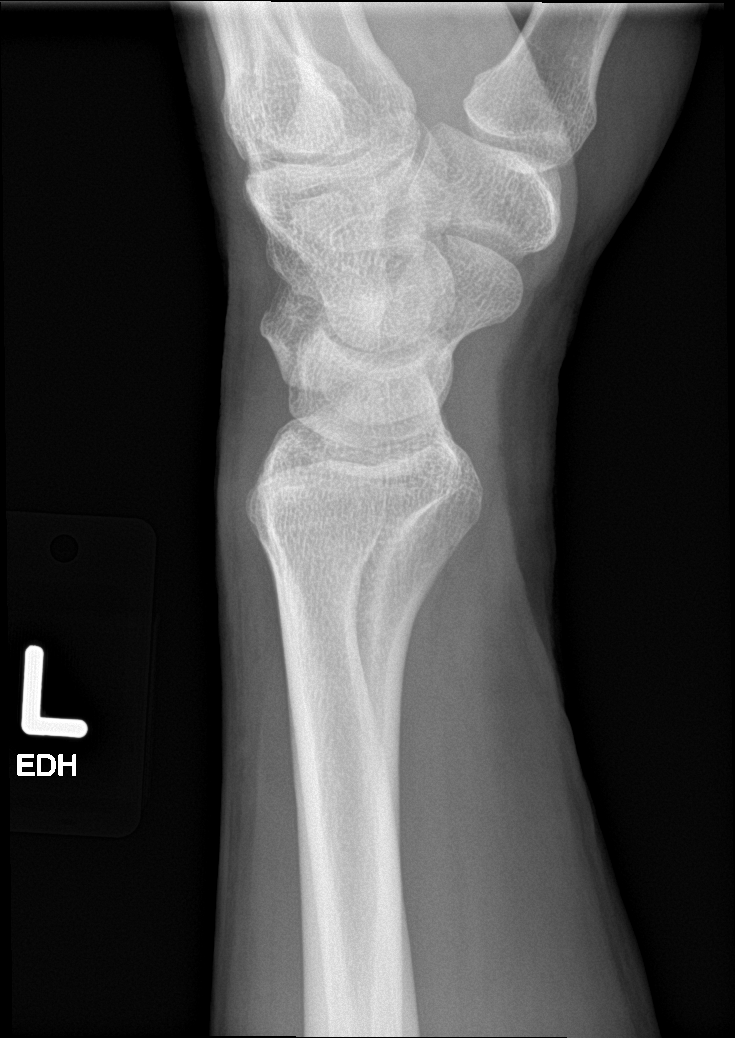

[wrist ap (2 of 2)]
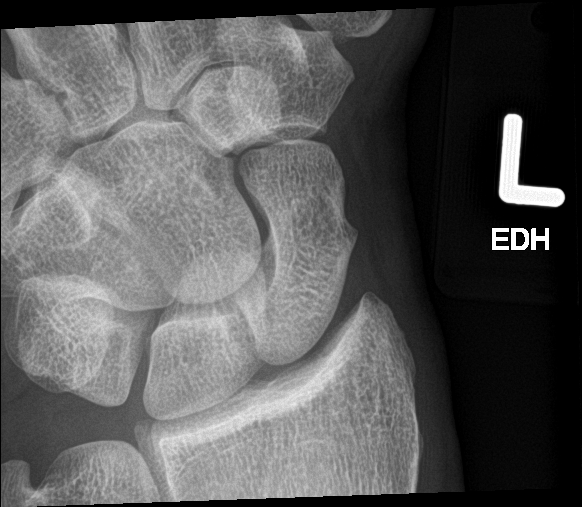

[4 of 4 positions shown; findings below may reference images not displayed]

FINDINGS: No fracture or dislocation is seen.

The joint spaces are preserved.

The visualized soft tissues are unremarkable.
IMPRESSION: Negative.

## 2017-03-23 ENCOUNTER — Other Ambulatory Visit: Payer: Self-pay | Admitting: Oncology

## 2017-03-23 NOTE — Telephone Encounter (Signed)
md declines refill. States pt no longer needs this med as pt is not on active chemo

## 2017-04-18 ENCOUNTER — Ambulatory Visit: Payer: Self-pay | Admitting: Family Medicine

## 2017-04-18 IMAGING — CR DG CHEST 2V
2 series · 2 of 2 positions shown · non-contrast
Comparison: CT 11/01/2016

CLINICAL DATA: Tongue and throat cancer pleuritic chest pain

EXAM:
CHEST  2 VIEW

[chest pa]
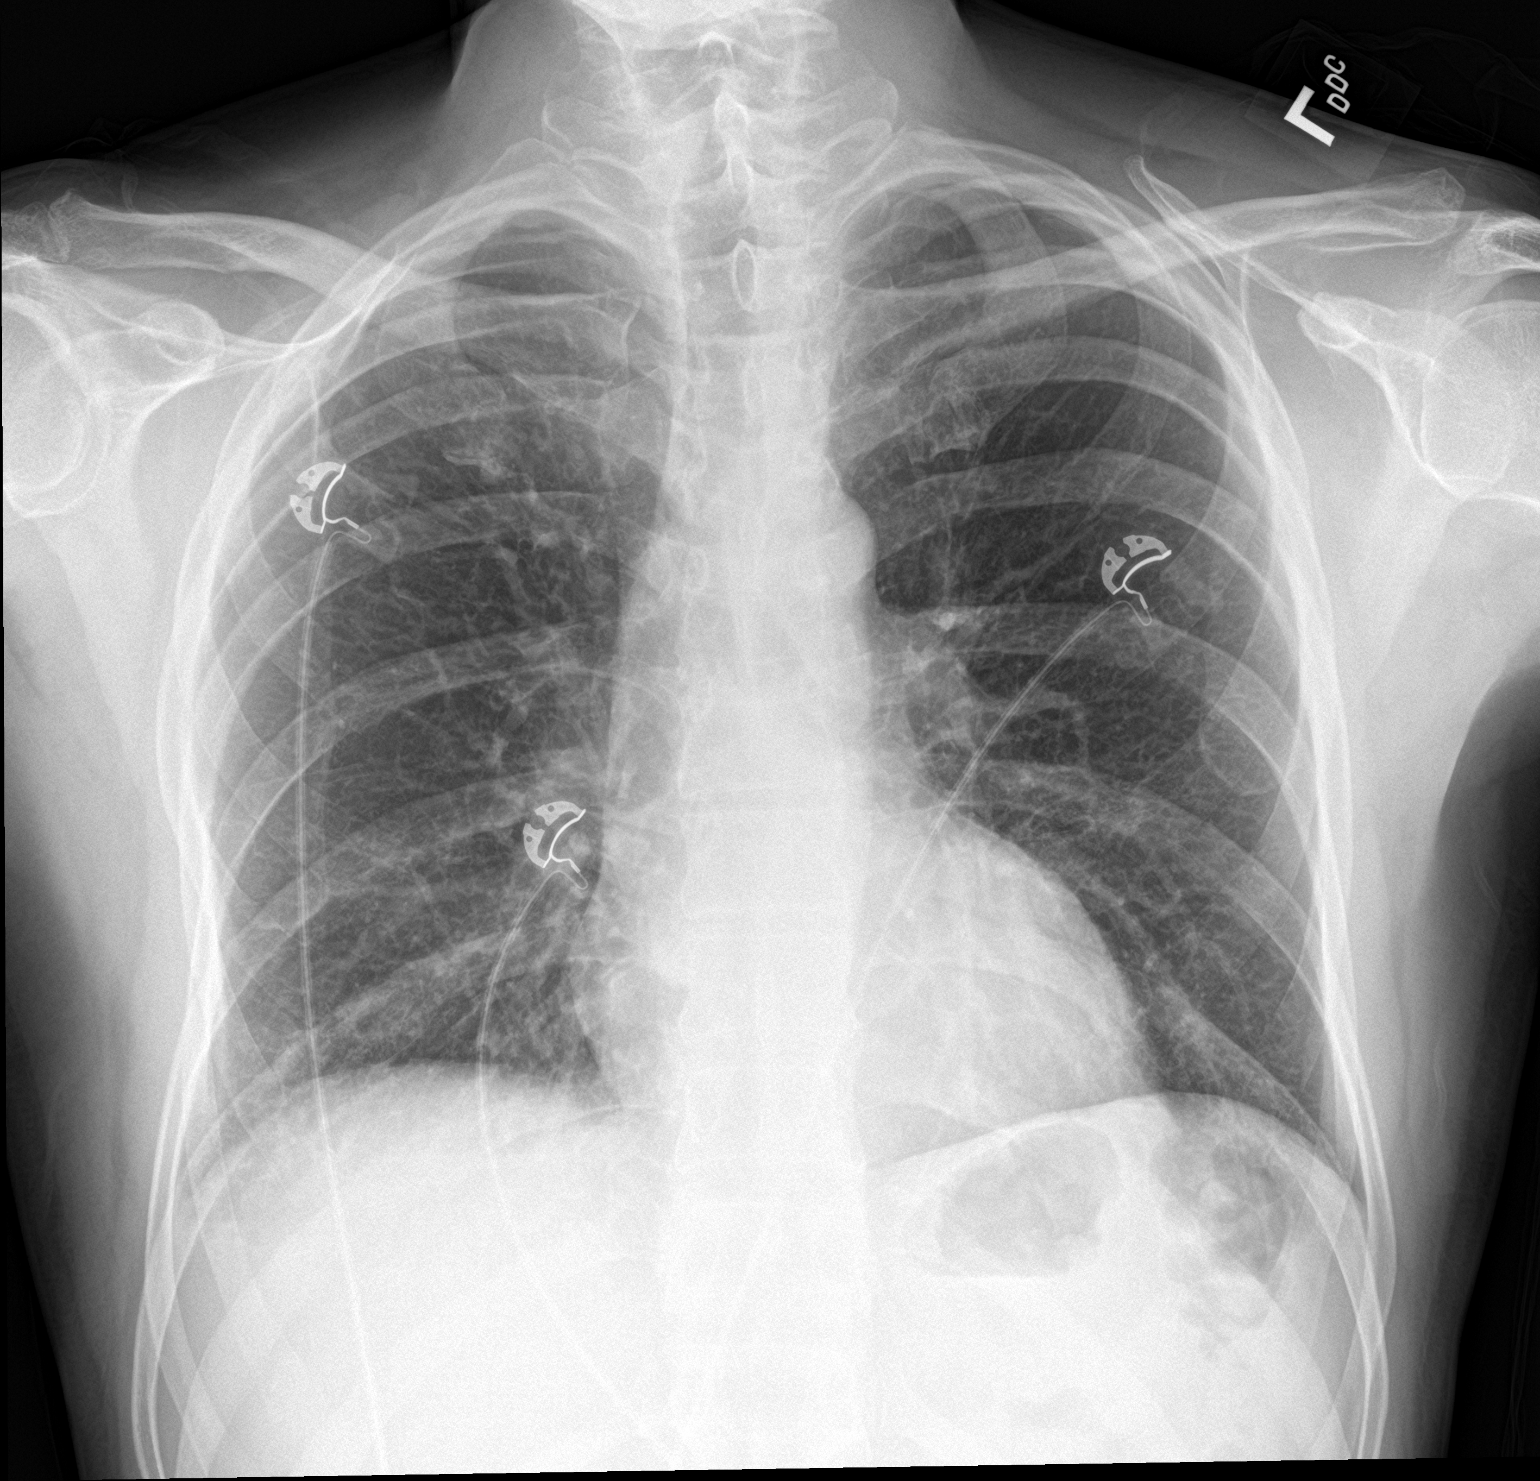

[chest lat]
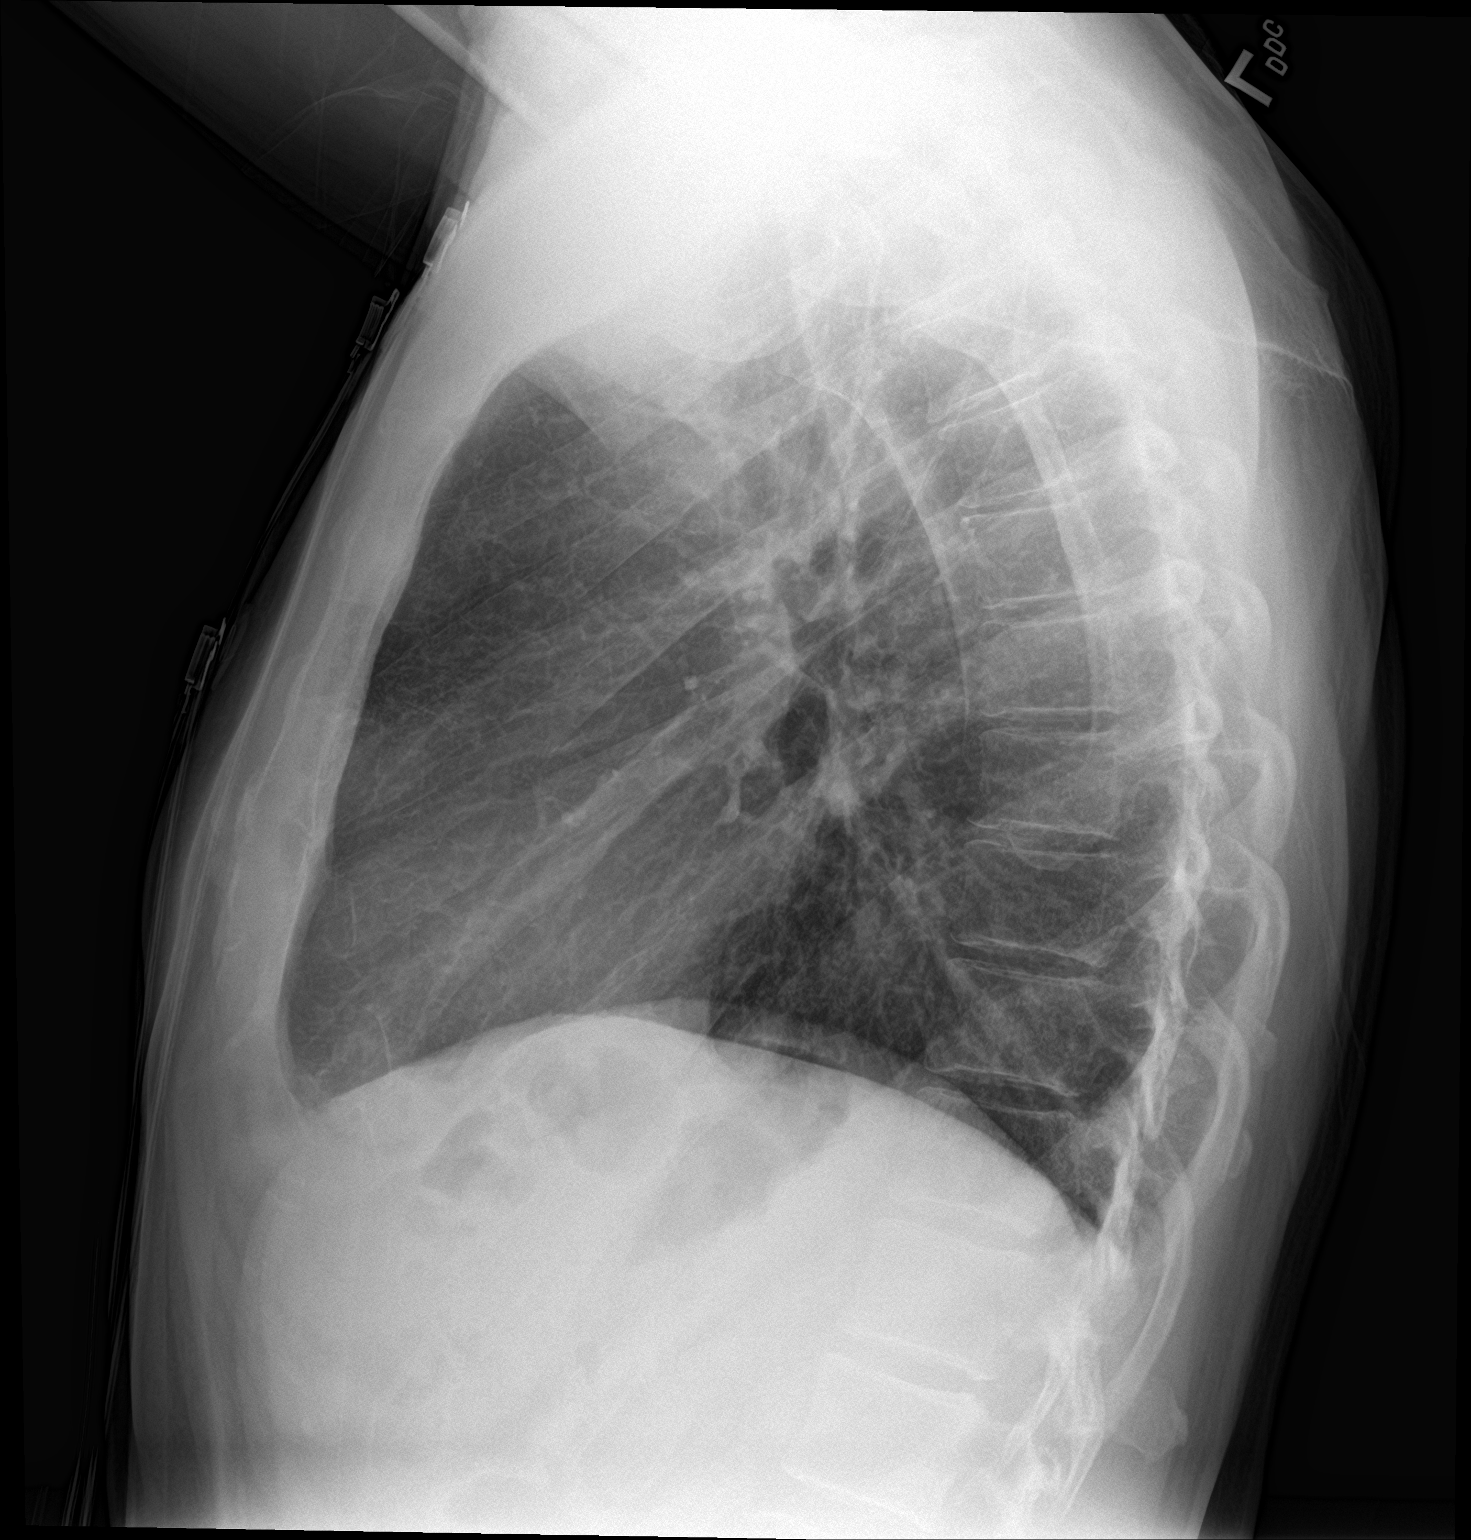

[2 of 2 positions shown; findings below may reference images not displayed]

FINDINGS: No pleural effusion. Mild opacity right lateral lung base,
corresponds to the CT abnormality. Normal heart size. No
pneumothorax.
IMPRESSION: Mild right lateral basilar opacity may reflect atelectasis or an
infiltrate. Otherwise negative examination.

## 2017-06-10 ENCOUNTER — Ambulatory Visit (INDEPENDENT_AMBULATORY_CARE_PROVIDER_SITE_OTHER): Payer: Self-pay | Admitting: Family Medicine

## 2017-06-10 ENCOUNTER — Encounter: Payer: Self-pay | Admitting: Family Medicine

## 2017-06-10 DIAGNOSIS — H60502 Unspecified acute noninfective otitis externa, left ear: Secondary | ICD-10-CM

## 2017-06-10 DIAGNOSIS — K1379 Other lesions of oral mucosa: Secondary | ICD-10-CM

## 2017-06-10 DIAGNOSIS — H6090 Unspecified otitis externa, unspecified ear: Secondary | ICD-10-CM | POA: Insufficient documentation

## 2017-06-10 MED ORDER — NEOMYCIN-POLYMYXIN-HC 3.5-10000-1 OT SOLN: 4.0000 [drp] | Freq: Three times a day (TID) | OTIC | 0 refills | Status: DC

## 2017-06-10 NOTE — Assessment & Plan Note (Signed)
Patient with otitis externa left greater than right. We'll treat with Cortisporin otic given cost concerns with Ciprodex. This was sent to his pharmacy. If too expensive he was provided with a paper copy and a discount card for [facility]. If not improving he'll let us know.

## 2017-06-10 NOTE — Progress Notes (Signed)
  Tommi Rumps, MD Phone: [phone]  Philip Cordova is a 51 y.o. male who presents today for same-day visit.  Patient notes left ear discomfort for the last 10 days. Notes right ear started to bother him a little bit. He's been doing hydrogen peroxide rinses in his ears. Mild congestion though this is improved. No fevers. No drainage. He has a history of otitis externa previously treated with Ciprodex. He reports that he was evaluated by ENT and had to polyps taken off of the soft palate. These came back as HPV. Had to go back in and remove more as one of them came back. He is following with ENT for this.   ROS see history of present illness  Objective  Physical Exam Vitals:   06/10/17 1322  BP: 106/78  Pulse: 78  Temp: 98 F (36.7 C)  SpO2: 98%    BP Readings from Last 3 Encounters:  06/10/17 106/78  03/07/17 107/60  02/11/17 100/68   Wt Readings from Last 3 Encounters:  06/10/17 178 lb 12.8 oz (81.1 kg)  03/07/17 184 lb (83.5 kg)  02/11/17 183 lb 12.8 oz (83.4 kg)    Physical Exam  Constitutional: No distress.  HENT:  Head: Normocephalic and atraumatic.  Mouth/Throat: Oropharynx is clear and moist. No oropharyngeal exudate.  Normal TMs bilaterally, left ear canal with flaking and slight edema, right ear canal with slight erythema  Cardiovascular: Normal rate, regular rhythm and normal heart sounds.   Pulmonary/Chest: Effort normal and breath sounds normal.  Skin: He is not diaphoretic.     Assessment/Plan: Please see individual problem list.  Otitis externa Patient with otitis externa left greater than right. We'll treat with Cortisporin otic given cost concerns with Ciprodex. This was sent to his pharmacy. If too expensive he was provided with a paper copy and a discount card for [facility]. If not improving he'll let us know.  Lesion of soft palate Has been evaluated by ENT. No evidence of recurrence. Continue to monitor.   No orders of the defined types  were placed in this encounter.   Meds ordered this encounter  Medications  . Omega-3 Fatty Acids (FISH OIL) 1000 MG CAPS    Sig: Take by mouth.  . DISCONTD: neomycin-polymyxin-hydrocortisone (CORTISPORIN) OTIC solution    Sig: Place 4 drops into both ears 3 (three) times daily.    Dispense:  10 mL    Refill:  0  . neomycin-polymyxin-hydrocortisone (CORTISPORIN) OTIC solution    Sig: Place 4 drops into both ears 3 (three) times daily.    Dispense:  10 mL    Refill:  0    Tommi Rumps, MD [place]

## 2017-06-10 NOTE — Assessment & Plan Note (Signed)
Has been evaluated by ENT. No evidence of recurrence. Continue to monitor.

## 2017-06-10 NOTE — Patient Instructions (Signed)
Nice to see you. You likely have otitis externa. Please do the Cortisporin otic eardrops. If you develop worsening symptoms please be evaluated.

## 2017-06-22 ENCOUNTER — Telehealth: Payer: Self-pay | Admitting: Family Medicine

## 2017-06-22 NOTE — Telephone Encounter (Signed)
Please advise 

## 2017-06-22 NOTE — Telephone Encounter (Signed)
Pt called and wanted to know if Dr. [name] could write him a note to be out of work for 3-6  months until his wrist fully heals. He forgot to ask when he was in. Please advise, thank you!  Call pt @ [phone]

## 2017-06-22 NOTE — Telephone Encounter (Signed)
Patient states he needs a work note for food stamps saying that his hand is not fully healed at this time, patient is going back to work in about 8 weeks but would like food stamps until then.

## 2017-06-22 NOTE — Telephone Encounter (Signed)
Please find out what this entails. Is this for disability? Is this just a work note? Please confirm what his job was. Thanks.

## 2017-06-25 NOTE — Telephone Encounter (Addendum)
Please find out exactly what the patient's job description was previously. I'll need to know this for the letter. I can state that it has been difficult for him to work relating to his hand though I need to know what his job function was previously. Please also find out if he has checked to see if it's just a letter that he needs. There may be forms that need to be filled out by [place]. Thanks.

## 2017-06-27 NOTE — Telephone Encounter (Signed)
Left message to return call 

## 2017-06-27 NOTE — Telephone Encounter (Signed)
Completed.

## 2017-06-27 NOTE — Telephone Encounter (Signed)
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

## 2017-06-28 NOTE — Telephone Encounter (Signed)
Left message to notify letter is at front desk

## 2017-09-05 ENCOUNTER — Inpatient Hospital Stay: Payer: Self-pay

## 2017-09-05 ENCOUNTER — Inpatient Hospital Stay: Payer: Self-pay | Attending: Internal Medicine | Admitting: Internal Medicine

## 2017-09-05 ENCOUNTER — Encounter: Payer: Self-pay | Admitting: Internal Medicine

## 2017-09-05 VITALS — BP 103/69 | HR 56 | Temp 97.8°F | Resp 18 | Ht 73.0 in

## 2017-09-05 DIAGNOSIS — R682 Dry mouth, unspecified: Secondary | ICD-10-CM

## 2017-09-05 DIAGNOSIS — Z79899 Other long term (current) drug therapy: Secondary | ICD-10-CM

## 2017-09-05 DIAGNOSIS — F329 Major depressive disorder, single episode, unspecified: Secondary | ICD-10-CM | POA: Insufficient documentation

## 2017-09-05 DIAGNOSIS — C760 Malignant neoplasm of head, face and neck: Secondary | ICD-10-CM

## 2017-09-05 DIAGNOSIS — C099 Malignant neoplasm of tonsil, unspecified: Secondary | ICD-10-CM

## 2017-09-05 DIAGNOSIS — R531 Weakness: Secondary | ICD-10-CM

## 2017-09-05 DIAGNOSIS — Z87891 Personal history of nicotine dependence: Secondary | ICD-10-CM | POA: Insufficient documentation

## 2017-09-05 DIAGNOSIS — Z9221 Personal history of antineoplastic chemotherapy: Secondary | ICD-10-CM

## 2017-09-05 DIAGNOSIS — Z923 Personal history of irradiation: Secondary | ICD-10-CM

## 2017-09-05 LAB — CBC WITH DIFFERENTIAL/PLATELET
Basophils Absolute: 0.1 10*3/uL (ref 0–0.1)
Basophils Relative: 1 %
Eosinophils Absolute: 0.4 10*3/uL (ref 0–0.7)
Eosinophils Relative: 5 %
HEMATOCRIT: 40.4 % (ref 40.0–52.0)
Hemoglobin: 13.5 g/dL (ref 13.0–18.0)
LYMPHS ABS: 0.9 10*3/uL — AB (ref 1.0–3.6)
LYMPHS PCT: 13 %
MCH: 31.7 pg (ref 26.0–34.0)
MCHC: 33.5 g/dL (ref 32.0–36.0)
MCV: 94.5 fL (ref 80.0–100.0)
MONO ABS: 0.7 10*3/uL (ref 0.2–1.0)
Monocytes Relative: 10 %
NEUTROS ABS: 5.2 10*3/uL (ref 1.4–6.5)
Neutrophils Relative %: 71 %
Platelets: 271 10*3/uL (ref 150–440)
RBC: 4.27 MIL/uL — ABNORMAL LOW (ref 4.40–5.90)
RDW: 13.2 % (ref 11.5–14.5)
WBC: 7.4 10*3/uL (ref 3.8–10.6)

## 2017-09-05 LAB — COMPREHENSIVE METABOLIC PANEL
ALT: 17 U/L (ref 17–63)
AST: 23 U/L (ref 15–41)
Albumin: 4.3 g/dL (ref 3.5–5.0)
Alkaline Phosphatase: 56 U/L (ref 38–126)
Anion gap: 9 (ref 5–15)
BUN: 21 mg/dL — AB (ref 6–20)
CHLORIDE: 99 mmol/L — AB (ref 101–111)
CO2: 28 mmol/L (ref 22–32)
Calcium: 9.2 mg/dL (ref 8.9–10.3)
Creatinine, Ser: 0.93 mg/dL (ref 0.61–1.24)
GFR calc Af Amer: >60 mL/min (ref >60)
GFR calc non Af Amer: >60 mL/min (ref >60)
Glucose, Bld: 117 mg/dL — ABNORMAL HIGH (ref 65–99)
Potassium: 4.4 mmol/L (ref 3.5–5.1)
SODIUM: 136 mmol/L (ref 135–145)
TOTAL PROTEIN: 7.2 g/dL (ref 6.5–8.1)
Total Bilirubin: 0.5 mg/dL (ref 0.3–1.2)

## 2017-09-05 LAB — TSH: TSH: 1.837 u[IU]/mL (ref 0.350–4.500)

## 2017-09-05 NOTE — Progress Notes (Signed)
[place] NOTE  Patient Care Team: [name], MD as PCP - General (Family Medicine)    Oncology History   # AUG 2017-STAGE III [T1N1]SQUAMOUS CELL CA [s/p FNA; Dr.Vaught]  LEFT NECK/Submandibular LN- necrotic [~2-2.5cm]; Right neck [26mm]; SEP 22nd PET- left neck uptake; ? 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 Trauma.   # Low testosterone [on testosterone shots]     Squamous cell carcinoma of head and neck ([place])   05/18/2016 Initial Diagnosis    Squamous cell carcinoma of head and neck (HCC)        HISTORY OF PRESENTING ILLNESS:  Philip Cordova 52 y.o.  male with newly squamous cell carcinoma S/P positive currently s/p definitive chemoradiation- Finished treatment in late January 2018 is here for follow-up.   His weakness in the left arm is improving.  His appetite is good.  He is eating better.  No weight loss. In the interim he has been evaluated by ENT NED.   ROS: A complete 10 point review of system is done which is negative except mentioned above in history of present illness  MEDICAL HISTORY:  Past Medical History:  Diagnosis Date  . Asthma   . 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 Cancer ([place])   . Chickenpox     SURGICAL HISTORY: Past Surgical History:  Procedure Laterality Date  . APPENDECTOMY  2008  . DIAGNOSTIC LARYNGOSCOPY  06/17/2016   Procedure: DIAGNOSTIC LARYNGOSCOPY WITH BIOPSIES;  Surgeon: Carloyn Manner, MD;  Location: [facility] ORS;  Service: ENT;;  . Moles removed  2005  . RHINOPLASTY  1990  . 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   Social History   Socioeconomic History  . Marital status:  Single    Spouse name: Not on file  . Number of children: Not on file  . Years of education: Not on file  . Highest education level: Not on file  Social Needs  . Financial resource strain: Not on file  . Food insecurity - worry: Not on file  . Food insecurity - inability: Not on file  . Transportation needs - medical: Not on file  . Transportation needs - non-medical: Not on file  Occupational History  . Not on file  Tobacco Use  . Smoking status: Former Smoker    Years: 1.00    Types: Cigarettes  . Smokeless tobacco: Never Used  . Tobacco comment: "YEARS AGO"  Substance and Sexual Activity  . Alcohol use: No    Comment: Occasionally  . Drug use: No  . Sexual activity: Not on file  Other Topics Concern  . Not on file  Social History Narrative  . Not on file    FAMILY HISTORY:  Family History  Problem Relation Age of Onset  . Cancer Father        Skin  . Hyperlipidemia Father   . Hypertension Father        Possible  . Diabetes Paternal Uncle     ALLERGIES:  is allergic to milk-related compounds.  MEDICATIONS:  Current Outpatient Medications  Medication Sig Dispense Refill  . acetaminophen (TYLENOL) 500 MG tablet  Take 1,000 mg by mouth 2 (two) times daily.    . Multiple Vitamins-Minerals (MULTIVITAMIN WITH MINERALS) tablet Take by mouth.    . neomycin-polymyxin-hydrocortisone (CORTISPORIN) OTIC solution Place 4 drops into both ears 3 (three) times daily. 10 mL 0  . Omega-3 Fatty Acids (FISH OIL) 1000 MG CAPS Take by mouth.    . valACYclovir (VALTREX) 1000 MG tablet Take 1 tablet (1,000 mg total) by mouth 2 (two) times daily. 20 tablet 0  . zolpidem (AMBIEN) 5 MG tablet Take 1 tablet (5 mg total) by mouth at bedtime as needed. for sleep 10 tablet 0   No current facility-administered medications for this visit.       Marland Kitchen  PHYSICAL EXAMINATION: ECOG PERFORMANCE STATUS: 0 - Asymptomatic  There were no vitals filed for this visit. There were no vitals filed for  this visit.  GENERAL: Well-nourished well-developed; Alert, no distress and comfortable. He is alone. EYES: no pallor or icterus OROPHARYNX: no thrush or ulceration; good dentition  NECK: supple, no masses felt LYMPH:  no palpable lymphadenopathy in the axillary or inguinal regions; No cervical LN. LUNGS: clear to auscultation and  No wheeze or crackles HEART/CVS: regular rate & rhythm and no murmurs; No lower extremity edema ABDOMEN: abdomen soft, non-tender and normal bowel sounds Musculoskeletal:no cyanosis of digits and no clubbing  PSYCH: alert & oriented x 3 with fluent speech NEURO: no focal motor/sensory deficits; slight improvement of the left upper extremity distant. SKIN:  no rashes or significant lesions  LABORATORY DATA:  I have reviewed the data as listed Lab Results  Component Value Date   WBC 5.0 03/07/2017   HGB 12.3 (L) 03/07/2017   HCT 35.9 (L) 03/07/2017   MCV 94.2 03/07/2017   PLT 224 03/07/2017   Recent Labs    09/15/16 1238  11/01/16 1646 12/02/16 0930 03/07/17 1410  NA 131*   < > 137 137 138  K 4.1   < > 3.9 3.5 3.7  CL 94*   < > 101 102 106  CO2 28   < > 29 30 27   GLUCOSE 127*   < > 90 109* 90  BUN 32*   < > 21* 17 14  CREATININE 0.98   < > 0.75 0.88 0.90  CALCIUM 9.2   < > 9.2 9.3 9.5  GFRNONAA >60   < > >60 >60 >60  GFRAA >60   < > >60 >60 >60  PROT 6.4*  --   --  6.7 6.7  ALBUMIN 3.7  --   --  3.9 4.1  AST 24  --   --  19 20  ALT 41  --   --  17 13*  ALKPHOS 42  --   --  37* 44  BILITOT 0.6  --   --  0.7 0.4   < > = values in this interval not displayed.    RADIOGRAPHIC STUDIES: I have personally reviewed the radiological images as listed and agreed with the findings in the report. No results found.  ASSESSMENT & PLAN:   No problem-specific Assessment & Plan notes found for this encounter.   Cammie Sickle, MD 09/05/2017 10:04 AM

## 2017-09-05 NOTE — Assessment & Plan Note (Addendum)
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 S/p cycle #3.  Last RT- jan 22nd 2018.    Clinical improvement noted. April 3rd 2018- PET scan- NED; improved 8 mm LN; no uptake. Clinically NED.  Continue evaluation with ENT.  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   # follow up in 6 months/ labs/TSH.

## 2018-03-06 ENCOUNTER — Inpatient Hospital Stay: Payer: Self-pay | Admitting: Internal Medicine

## 2018-03-06 ENCOUNTER — Inpatient Hospital Stay: Payer: Self-pay

## 2018-03-27 ENCOUNTER — Ambulatory Visit: Payer: Self-pay | Admitting: Internal Medicine

## 2018-03-27 ENCOUNTER — Other Ambulatory Visit: Payer: Self-pay

## 2018-03-30 ENCOUNTER — Inpatient Hospital Stay: Payer: Self-pay

## 2018-03-30 ENCOUNTER — Inpatient Hospital Stay: Payer: Self-pay | Attending: Internal Medicine | Admitting: Internal Medicine

## 2018-03-30 VITALS — BP 120/72 | HR 68 | Temp 97.1°F | Resp 16 | Wt 174.0 lb

## 2018-03-30 DIAGNOSIS — Z224 Carrier of infections with a predominantly sexual mode of transmission: Secondary | ICD-10-CM | POA: Insufficient documentation

## 2018-03-30 DIAGNOSIS — C760 Malignant neoplasm of head, face and neck: Secondary | ICD-10-CM

## 2018-03-30 DIAGNOSIS — Z923 Personal history of irradiation: Secondary | ICD-10-CM | POA: Insufficient documentation

## 2018-03-30 DIAGNOSIS — Z9221 Personal history of antineoplastic chemotherapy: Secondary | ICD-10-CM | POA: Insufficient documentation

## 2018-03-30 DIAGNOSIS — R590 Localized enlarged lymph nodes: Secondary | ICD-10-CM | POA: Insufficient documentation

## 2018-03-30 DIAGNOSIS — Z87891 Personal history of nicotine dependence: Secondary | ICD-10-CM | POA: Insufficient documentation

## 2018-03-30 LAB — COMPREHENSIVE METABOLIC PANEL
ALT: 18 U/L (ref 0–44)
AST: 23 U/L (ref 15–41)
Albumin: 4.2 g/dL (ref 3.5–5.0)
Alkaline Phosphatase: 52 U/L (ref 38–126)
Anion gap: 9 (ref 5–15)
BUN: 20 mg/dL (ref 6–20)
CHLORIDE: 104 mmol/L (ref 98–111)
CO2: 25 mmol/L (ref 22–32)
Calcium: 9.1 mg/dL (ref 8.9–10.3)
Creatinine, Ser: 1.07 mg/dL (ref 0.61–1.24)
Glucose, Bld: 99 mg/dL (ref 70–99)
POTASSIUM: 3.8 mmol/L (ref 3.5–5.1)
SODIUM: 138 mmol/L (ref 135–145)
Total Bilirubin: 1 mg/dL (ref 0.3–1.2)
Total Protein: 7.1 g/dL (ref 6.5–8.1)

## 2018-03-30 LAB — CBC WITH DIFFERENTIAL/PLATELET
Basophils Absolute: 0.1 10*3/uL (ref 0–0.1)
Basophils Relative: 2 %
Eosinophils Absolute: 0.7 10*3/uL (ref 0–0.7)
Eosinophils Relative: 14 %
HEMATOCRIT: 39.6 % — AB (ref 40.0–52.0)
Hemoglobin: 13.6 g/dL (ref 13.0–18.0)
LYMPHS ABS: 0.8 10*3/uL — AB (ref 1.0–3.6)
LYMPHS PCT: 16 %
MCH: 32 pg (ref 26.0–34.0)
MCHC: 34.4 g/dL (ref 32.0–36.0)
MCV: 92.9 fL (ref 80.0–100.0)
Monocytes Absolute: 0.6 10*3/uL (ref 0.2–1.0)
Monocytes Relative: 11 %
NEUTROS ABS: 2.9 10*3/uL (ref 1.4–6.5)
NEUTROS PCT: 57 %
Platelets: 259 10*3/uL (ref 150–440)
RBC: 4.26 MIL/uL — AB (ref 4.40–5.90)
RDW: 13 % (ref 11.5–14.5)
WBC: 5.2 10*3/uL (ref 3.8–10.6)

## 2018-03-30 LAB — TSH: TSH: 3.462 u[IU]/mL (ref 0.350–4.500)

## 2018-03-30 NOTE — Assessment & Plan Note (Addendum)
#   Squamous cell carcinoma-  Left BOT/Tonsillar- T1N1- STAGE III HPV- positive/ p16 positive. Last RT- jan 22nd 2018.  Currently on surveillance.  Stable  #Question right neck lymph node-check CT of the neck ASAP.  #Left upper extremity weakness/ulnar nerve injury-status post trauma currently resolved.   # follow up based on above work up. CT ASAP [call- [phone]

## 2018-03-30 NOTE — Progress Notes (Signed)
[facility] OFFICE PROGRESS NOTE  Patient Care Team: [name], MD as PCP - General (Family Medicine)  Cancer Staging No matching staging information was found for the patient.   Oncology History   # AUG 2017-STAGE III [T1N1]SQUAMOUS CELL CA [s/p FNA; Dr.Vaught]  LEFT NECK/Submandibular LN- necrotic [~2-2.5cm]; Right neck [67mm]; SEP 22nd PET- left neck uptake; ? Tonsillar uptake L>R; OCT 2017- Left Tonsil POSITIVE;   # PET scan CR- no uptake; level II LN Left- 72mm [75mm]  # 2018/Jan -Left Ulnar nerve paralysis ? Trauma.   # Low testosterone [on testosterone shots]     Squamous cell carcinoma of head and neck ([place])   05/18/2016 Initial Diagnosis    Squamous cell carcinoma of head and neck (HCC)       INTERVAL HISTORY:  Philip Cordova 52 y.o.  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  Patient states his appetite is significant improved.  Taste buds are coming back.  Is gaining weight.  He is fairly back to his baseline.  Review of Systems  Constitutional: Negative for chills, diaphoresis, fever, malaise/fatigue and weight loss.  HENT: Negative for nosebleeds and sore throat.   Eyes: Negative for double vision.  Respiratory: Negative for cough, hemoptysis, sputum production, shortness of breath and wheezing.   Cardiovascular: Negative for chest pain, palpitations, orthopnea and leg swelling.  Gastrointestinal: Negative for abdominal pain, blood in stool, constipation, diarrhea, heartburn, melena, nausea and vomiting.  Genitourinary: Negative for dysuria, frequency and urgency.  Musculoskeletal: Negative for back pain and joint pain.  Skin: Negative.  Negative for itching and rash.  Neurological: Negative for dizziness, tingling, focal weakness, weakness and headaches.  Endo/Heme/Allergies: Does not bruise/bleed easily.  Psychiatric/Behavioral:  Negative for depression. The patient is not nervous/anxious and does not have insomnia.       PAST MEDICAL HISTORY :  Past Medical History:  Diagnosis Date  . Asthma   . 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 Chickenpox   . Squamous cell carcinoma of head and neck ([place]) 04/09/2016   Surgical resection, chemo + rad tx's.     PAST SURGICAL HISTORY :   Past Surgical History:  Procedure Laterality Date  . APPENDECTOMY  2008  . DIAGNOSTIC LARYNGOSCOPY  06/17/2016   Procedure: DIAGNOSTIC LARYNGOSCOPY WITH BIOPSIES;  Surgeon: Carloyn Manner, MD;  Location: [facility] ORS;  Service: ENT;;  . Moles removed  2005  . RHINOPLASTY  1990  . TONSILLECTOMY N/A 06/17/2016   Procedure: TONSILLECTOMY;  Surgeon: Carloyn Manner, MD;  Location: [facility] ORS;  Service: ENT;  Laterality: N/A;    FAMILY HISTORY :   Family History  Problem Relation Age of Onset  . Cancer Father        Skin  . Hyperlipidemia Father   . Hypertension Father        Possible  . Diabetes Paternal Uncle     SOCIAL HISTORY:   Social History   Tobacco Use  . Smoking status: Former Smoker    Years: 1.00    Types: Cigarettes  . Smokeless tobacco: Never Used  . Tobacco comment: "YEARS AGO"  Substance Use Topics  . Alcohol use: No    Comment: Occasionally  . Drug use: No    ALLERGIES:  is allergic to milk-related compounds.  MEDICATIONS:  Current Outpatient Medications  Medication Sig Dispense Refill  . albuterol (PROAIR HFA)  108 (90 Base) MCG/ACT inhaler Inhale 1 puff into the lungs every 6 (six) hours as needed for wheezing.     . fluticasone-salmeterol (ADVAIR HFA) 230-21 MCG/ACT inhaler Inhale 1 puff into the lungs 2 (two) times daily.    . Multiple Vitamins-Minerals (MULTIVITAMIN WITH MINERALS) tablet Take by mouth.    . Omega-3 Fatty Acids (FISH OIL) 1000 MG CAPS Take 1 capsule by mouth daily.     . valACYclovir (VALTREX) 1000 MG tablet Take 1 tablet (1,000 mg total) by mouth 2 (two) times daily. 20  tablet 0  . zolpidem (AMBIEN) 5 MG tablet Take 1 tablet (5 mg total) by mouth at bedtime as needed. for sleep 10 tablet 0  . neomycin-polymyxin-hydrocortisone (CORTISPORIN) OTIC solution Place 4 drops into both ears 3 (three) times daily. (Patient not taking: Reported on 03/30/2018) 10 mL 0   No current facility-administered medications for this visit.     PHYSICAL EXAMINATION: ECOG PERFORMANCE STATUS: 1 - Symptomatic but completely ambulatory  BP 120/72 (BP Location: Left Arm, Patient Position: Sitting)   Pulse 68   Temp (!) 97.1 F (36.2 C) (Tympanic)   Resp 16   Wt 174 lb (78.9 kg)   BMI 22.96 kg/m   Filed Weights   03/30/18 0922  Weight: 174 lb (78.9 kg)    GENERAL: Well-nourished well-developed; Alert, no distress and comfortable.  Alone. EYES: no pallor or icterus OROPHARYNX: no thrush or ulceration; NECK: supple; question lymph node felt in the right side of the neck submandibular region. LYMPH:  no palpable lymphadenopathy in the axillary or inguinal regions LUNGS: Decreased breath sounds auscultation bilaterally. No wheeze or crackles HEART/CVS: regular rate & rhythm and no murmurs; No lower extremity edema ABDOMEN:abdomen soft, non-tender and normal bowel sounds. No hepatomegaly or splenomegaly.  Musculoskeletal:no cyanosis of digits and no clubbing  PSYCH: alert & oriented x 3 with fluent speech NEURO: no focal motor/sensory deficits SKIN:  no rashes or significant lesions    LABORATORY DATA:  I have reviewed the data as listed    Component Value Date/Time   NA 138 03/30/2018 0846   NA 140 04/14/2016 1010   K 3.8 03/30/2018 0846   CL 104 03/30/2018 0846   CO2 25 03/30/2018 0846   GLUCOSE 99 03/30/2018 0846   BUN 20 03/30/2018 0846   BUN 19 04/14/2016 1010   CREATININE 1.07 03/30/2018 0846   CALCIUM 9.1 03/30/2018 0846   PROT 7.1 03/30/2018 0846   PROT 7.1 04/14/2016 1010   ALBUMIN 4.2 03/30/2018 0846   ALBUMIN 4.3 04/14/2016 1010   AST 23 03/30/2018  0846   ALT 18 03/30/2018 0846   ALKPHOS 52 03/30/2018 0846   BILITOT 1.0 03/30/2018 0846   BILITOT 0.8 04/14/2016 1010   GFRNONAA >60 03/30/2018 0846   GFRAA >60 03/30/2018 0846    No results found for: SPEP, UPEP  Lab Results  Component Value Date   WBC 5.2 03/30/2018   NEUTROABS 2.9 03/30/2018   HGB 13.6 03/30/2018   HCT 39.6 (L) 03/30/2018   MCV 92.9 03/30/2018   PLT 259 03/30/2018      Chemistry      Component Value Date/Time   NA 138 03/30/2018 0846   NA 140 04/14/2016 1010   K 3.8 03/30/2018 0846   CL 104 03/30/2018 0846   CO2 25 03/30/2018 0846   BUN 20 03/30/2018 0846   BUN 19 04/14/2016 1010   CREATININE 1.07 03/30/2018 0846   GLU 100 01/07/2015  Component Value Date/Time   CALCIUM 9.1 03/30/2018 0846   ALKPHOS 52 03/30/2018 0846   AST 23 03/30/2018 0846   ALT 18 03/30/2018 0846   BILITOT 1.0 03/30/2018 0846   BILITOT 0.8 04/14/2016 1010       RADIOGRAPHIC STUDIES: I have personally reviewed the radiological images as listed and agreed with the findings in the report. No results found.   ASSESSMENT & PLAN:  Squamous cell carcinoma of head and neck (HCC) # Squamous cell carcinoma-  Left BOT/Tonsillar- T1N1- STAGE III HPV- positive/ p16 positive. Last RT- jan 22nd 2018.  Currently on surveillance.  Stable  #Question right neck lymph node-check CT of the neck ASAP.  #Left upper extremity weakness/ulnar nerve injury-status post trauma currently resolved.   # follow up based on above work up. CT ASAP [call- [phone]]   Orders Placed This Encounter  Procedures  . CT SOFT TISSUE NECK W CONTRAST    Standing Status:   Future    Number of Occurrences:   1    Standing Expiration Date:   07/01/2019    Order Specific Question:   ** REASON FOR EXAM (FREE TEXT)    Answer:   hx of SCC of tonsil; right neck LN    Order Specific Question:   If indicated for the ordered procedure, I authorize the administration of contrast media per Radiology protocol     Answer:   Yes    Order Specific Question:   Preferred imaging location?    Answer:   [facility]    Order Specific Question:   Radiology Contrast Protocol - do NOT remove file path    Answer:   \\[email or web address]   All questions were answered. The patient knows to call the clinic with any problems, questions or concerns.      Cammie Sickle, MD 04/09/2018 9:28 PM

## 2018-04-05 ENCOUNTER — Ambulatory Visit
Admission: RE | Admit: 2018-04-05 | Discharge: 2018-04-05 | Disposition: A | Discharge status: Home / Self Care | Payer: Self-pay | Source: Ambulatory Visit | Attending: Internal Medicine | Admitting: Internal Medicine

## 2018-04-05 DIAGNOSIS — C760 Malignant neoplasm of head, face and neck: Secondary | ICD-10-CM | POA: Insufficient documentation

## 2018-04-05 DIAGNOSIS — R930 Abnormal findings on diagnostic imaging of skull and head, not elsewhere classified: Secondary | ICD-10-CM | POA: Insufficient documentation

## 2018-04-05 HISTORY — DX: Malignant neoplasm of head, face and neck: C76.0

## 2018-04-05 MED ORDER — IOHEXOL 300 MG/ML  SOLN
75.0000 mL | Freq: Once | INTRAMUSCULAR | Status: AC | PRN
  Administered 2018-04-05: 75 mL via INTRAVENOUS

## 2018-04-10 ENCOUNTER — Ambulatory Visit: Payer: Self-pay | Admitting: Pharmacy Technician

## 2018-04-10 ENCOUNTER — Telehealth: Payer: Self-pay | Admitting: Internal Medicine

## 2018-04-10 DIAGNOSIS — Z79899 Other long term (current) drug therapy: Secondary | ICD-10-CM

## 2018-04-10 NOTE — Telephone Encounter (Signed)
Reviewed the CT scan results with pt. Recommend follow up with Dr.Vaught.  Follow up with [place] in 3 months/no labs. Please schedule. Pt agrees.   GB

## 2018-04-10 NOTE — Progress Notes (Signed)
  Completed [facility] application and contract.  Patient agreed to all terms of the [facility] contract.    Patient approved to receive medication assistance at [facility] as long as eligibility criteria continues to be met.    Provided patient with Civil engineer, contracting based on his particular needs.    Advair HFA & Proventil HFA Prescription Applications completed with patient.  Forwarded to Dr. [name] for signature.  Upon receipt of signed applications from provider, Advair [facility] Prescription Application will be submitted to [place] and Proventil HFA Prescription Application will be submitted to [facility].  [facility]

## 2018-04-11 ENCOUNTER — Telehealth: Payer: Self-pay | Admitting: Family Medicine

## 2018-04-11 MED ORDER — ALBUTEROL SULFATE HFA 108 (90 BASE) MCG/ACT IN AERS: 1.0000 | INHALATION_SPRAY | Freq: Four times a day (QID) | RESPIRATORY_TRACT | 0 refills | Status: DC | PRN

## 2018-04-11 MED ORDER — FLUTICASONE-SALMETEROL 230-21 MCG/ACT IN AERO: 1.0000 | INHALATION_SPRAY | Freq: Two times a day (BID) | RESPIRATORY_TRACT | 1 refills | Status: DC

## 2018-04-11 NOTE — Telephone Encounter (Signed)
Prescriptions printed.  Please contact medication management clinic and see how to fax this to them.  Thanks.

## 2018-04-11 NOTE — Telephone Encounter (Signed)
-----   Message from [name] sent at 04/10/2018  1:58 PM EDT ----- Patient has been approved to receive medication [facility] at [facility].  We are ordering ProAir and Advair HFA 230/21 from pharmaceutical companies.  Will take about 4 weeks for these medications to arrive.  Can bridge patient with Albuterol and Advair 250/50 during interim.  However, need prescriptions for Albuterol and Advair sent to our clinic.  Directions for the Albuterol should be:  Inhale 1 puff into lungs every 6 hours as needed for wheezing.  Directions for Advair should be:  Inhale 1 puff into the lungs two times a day.  Thank you.

## 2018-04-12 NOTE — Telephone Encounter (Signed)
Patient states he has a history of ear infections and has always kept this on hand for as needed

## 2018-04-12 NOTE — Telephone Encounter (Signed)
This is not a medication that I typically give people to have on hand to use as needed.  If he has issues with his ears he needs to be evaluated when they occur.  Thanks.

## 2018-04-12 NOTE — Telephone Encounter (Signed)
Faxed to medication management. Patient would also like a refill for his ear drops, the cortisporin listed in his chart. He would like this sent to medication management

## 2018-04-12 NOTE — Telephone Encounter (Signed)
Please find out why he needs a refill of this medication. This is a medication that is used for acute ear issues and he would need to be evaluated to determine the appropriateness of a refill. Thanks.

## 2018-04-13 ENCOUNTER — Telehealth: Payer: Self-pay | Admitting: Family Medicine

## 2018-04-13 NOTE — Telephone Encounter (Signed)
Left message to return call, ok for pec to speak to patient and inform him of message below 

## 2018-04-13 NOTE — Telephone Encounter (Signed)
Med management dropped off forms to be filled out. Placed in Dr. Ellen Henri color folder up front. Please call Anne Ng at [phone] when completed

## 2018-04-13 NOTE — Telephone Encounter (Signed)
Placed on Dr. Sonnenberg's desk  

## 2018-04-14 NOTE — Telephone Encounter (Signed)
Signed. Please make available for pick up.  

## 2018-04-17 NOTE — Telephone Encounter (Signed)
Called Philip Cordova and informed her that papers have been signed by Dr. [name] and are ready to be picked up

## 2018-05-22 ENCOUNTER — Other Ambulatory Visit: Payer: Self-pay | Admitting: Internal Medicine

## 2018-06-30 ENCOUNTER — Telehealth: Payer: Self-pay | Admitting: Family Medicine

## 2018-06-30 ENCOUNTER — Other Ambulatory Visit: Payer: Self-pay | Admitting: Family Medicine

## 2018-06-30 NOTE — Telephone Encounter (Signed)
Patient called, left VM to return call to the office to discuss refill request.

## 2018-06-30 NOTE — Telephone Encounter (Signed)
Copied from [place] [phone]. Topic: General - Other >> Jun 30, 2018  2:12 PM Keene Breath wrote: Reason for CRM: Christine with [facility] called to request that the patient receive for a 1x only, Advair 250/50 medication because it will take up to 10 days for him to receive his fluticasone-salmeterol (ADVAIR HFA) 230-21 MCG/ACT inhaler.  Altha Harm feels that the patient is agitated and patient does not want to wait that long, so it was suggested that he receive the other Advair.  Please call with any questions to [phone].  CB # for the patient is [phone]

## 2018-06-30 NOTE — Telephone Encounter (Signed)
Called Christine and left a VM to call us back. Left a call back number.

## 2018-06-30 NOTE — Telephone Encounter (Signed)
Copied from [place] [phone]. Topic: Quick Communication - Rx Refill/Question >> Jun 30, 2018  2:22 PM [name] wrote: Medication: valACYclovir (VALTREX) 1000 MG tablet [[id number]] Has the patient contacted their pharmacy? Yes  (Agent: If no, request that the patient contact the pharmacy for the refill.) (Agent: If yes, when and what did the pharmacy advise?)  Preferred Pharmacy (with phone number or street name): [facility] [place], [place] [phone] (Phone)   Agent: Please be advised that RX refills may take up to 3 business days. We ask that you follow-up with your pharmacy.

## 2018-06-30 NOTE — Telephone Encounter (Signed)
Last refilled. Pt will need an apt for more refills. Thanks

## 2018-06-30 NOTE — Telephone Encounter (Signed)
Requested medication (s) are due for refill today: Yes  Requested medication (s) are on the active medication list: Yes  Last refill:  10/05/16  Future visit scheduled: No  Notes to clinic:  Expired and filled by another provider.     Requested Prescriptions  Pending Prescriptions Disp Refills   valACYclovir (VALTREX) 1000 MG tablet 20 tablet 0    Sig: Take 1 tablet (1,000 mg total) by mouth 2 (two) times daily.     Antimicrobials:  Antiviral Agents - Anti-Herpetic Failed - 06/30/2018  4:36 PM      Failed - Valid encounter within last 12 months    Recent Outpatient Visits          1 year ago Acute otitis externa of left ear, unspecified type   [facility] [name], MD   1 year ago Lesion of soft palate   [facility] [name], MD   1 year ago Neck pain   [facility] [name], MD   1 year ago Pneumonia of right lung due to infectious organism, unspecified part of lung   [facility] [place], [name], MD

## 2018-06-30 NOTE — Telephone Encounter (Signed)
Please contact medication management and if they want [place] to send in Advair 250/50 to them.  Thanks.

## 2018-06-30 NOTE — Telephone Encounter (Signed)
Sent to PCP please advise.  

## 2018-07-03 MED ORDER — FLUTICASONE-SALMETEROL 250-50 MCG/DOSE IN AEPB: 1.0000 | INHALATION_SPRAY | Freq: Two times a day (BID) | RESPIRATORY_TRACT | 3 refills | Status: DC

## 2018-07-03 MED ORDER — VALACYCLOVIR HCL 1 G PO TABS: 1000.0000 mg | ORAL_TABLET | Freq: Two times a day (BID) | ORAL | 0 refills | Status: DC

## 2018-07-03 NOTE — Telephone Encounter (Signed)
Called [facility] and left a call back number for them to call back in regards to patient left patient's name and DOB.

## 2018-07-03 NOTE — Telephone Encounter (Signed)
[facility] called back they would like for [place] to send in this RX. Rx has been sent.

## 2018-07-06 ENCOUNTER — Telehealth: Payer: Self-pay

## 2018-07-06 MED ORDER — FLUTICASONE-SALMETEROL 230-21 MCG/ACT IN AERO: 2.0000 | INHALATION_SPRAY | Freq: Two times a day (BID) | RESPIRATORY_TRACT | 1 refills | Status: DC

## 2018-07-06 NOTE — Telephone Encounter (Signed)
Philip Cordova with [facility] calling to clarify the directions for the fluticasone-salmeterol (ADVAIR HFA) 230-21 MCG/ACT inhaler  Pt never got this filled in August, and requesting from them now. However, Philip Cordova states the typical directions for this inhaler is 2 puffs,  2 times a day.  This was originally sent: 1 puff, 2 times a day  Philip Cordova would like a call back clarify directions.

## 2018-07-06 NOTE — Telephone Encounter (Signed)
Error

## 2018-07-06 NOTE — Telephone Encounter (Signed)
Called the pharmacy and gave them the [facility] to change Rx to 2 puffs twice daily.

## 2018-07-12 ENCOUNTER — Inpatient Hospital Stay: Payer: Self-pay | Attending: Internal Medicine | Admitting: Internal Medicine

## 2018-07-12 ENCOUNTER — Other Ambulatory Visit: Payer: Self-pay

## 2018-07-12 ENCOUNTER — Encounter: Payer: Self-pay | Admitting: Internal Medicine

## 2018-07-12 VITALS — BP 123/76 | HR 58 | Temp 97.7°F | Resp 16 | Ht 73.0 in | Wt 185.6 lb

## 2018-07-12 DIAGNOSIS — Z224 Carrier of infections with a predominantly sexual mode of transmission: Secondary | ICD-10-CM | POA: Insufficient documentation

## 2018-07-12 DIAGNOSIS — Z79899 Other long term (current) drug therapy: Secondary | ICD-10-CM | POA: Insufficient documentation

## 2018-07-12 DIAGNOSIS — Z87891 Personal history of nicotine dependence: Secondary | ICD-10-CM | POA: Insufficient documentation

## 2018-07-12 DIAGNOSIS — C760 Malignant neoplasm of head, face and neck: Secondary | ICD-10-CM | POA: Insufficient documentation

## 2018-07-12 DIAGNOSIS — Z923 Personal history of irradiation: Secondary | ICD-10-CM | POA: Insufficient documentation

## 2018-07-12 NOTE — Progress Notes (Signed)
Patient here for follow up. He feels great!

## 2018-07-12 NOTE — Progress Notes (Signed)
[facility] OFFICE PROGRESS NOTE  Patient Care Team: [name], MD as PCP - General (Family Medicine)  Cancer Staging No matching staging information was found for the patient.   Oncology History   # AUG 2017-STAGE III [T1N1]SQUAMOUS CELL CA [s/p FNA; Dr.Vaught]  LEFT NECK/Submandibular LN- necrotic [~2-2.5cm]; Right neck [82mm]; SEP 22nd PET- left neck uptake; ? Tonsillar uptake L>R; OCT 2017- Left Tonsil POSITIVE;   # PET scan CR- no uptake; level II LN Left- 63mm [6mm]  # 2018/Jan -Left Ulnar nerve paralysis ? Trauma.   # Low testosterone [on testosterone shots] --------------------------------------------------------------   DIAGNOSIS: BOT   STAGE:   III ;GOALS: cure  CURRENT/MOST RECENT THERAPY: surveillaince      Squamous cell carcinoma of head and neck ([place])   05/18/2016 Initial Diagnosis    Squamous cell carcinoma of head and neck (HCC)       INTERVAL HISTORY:  Philip Cordova 52 y.o.  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  Denies any new lumps or bumps.  Appetite is good.  No weight loss.  No nausea no vomiting.  Gaining weight.  Review of Systems  Constitutional: Negative for chills, diaphoresis, fever, malaise/fatigue and weight loss.  HENT: Negative for nosebleeds and sore throat.   Eyes: Negative for double vision.  Respiratory: Negative for cough, hemoptysis, sputum production, shortness of breath and wheezing.   Cardiovascular: Negative for chest pain, palpitations, orthopnea and leg swelling.  Gastrointestinal: Negative for abdominal pain, blood in stool, constipation, diarrhea, heartburn, melena, nausea and vomiting.  Genitourinary: Negative for dysuria, frequency and urgency.  Musculoskeletal: Negative for back pain and joint pain.  Skin: Negative.  Negative for itching and rash.  Neurological: Negative for dizziness,  tingling, focal weakness, weakness and headaches.  Endo/Heme/Allergies: Does not bruise/bleed easily.  Psychiatric/Behavioral: Negative for depression. The patient is not nervous/anxious and does not have insomnia.       PAST MEDICAL HISTORY :  Past Medical History:  Diagnosis Date  . Asthma   . 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 Chickenpox   . Squamous cell carcinoma of head and neck ([place]) 04/09/2016   Surgical resection, chemo + rad tx's.     PAST SURGICAL HISTORY :   Past Surgical History:  Procedure Laterality Date  . APPENDECTOMY  2008  . DIAGNOSTIC LARYNGOSCOPY  06/17/2016   Procedure: DIAGNOSTIC LARYNGOSCOPY WITH BIOPSIES;  Surgeon: Carloyn Manner, MD;  Location: [facility] ORS;  Service: ENT;;  . Moles removed  2005  . RHINOPLASTY  1990  . TONSILLECTOMY N/A 06/17/2016   Procedure: TONSILLECTOMY;  Surgeon: Carloyn Manner, MD;  Location: [facility] ORS;  Service: ENT;  Laterality: N/A;    FAMILY HISTORY :   Family History  Problem Relation Age of Onset  . Cancer Father        Skin  . Hyperlipidemia Father   . Hypertension Father        Possible  . Diabetes Paternal Uncle     SOCIAL HISTORY:   Social History   Tobacco Use  . Smoking status: Former Smoker    Years: 1.00    Types: Cigarettes  . Smokeless tobacco: Never Used  . Tobacco comment: "YEARS AGO"  Substance Use Topics  . Alcohol use: No    Comment: Occasionally  . Drug use: No    ALLERGIES:  is allergic to milk-related compounds.  MEDICATIONS:  Current Outpatient Medications  Medication Sig Dispense Refill  . Fluticasone-Salmeterol (ADVAIR DISKUS) 250-50 MCG/DOSE AEPB Inhale 1 puff into the lungs 2 (two) times daily. 1 each 3  . fluticasone-salmeterol (ADVAIR HFA) 230-21 MCG/ACT inhaler Inhale 2 puffs into the lungs 2 (two) times daily. 1 Inhaler 1  . Multiple Vitamins-Minerals (MULTIVITAMIN WITH MINERALS) tablet Take by mouth.    . neomycin-polymyxin-hydrocortisone (CORTISPORIN) OTIC  solution Place 4 drops into both ears 3 (three) times daily. 10 mL 0  . Omega-3 Fatty Acids (FISH OIL) 1000 MG CAPS Take 1 capsule by mouth daily.     Marland Kitchen PROVENTIL HFA 108 (90 Base) MCG/ACT inhaler INHALE 1 PUFF INTO THE LUNGS EVERY 6 HOURS AS NEEDED FOR WHEEZING. 6.7 g 0  . valACYclovir (VALTREX) 1000 MG tablet Take 1 tablet (1,000 mg total) by mouth 2 (two) times daily. 20 tablet 0  . zolpidem (AMBIEN) 5 MG tablet Take 1 tablet (5 mg total) by mouth at bedtime as needed. for sleep 10 tablet 0   No current facility-administered medications for this visit.     PHYSICAL EXAMINATION: ECOG PERFORMANCE STATUS: 1 - Symptomatic but completely ambulatory  BP 123/76 (BP Location: Right Arm, Patient Position: Sitting)   Pulse (!) 58   Temp 97.7 F (36.5 C)   Resp 16   Ht 6\' 1"  (1.854 m)   Wt 185 lb 9.6 oz (84.2 kg)   BMI 24.49 kg/m   Filed Weights   07/12/18 1452  Weight: 185 lb 9.6 oz (84.2 kg)   Physical Exam  Constitutional: He is oriented to person, place, and time and well-developed, well-nourished, and in no distress.  HENT:  Head: Normocephalic and atraumatic.  Mouth/Throat: Oropharynx is clear and moist. No oropharyngeal exudate.  Eyes: Pupils are equal, round, and reactive to light.  Neck: Normal range of motion. Neck supple.  Cardiovascular: Normal rate and regular rhythm.  Pulmonary/Chest: No respiratory distress. He has no wheezes.  Abdominal: Soft. Bowel sounds are normal. He exhibits no distension and no mass. There is no tenderness. There is no rebound and no guarding.  Musculoskeletal: Normal range of motion. He exhibits no edema or tenderness.  Neurological: He is alert and oriented to person, place, and time.  Skin: Skin is warm.  Psychiatric: Affect normal.       LABORATORY DATA:  I have reviewed the data as listed    Component Value Date/Time   NA 138 03/30/2018 0846   NA 140 04/14/2016 1010   K 3.8 03/30/2018 0846   CL 104 03/30/2018 0846   CO2 25  03/30/2018 0846   GLUCOSE 99 03/30/2018 0846   BUN 20 03/30/2018 0846   BUN 19 04/14/2016 1010   CREATININE 1.07 03/30/2018 0846   CALCIUM 9.1 03/30/2018 0846   PROT 7.1 03/30/2018 0846   PROT 7.1 04/14/2016 1010   ALBUMIN 4.2 03/30/2018 0846   ALBUMIN 4.3 04/14/2016 1010   AST 23 03/30/2018 0846   ALT 18 03/30/2018 0846   ALKPHOS 52 03/30/2018 0846   BILITOT 1.0 03/30/2018 0846   BILITOT 0.8 04/14/2016 1010   GFRNONAA >60 03/30/2018 0846   GFRAA >60 03/30/2018 0846    No results found for: SPEP, UPEP  Lab Results  Component Value Date   WBC 5.2 03/30/2018   NEUTROABS 2.9 03/30/2018   HGB 13.6 03/30/2018   HCT 39.6 (L) 03/30/2018   MCV 92.9 03/30/2018   PLT 259 03/30/2018      Chemistry      Component  Value Date/Time   NA 138 03/30/2018 0846   NA 140 04/14/2016 1010   K 3.8 03/30/2018 0846   CL 104 03/30/2018 0846   CO2 25 03/30/2018 0846   BUN 20 03/30/2018 0846   BUN 19 04/14/2016 1010   CREATININE 1.07 03/30/2018 0846   GLU 100 01/07/2015      Component Value Date/Time   CALCIUM 9.1 03/30/2018 0846   ALKPHOS 52 03/30/2018 0846   AST 23 03/30/2018 0846   ALT 18 03/30/2018 0846   BILITOT 1.0 03/30/2018 0846   BILITOT 0.8 04/14/2016 1010       RADIOGRAPHIC STUDIES: I have personally reviewed the radiological images as listed and agreed with the findings in the report. No results found.   ASSESSMENT & PLAN:  Squamous cell carcinoma of head and neck (HCC) # Squamous cell carcinoma-  Left BOT/Tonsillar- T1N1- STAGE III HPV- positive/ p16 positive. Last RT- jan 22nd 2018.  Currently on surveillance.  Stable.  #Left upper extremity weakness/ulnar nerve injury-status post trauma currently resolved.   # DISPOSITION: # Follow up in 6 months-MD/labs- cbc/cmp/TSH- Dr.B   No orders of the defined types were placed in this encounter.  All questions were answered. The patient knows to call the clinic with any problems, questions or concerns.      Cammie Sickle, MD 08/01/2018 7:55 PM

## 2018-07-12 NOTE — Assessment & Plan Note (Addendum)
#   Squamous cell carcinoma-  Left BOT/Tonsillar- T1N1- STAGE III HPV- positive/ p16 positive. Last RT- jan 22nd 2018.  Currently on surveillance.  Stable.  #Left upper extremity weakness/ulnar nerve injury-status post trauma currently resolved.   # DISPOSITION: # Follow up in 6 months-MD/labs- cbc/cmp/TSH- Dr.B

## 2018-08-05 ENCOUNTER — Other Ambulatory Visit: Payer: Self-pay | Admitting: Nurse Practitioner

## 2018-08-09 ENCOUNTER — Ambulatory Visit (INDEPENDENT_AMBULATORY_CARE_PROVIDER_SITE_OTHER): Payer: Self-pay

## 2018-08-09 ENCOUNTER — Encounter: Payer: Self-pay | Admitting: Family Medicine

## 2018-08-09 ENCOUNTER — Ambulatory Visit (INDEPENDENT_AMBULATORY_CARE_PROVIDER_SITE_OTHER): Payer: Self-pay | Admitting: Family Medicine

## 2018-08-09 VITALS — BP 100/60 | HR 60 | Temp 98.0°F | Resp 16 | Ht 73.0 in | Wt 189.0 lb

## 2018-08-09 DIAGNOSIS — M79644 Pain in right finger(s): Secondary | ICD-10-CM

## 2018-08-09 DIAGNOSIS — M25561 Pain in right knee: Secondary | ICD-10-CM

## 2018-08-09 DIAGNOSIS — H60502 Unspecified acute noninfective otitis externa, left ear: Secondary | ICD-10-CM

## 2018-08-09 MED ORDER — NEOMYCIN-POLYMYXIN-HC 3.5-10000-1 OT SOLN: 4.0000 [drp] | Freq: Three times a day (TID) | OTIC | 0 refills | Status: DC

## 2018-08-09 MED ORDER — NEOMYCIN-POLYMYXIN-HC 3.5-10000-1 OT SOLN: 4.0000 [drp] | Freq: Three times a day (TID) | OTIC | 0 refills | Status: AC

## 2018-08-09 NOTE — Progress Notes (Signed)
Subjective:    Patient ID: Philip Cordova, male    DOB: 03/16/1966, 52 y.o.   MRN: [id number]  HPI  Presents to clinic c/o R knee pain, R pinky finger pain and pain in left ear.  Patient states about 3 weeks ago he went to do a kicking motion sideways while working out and his knee twisted, causing pain.  He has been trying to monitor pain and watch for improvement with rest and stretching exercises, but pain continues to be mainly on the inner side of right knee.  Patient also complains of pain in right pinky finger, states he slammed hand down months ago and still has pain in the finger.  Patient also suspects he possibly has a external ear infection.  He has a history of eczema and will get skin buildup in ear, leading to otitis externa.    Patient Active Problem List   Diagnosis Date Noted  . Otitis externa 06/10/2017  . Lesion of soft palate 02/11/2017  . Neck pain 01/14/2017  . Pneumonia 11/10/2016  . Anxiety and depression 11/10/2016  . 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 Dehydration 09/03/2016  . Mucositis due to antineoplastic therapy 08/25/2016  . Dehydration with hyponatremia 08/25/2016  . Oral bleeding 08/25/2016  . Dysgeusia 08/25/2016  . Insomnia due to medical condition 08/25/2016  . 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 Cervical lymphadenopathy 04/27/2016  . 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 Microcytic hypochromic anemia 01/08/2014  . Asthma 06/22/2012   Social History   Tobacco Use  . Smoking status: Former Smoker    Years: 1.00    Types: Cigarettes  . Smokeless tobacco: Never Used  . Tobacco comment: "YEARS AGO"  Substance Use Topics  . Alcohol use: No    Comment: Occasionally   Review of Systems  Constitutional: Negative for chills, fatigue and fever.  HENT: Negative for congestion, sinus pain and sore throat. +left ear pain   Eyes: Negative.   Respiratory: Negative for cough,  shortness of breath and wheezing.   Cardiovascular: Negative for chest pain, palpitations and leg swelling.  Gastrointestinal: Negative for abdominal pain, diarrhea, nausea and vomiting.  Genitourinary: Negative for dysuria, frequency and urgency.  Musculoskeletal: +knee pain R, pinky finger pain R Skin: Negative for color change, pallor and rash.  Neurological: Negative for syncope, light-headedness and headaches.  Psychiatric/Behavioral: The patient is not nervous/anxious.       Objective:   Physical Exam  Constitutional: He is oriented to person, place, and time.  HENT:  Head: Normocephalic and atraumatic.  Right Ear: Tympanic membrane normal.  Left Ear: Tympanic membrane normal.  +redness and flaking skin of left ear canal  Eyes: Conjunctivae and EOM are normal. No scleral icterus.  Neck: Neck supple. No tracheal deviation present.  Cardiovascular: Normal rate and regular rhythm.  Pulmonary/Chest: Effort normal and breath sounds normal. No respiratory distress.  Musculoskeletal: He exhibits no edema or deformity.       Hands:      Legs: Location of pain on right knee and in right pinky finger represented by red lines on diagram.  Range of motion of knee intact, does have pain at extreme limits of flexion, but extension is okay.  Negative anterior posterior drawer test of right knee.  No joint laxity.  Range of motion of all fingers intact.  Able to make a tight fist and also extend all fingers out completely.  Neurological: He is alert and  oriented to person, place, and time. No cranial nerve deficit. Coordination normal.  Skin: Skin is warm and dry. No pallor.  Psychiatric: He has a normal mood and affect. His behavior is normal.  Nursing note and vitals reviewed.     Vitals:   08/09/18 0809  BP: 100/60  Pulse: 60  Resp: 16  Temp: 98 F (36.7 C)  SpO2: 97%   Assessment & Plan:   Right knee pain, right finger pain-we will get x-ray of both right knee and right hand in  clinic.  Discussed taking anti-inflammatory medicine to help calm down pain inflammation such as an Aleve or ibuprofen.  Patient states he prefers not to take medicine and he would rather do stretching and muscle belly exercises instead.  Otitis externa left ear-patient will do neomycin polymyxin hydrocortisone drops to treat this.  Patient has used these eardrops in the past with great success.  Patient will keep regularly scheduled follow-up with PCP as planned.  Advised to return to clinic sooner if any issues arise

## 2018-09-19 ENCOUNTER — Telehealth: Payer: Self-pay | Admitting: Pharmacist

## 2018-09-19 NOTE — Telephone Encounter (Signed)
09/19/2018 10:03:43 AM - ProAir HFA refill  09/19/2018 Called [facility] for refill on ProAir HFA-allow 5-7 business days to receive per [facility].Delos Haring

## 2018-09-29 ENCOUNTER — Other Ambulatory Visit: Payer: Self-pay | Admitting: Internal Medicine

## 2018-10-31 ENCOUNTER — Telehealth: Payer: Self-pay | Admitting: Pharmacist

## 2018-10-31 NOTE — Telephone Encounter (Signed)
10/31/2018 12:22:01 PM - Advair HFA 230/21 refill  10/31/2018 Placed refill online with [facility] for Advair HFA 230/21, will release 11/30/2018, order# [id number].Delos Haring

## 2019-01-10 ENCOUNTER — Inpatient Hospital Stay: Payer: Self-pay | Admitting: Internal Medicine

## 2019-01-10 ENCOUNTER — Inpatient Hospital Stay: Payer: Self-pay

## 2019-02-19 ENCOUNTER — Telehealth: Payer: Self-pay | Admitting: Pharmacist

## 2019-02-19 NOTE — Telephone Encounter (Signed)
02/19/2019 8:28:42 AM - ProAir refill  02/19/2019 Faxed [facility] a refill for ProAir.Delos Haring

## 2019-02-21 ENCOUNTER — Other Ambulatory Visit: Payer: Self-pay | Admitting: Family Medicine

## 2019-08-22 ENCOUNTER — Telehealth: Payer: Self-pay | Admitting: Pharmacy Technician

## 2019-08-22 NOTE — Telephone Encounter (Signed)
Patient failed to provide requested 2020 financial documentation.  No additional medication assistance will be provided by [facility] without the required proof of income documentation.  Patient notified by letter.  Philip Cordova Care Manager [facility] 

## 2019-11-05 ENCOUNTER — Telehealth: Payer: Self-pay | Admitting: Family Medicine

## 2019-11-05 MED ORDER — VALACYCLOVIR HCL 1 G PO TABS: ORAL_TABLET | ORAL | 0 refills | Status: DC

## 2019-11-05 NOTE — Telephone Encounter (Signed)
Pt needs prescription for Valtrex filled and sent to [facility] on [address].

## 2020-02-07 ENCOUNTER — Other Ambulatory Visit: Payer: Self-pay | Admitting: Otolaryngology

## 2020-02-07 DIAGNOSIS — L040 Acute lymphadenitis of face, head and neck: Secondary | ICD-10-CM

## 2020-02-19 ENCOUNTER — Ambulatory Visit
Admission: RE | Admit: 2020-02-19 | Discharge: 2020-02-19 | Disposition: A | Discharge status: Home / Self Care | Payer: 59 | Source: Ambulatory Visit | Attending: Otolaryngology | Admitting: Otolaryngology

## 2020-02-19 ENCOUNTER — Other Ambulatory Visit: Payer: Self-pay

## 2020-02-19 DIAGNOSIS — L040 Acute lymphadenitis of face, head and neck: Secondary | ICD-10-CM | POA: Diagnosis present

## 2020-02-25 ENCOUNTER — Other Ambulatory Visit: Payer: Self-pay | Admitting: Otolaryngology

## 2020-02-25 DIAGNOSIS — R599 Enlarged lymph nodes, unspecified: Secondary | ICD-10-CM

## 2020-03-04 ENCOUNTER — Other Ambulatory Visit: Payer: Self-pay

## 2020-03-04 ENCOUNTER — Ambulatory Visit
Admission: RE | Admit: 2020-03-04 | Discharge: 2020-03-04 | Disposition: A | Discharge status: Home / Self Care | Payer: 59 | Source: Ambulatory Visit | Attending: Otolaryngology | Admitting: Otolaryngology

## 2020-03-04 DIAGNOSIS — R599 Enlarged lymph nodes, unspecified: Secondary | ICD-10-CM

## 2020-03-04 MED ORDER — IOHEXOL 300 MG/ML  SOLN
75.0000 mL | Freq: Once | INTRAMUSCULAR | Status: AC | PRN
  Administered 2020-03-04: 75 mL via INTRAVENOUS

## 2020-05-16 ENCOUNTER — Encounter: Payer: Self-pay | Admitting: Family Medicine

## 2020-05-16 ENCOUNTER — Other Ambulatory Visit: Payer: Self-pay

## 2020-05-16 ENCOUNTER — Ambulatory Visit (INDEPENDENT_AMBULATORY_CARE_PROVIDER_SITE_OTHER): Payer: 59 | Admitting: Family Medicine

## 2020-05-16 VITALS — BP 105/70 | HR 54 | Temp 97.9°F | Ht 73.0 in | Wt 188.2 lb

## 2020-05-16 DIAGNOSIS — Z13220 Encounter for screening for lipoid disorders: Secondary | ICD-10-CM

## 2020-05-16 DIAGNOSIS — Z1329 Encounter for screening for other suspected endocrine disorder: Secondary | ICD-10-CM | POA: Diagnosis not present

## 2020-05-16 DIAGNOSIS — K625 Hemorrhage of anus and rectum: Secondary | ICD-10-CM | POA: Diagnosis not present

## 2020-05-16 DIAGNOSIS — G5632 Lesion of radial nerve, left upper limb: Secondary | ICD-10-CM

## 2020-05-16 DIAGNOSIS — Z1211 Encounter for screening for malignant neoplasm of colon: Secondary | ICD-10-CM | POA: Diagnosis not present

## 2020-05-16 DIAGNOSIS — E291 Testicular hypofunction: Secondary | ICD-10-CM | POA: Diagnosis not present

## 2020-05-16 DIAGNOSIS — Z0001 Encounter for general adult medical examination with abnormal findings: Secondary | ICD-10-CM | POA: Insufficient documentation

## 2020-05-16 DIAGNOSIS — Z1159 Encounter for screening for other viral diseases: Secondary | ICD-10-CM

## 2020-05-16 DIAGNOSIS — Z0000 Encounter for general adult medical examination without abnormal findings: Secondary | ICD-10-CM | POA: Insufficient documentation

## 2020-05-16 DIAGNOSIS — Z131 Encounter for screening for diabetes mellitus: Secondary | ICD-10-CM | POA: Insufficient documentation

## 2020-05-16 DIAGNOSIS — Z125 Encounter for screening for malignant neoplasm of prostate: Secondary | ICD-10-CM

## 2020-05-16 DIAGNOSIS — J4530 Mild persistent asthma, uncomplicated: Secondary | ICD-10-CM

## 2020-05-16 DIAGNOSIS — Z23 Encounter for immunization: Secondary | ICD-10-CM | POA: Diagnosis not present

## 2020-05-16 DIAGNOSIS — Z130 Encounter for screening for diseases of the blood and blood-forming organs and certain disorders involving the immune mechanism: Secondary | ICD-10-CM

## 2020-05-16 MED ORDER — TRIAMCINOLONE ACETONIDE 0.1 % EX LOTN: 1.0000 "application " | TOPICAL_LOTION | Freq: Two times a day (BID) | CUTANEOUS | 0 refills | Status: DC | PRN

## 2020-05-16 MED ORDER — FLUTICASONE-SALMETEROL 250-50 MCG/DOSE IN AEPB: 1.0000 | INHALATION_SPRAY | Freq: Two times a day (BID) | RESPIRATORY_TRACT | 3 refills | Status: DC

## 2020-05-16 MED ORDER — ALBUTEROL SULFATE HFA 108 (90 BASE) MCG/ACT IN AERS: INHALATION_SPRAY | RESPIRATORY_TRACT | 0 refills | Status: DC

## 2020-05-16 MED ORDER — VALACYCLOVIR HCL 1 G PO TABS: ORAL_TABLET | ORAL | 0 refills | Status: DC

## 2020-05-16 NOTE — Progress Notes (Signed)
Tommi Rumps, MD Phone: [phone]  Philip Cordova is a 54 y.o. male who presents today for CPE.  Diet: Very healthy diet.  No processed foods.  No dairy or gluten. Exercise: Exercising a lot.  Has his resting heart rate down into the 40s. Colonoscopy: Due Prostate cancer screening: Due Family history-  Prostate cancer: No  Colon cancer: No Sexually active: Not recently Vaccines-   Flu: Given today  Tetanus: Up-to-date  Shingles: Given today  COVID19: Up-to-date HIV screening: Has had this in the past Hep C Screening: Due Tobacco use: No Alcohol use: No  Illicit Drug use: No Dentist: Yes Ophthalmology: No Reports issues with hemorrhoids.  No pain.  Pushes them back in.  Occasional bright red blood. Rash for which he has seen dermatology.  States he was advised it was an eczema-like rash.  Notes it is on his chest and they gave him triamcinolone ointment which has ruined several shirts.  He wonders about using triamcinolone lotion. Asthma: Patient notes no issues with this as long as he avoids dairy and uses his Advair daily.  He has not required the albuterol in some time.   Active Ambulatory Problems    Diagnosis Date Noted  . 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 Microcytic hypochromic anemia 01/08/2014  . Asthma 06/22/2012  . 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 Encounter for general adult medical examination with abnormal findings 05/16/2020  . BRBPR (bright red blood per rectum) 05/16/2020   Resolved Ambulatory Problems    Diagnosis Date Noted  . Cervical lymphadenopathy 04/27/2016  . Mucositis due to antineoplastic therapy 08/25/2016  . Dehydration with hyponatremia 08/25/2016  . Oral bleeding 08/25/2016  . Dysgeusia 08/25/2016  . Insomnia due to medical condition 08/25/2016  . Dehydration 09/03/2016  . Pneumonia 11/10/2016  . Anxiety and depression  11/10/2016  . Neck pain 01/14/2017  . Lesion of soft palate 02/11/2017  . Otitis externa 06/10/2017   Past Medical History:  Diagnosis Date  . Athlete's heart   . Chickenpox     Family History  Problem Relation Age of Onset  . Cancer Father        Skin  . Hyperlipidemia Father   . Hypertension Father        Possible  . Diabetes Paternal Uncle     Social History   Socioeconomic History  . Marital status: Single    Spouse name: Not on file  . Number of children: Not on file  . Years of education: Not on file  . Highest education level: Not on file  Occupational History  . Not on file  Tobacco Use  . Smoking status: Former Smoker    Years: 1.00    Types: Cigarettes  . Smokeless tobacco: Never Used  . Tobacco comment: "YEARS AGO"  Substance and Sexual Activity  . Alcohol use: No    Comment: Occasionally  . Drug use: No  . Sexual activity: Not on file  Other Topics Concern  . Not on file  Social History Narrative  . Not on file   Social Determinants of Health   Financial Resource Strain:   . Difficulty of Paying Living Expenses: Not on file  Food Insecurity:   . Worried About Charity fundraiser in the Last Year: Not on file  . Ran Out of Food in the Last Year: Not on file  Transportation Needs:   . Lack of  Transportation (Medical): Not on file  . Lack of Transportation (Non-Medical): Not on file  Physical Activity:   . Days of Exercise per Week: Not on file  . Minutes of Exercise per Session: Not on file  Stress:   . Feeling of Stress : Not on file  Social Connections:   . Frequency of Communication with Friends and Family: Not on file  . Frequency of Social Gatherings with Friends and Family: Not on file  . Attends Religious Services: Not on file  . Active Member of Clubs or Organizations: Not on file  . Attends Archivist Meetings: Not on file  . Marital Status: Not on file  Intimate Partner Violence:   . Fear of Current or Ex-Partner: Not  on file  . Emotionally Abused: Not on file  . Physically Abused: Not on file  . Sexually Abused: Not on file    ROS  General:  Negative for nexplained weight loss, fever Skin: Negative for new or changing mole, sore that won't heal HEENT: Negative for trouble hearing, trouble seeing, ringing in ears, mouth sores, hoarseness, change in voice, dysphagia. CV:  Negative for chest pain, dyspnea, edema, palpitations Resp: Negative for cough, dyspnea, hemoptysis GI: Negative for nausea, vomiting, diarrhea, constipation, abdominal pain, melena, hematochezia. GU: Negative for dysuria, incontinence, urinary hesitance, hematuria, vaginal or penile discharge, polyuria, sexual difficulty, lumps in testicle or breasts MSK: Negative for muscle cramps or aches, joint pain or swelling Neuro: Negative for headaches, weakness, numbness, dizziness, passing out/fainting Psych: Negative for depression, anxiety, memory problems  Objective  Physical Exam Vitals:   05/16/20 1149  BP: 105/70  Pulse: (!) 54  Temp: 97.9 F (36.6 C)  SpO2: 96%    BP Readings from Last 3 Encounters:  05/16/20 105/70  08/09/18 100/60  07/12/18 123/76   Wt Readings from Last 3 Encounters:  05/16/20 188 lb 3.2 oz (85.4 kg)  08/09/18 189 lb (85.7 kg)  07/12/18 185 lb 9.6 oz (84.2 kg)    Physical Exam Constitutional:      General: He is not in acute distress.    Appearance: He is not diaphoretic.  HENT:     Head: Normocephalic and atraumatic.  Eyes:     Conjunctiva/sclera: Conjunctivae normal.     Pupils: Pupils are equal, round, and reactive to light.  Cardiovascular:     Rate and Rhythm: Normal rate and regular rhythm.     Heart sounds: Normal heart sounds.  Pulmonary:     Effort: Pulmonary effort is normal.     Breath sounds: Normal breath sounds.  Abdominal:     General: Bowel sounds are normal. There is no distension.     Palpations: Abdomen is soft.     Tenderness: There is no abdominal tenderness.  There is no guarding or rebound.  Musculoskeletal:     Right lower leg: No edema.     Left lower leg: No edema.  Lymphadenopathy:     Cervical: No cervical adenopathy.  Skin:    General: Skin is warm and dry.  Neurological:     Mental Status: He is alert.  Psychiatric:        Mood and Affect: Mood normal.      Assessment/Plan:   Encounter for general adult medical examination with abnormal findings Physical exam completed.  Encouraged continued healthy diet and exercise.  Referral for colonoscopy placed.  PSA ordered for prostate cancer screening.  Shingrix and flu vaccine given today.  Advised he would need a second  Shingrix vaccine 2 to 6 months from now.  Hepatitis C screening completed.  Lab work as outlined below.  Testicular hypergonadotropic hypogonadism Patient request recheck of testosterone.  Notes its been sometime since this has been checked.  BRBPR (bright red blood per rectum) Likely hemorrhoidal based on description though we are referring to GI for colon cancer screening.  He will need a colonoscopy.  Asthma Adequately controlled on Advair.  This will be refilled.  Albuterol refilled to have on hand if needed.  Acute radial nerve palsy of left upper extremity This has improved significantly.  He reports his function is pretty much normal.  The only time he notices any deficits is when he is playing his guitar.   Orders Placed This Encounter  Procedures  . Flu Vaccine QUAD 36+ mos IM  . Varicella-zoster vaccine IM (Shingrix)  . Hepatitis C Antibody  . Lipid panel  . Comp Met (CMET)  . TSH  . CBC  . Testosterone  . PSA  . Ambulatory referral to Gastroenterology    Referral Priority:   Routine    Referral Type:   Consultation    Referral Reason:   Specialty Services Required    Number of Visits Requested:   1    Meds ordered this encounter  Medications  . triamcinolone lotion (KENALOG) 0.1 %    Sig: Apply 1 application topically 2 (two) times daily  as needed. For rash    Dispense:  60 mL    Refill:  0  . Fluticasone-Salmeterol (ADVAIR DISKUS) 250-50 MCG/DOSE AEPB    Sig: Inhale 1 puff into the lungs 2 (two) times daily.    Dispense:  1 each    Refill:  3  . albuterol (PROVENTIL HFA) 108 (90 Base) MCG/ACT inhaler    Sig: INHALE 1 PUFF INTO THE LUNGS EVERY 6 HOURS AS NEEDED FOR WHEEZING.    Dispense:  6.7 g    Refill:  0  . valACYclovir (VALTREX) 1000 MG tablet    Sig: TAKE ONE TABLET BY MOUTH 2 TIMES A DAY    Dispense:  20 tablet    Refill:  0    Patient needs follow-up appointment.    This visit occurred during the SARS-CoV-2 public health emergency.  Safety protocols were in place, including screening questions prior to the visit, additional usage of staff PPE, and extensive cleaning of exam room while observing appropriate contact time as indicated for disinfecting solutions.    Tommi Rumps, MD [place]

## 2020-05-16 NOTE — Assessment & Plan Note (Signed)
Physical exam completed.  Encouraged continued healthy diet and exercise.  Referral for colonoscopy placed.  PSA ordered for prostate cancer screening.  Shingrix and flu vaccine given today.  Advised he would need a second Shingrix vaccine 2 to 6 months from now.  Hepatitis C screening completed.  Lab work as outlined below.

## 2020-05-16 NOTE — Assessment & Plan Note (Signed)
This has improved significantly.  He reports his function is pretty much normal.  The only time he notices any deficits is when he is playing his guitar.

## 2020-05-16 NOTE — Addendum Note (Signed)
Addended by: Tor Netters I on: 05/16/2020 03:36 PM   Modules accepted: Orders

## 2020-05-16 NOTE — Patient Instructions (Signed)
Nice to see you. Please continue with diet and exercise. We will get you referred for colonoscopy. We will get lab work today and contact you with the results.

## 2020-05-16 NOTE — Assessment & Plan Note (Signed)
Adequately controlled on Advair.  This will be refilled.  Albuterol refilled to have on hand if needed.

## 2020-05-16 NOTE — Assessment & Plan Note (Signed)
Patient request recheck of testosterone.  Notes its been sometime since this has been checked.

## 2020-05-16 NOTE — Addendum Note (Signed)
Addended by: Tor Netters I on: 05/16/2020 03:38 PM   Modules accepted: Orders

## 2020-05-16 NOTE — Assessment & Plan Note (Signed)
Likely hemorrhoidal based on description though we are referring to GI for colon cancer screening.  He will need a colonoscopy.

## 2020-05-17 ENCOUNTER — Encounter: Payer: Self-pay | Admitting: Family Medicine

## 2020-05-17 LAB — PSA: PSA: 0.48 ng/mL (ref <=4.0)

## 2020-05-17 LAB — COMPREHENSIVE METABOLIC PANEL WITH GFR
AG Ratio: 1.6 (calc) (ref 1.0–2.5)
ALT: 14 U/L (ref 9–46)
AST: 20 U/L (ref 10–35)
Albumin: 4.3 g/dL (ref 3.6–5.1)
Alkaline phosphatase (APISO): 63 U/L (ref 35–144)
BUN/Creatinine Ratio: 23 (calc) — ABNORMAL HIGH (ref 6–22)
BUN: 27 mg/dL — ABNORMAL HIGH (ref 7–25)
CO2: 21 mmol/L (ref 20–32)
Calcium: 10 mg/dL (ref 8.6–10.3)
Chloride: 107 mmol/L (ref 98–110)
Creat: 1.17 mg/dL (ref 0.70–1.33)
Globulin: 2.7 g/dL (ref 1.9–3.7)
Glucose, Bld: 85 mg/dL (ref 65–99)
Potassium: 4.4 mmol/L (ref 3.5–5.3)
Sodium: 144 mmol/L (ref 135–146)
Total Bilirubin: 0.4 mg/dL (ref 0.2–1.2)
Total Protein: 7 g/dL (ref 6.1–8.1)

## 2020-05-17 LAB — LIPID PANEL
Cholesterol: 165 mg/dL (ref <200)
HDL: 52 mg/dL (ref >=40)
LDL Cholesterol (Calc): 98 mg/dL (calc)
Non-HDL Cholesterol (Calc): 113 mg/dL (calc) (ref <130)
Total CHOL/HDL Ratio: 3.2 (calc) (ref <5.0)
Triglycerides: 67 mg/dL (ref <150)

## 2020-05-17 LAB — TESTOSTERONE: Testosterone: 311 ng/dL (ref 250–827)

## 2020-05-17 LAB — TSH: TSH: 2.68 mIU/L (ref 0.40–4.50)

## 2020-05-19 LAB — HEPATITIS C ANTIBODY
Hepatitis C Ab: NONREACTIVE
SIGNAL TO CUT-OFF: 0.01 (ref <1.00)

## 2020-08-01 ENCOUNTER — Telehealth: Payer: Self-pay

## 2020-08-01 NOTE — Telephone Encounter (Signed)
Return patients call. Informed patient his appointment will be in person. Pt verbalized understanding.

## 2020-08-06 ENCOUNTER — Ambulatory Visit (INDEPENDENT_AMBULATORY_CARE_PROVIDER_SITE_OTHER): Payer: 59 | Admitting: Gastroenterology

## 2020-08-06 ENCOUNTER — Encounter: Payer: Self-pay | Admitting: Gastroenterology

## 2020-08-06 ENCOUNTER — Other Ambulatory Visit: Payer: Self-pay

## 2020-08-06 VITALS — BP 125/78 | HR 55 | Temp 98.1°F | Ht 73.0 in | Wt 196.0 lb

## 2020-08-06 DIAGNOSIS — K625 Hemorrhage of anus and rectum: Secondary | ICD-10-CM | POA: Diagnosis not present

## 2020-08-06 DIAGNOSIS — Z1211 Encounter for screening for malignant neoplasm of colon: Secondary | ICD-10-CM

## 2020-08-07 NOTE — Progress Notes (Signed)
Philip Cordova [place], [place] [address]  Main: [phone]  Fax: [phone]   Gastroenterology Consultation  Referring Provider:     [name], MD Primary Care Physician:  [name], MD Reason for Consultation:     Bright red blood per rectum        HPI:    Chief Complaint  Patient presents with  . Hematochezia    Philip Cordova is a 54 y.o. y/o male referred for consultation & management  by Dr. [name], [name], MD.  Patient reports intermittent bright blood per rectum for years, and also describes a prolapsed hemorrhoid that he has to push back in from time to time.  This is nonpainful.  Eats a very high-fiber diet but does sometimes strain with his bowel movements.  No prior colonoscopy or no family history of colon cancer.  No nausea or vomiting.  No abdominal pain.  No dysphagia or heartburn.  Past Medical History:  Diagnosis Date  . Asthma   . 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 Chickenpox   . 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     Past Surgical History:  Procedure Laterality Date  . APPENDECTOMY  2008  . DIAGNOSTIC LARYNGOSCOPY  06/17/2016   Procedure: DIAGNOSTIC LARYNGOSCOPY WITH BIOPSIES;  Surgeon: Carloyn Manner, MD;  Location: [facility] ORS;  Service: ENT;;  . Moles removed  2005  . RHINOPLASTY  1990  . TONSILLECTOMY N/A 06/17/2016   Procedure: TONSILLECTOMY;  Surgeon: Carloyn Manner, MD;  Location: [facility] ORS;  Service: ENT;  Laterality: N/A;    Prior to Admission medications   Medication Sig Start Date End Date Taking? Authorizing Provider  albuterol (PROVENTIL HFA) 108 (90 Base) MCG/ACT inhaler INHALE 1 PUFF INTO THE LUNGS EVERY 6 HOURS AS NEEDED FOR WHEEZING. 05/16/20  Yes [name], MD  Fluticasone-Salmeterol (ADVAIR DISKUS) 250-50 MCG/DOSE AEPB Inhale 1 puff into the lungs 2 (two) times daily. 05/16/20  Yes  [name], MD  Multiple Vitamins-Minerals (MULTIVITAMIN WITH MINERALS) tablet Take by mouth. 11/21/08  Yes [provider]  neomycin-polymyxin-hydrocortisone (CORTISPORIN) OTIC solution Place 4 drops into both ears 3 (three) times daily. 08/09/18  Yes Guse, [name], FNP  Omega-3 Fatty Acids (FISH OIL) 1000 MG CAPS Take 1 capsule by mouth daily.    Yes [provider]  triamcinolone lotion (KENALOG) 0.1 % Apply 1 application topically 2 (two) times daily as needed. For rash 05/16/20  Yes [name], MD  valACYclovir (VALTREX) 1000 MG tablet TAKE ONE TABLET BY MOUTH 2 TIMES A DAY 05/16/20  Yes [name], MD  zolpidem (AMBIEN) 5 MG tablet Take 1 tablet (5 mg total) by mouth at bedtime as needed. for sleep 10/23/16  Yes [name], MD    Family History  Problem Relation Age of Onset  . Cancer Father        Skin  . Hyperlipidemia Father   . Hypertension Father        Possible  . Diabetes Paternal Uncle      Social History   Tobacco Use  . Smoking status: Former Smoker    Years: 1.00    Types: Cigarettes  . Smokeless tobacco: Never Used  . Tobacco comment: "YEARS AGO"  Substance Use Topics  . Alcohol use: No    Comment: Occasionally  . Drug use: No  Allergies as of 08/06/2020 - Review Complete 08/06/2020  Allergen Reaction Noted  . Milk-related compounds  03/23/2012    Review of Systems:    All systems reviewed and negative except where noted in HPI.   Physical Exam:  BP 125/78   Pulse (!) 55   Temp 98.1 F (36.7 C) (Oral)   Ht 6\' 1"  (1.854 m)   Wt 196 lb (88.9 kg)   BMI 25.86 kg/m  No LMP for male patient. Psych:  Alert and cooperative. Normal mood and affect. General:   Alert,  Well-developed, well-nourished, pleasant and cooperative in NAD Head:  Normocephalic and atraumatic. Eyes:  Sclera clear, no icterus.   Conjunctiva pink. Ears:  Normal auditory acuity. Nose:  No deformity, discharge, or lesions. Mouth:  No  deformity or lesions,oropharynx pink & moist. Neck:  Supple; no masses or thyromegaly. Abdomen:  Normal bowel sounds.  No bruits.  Soft, non-tender and non-distended without masses, hepatosplenomegaly or hernias noted.  No guarding or rebound tenderness.    Msk:  Symmetrical without gross deformities. Good, equal movement & strength bilaterally. Pulses:  Normal pulses noted. Extremities:  No clubbing or edema.  No cyanosis. Neurologic:  Alert and oriented x3;  grossly normal neurologically. Skin:  Intact without significant lesions or rashes. No jaundice. Lymph Nodes:  No significant cervical adenopathy. Psych:  Alert and cooperative. Normal mood and affect.   Labs: CBC    Component Value Date/Time   WBC 5.2 03/30/2018 0846   RBC 4.26 (L) 03/30/2018 0846   HGB 13.6 03/30/2018 0846   HGB 15.4 04/14/2016 1010   HCT 39.6 (L) 03/30/2018 0846   HCT 45.9 04/14/2016 1010   HCT 44 09/23/2015 0000   PLT 259 03/30/2018 0846   PLT 327 04/14/2016 1010   MCV 92.9 03/30/2018 0846   MCV 93 04/14/2016 1010   MCH 32.0 03/30/2018 0846   MCHC 34.4 03/30/2018 0846   RDW 13.0 03/30/2018 0846   RDW 13.5 04/14/2016 1010   LYMPHSABS 0.8 (L) 03/30/2018 0846   LYMPHSABS 1.8 04/14/2016 1010   MONOABS 0.6 03/30/2018 0846   EOSABS 0.7 03/30/2018 0846   EOSABS 0.2 04/14/2016 1010   BASOSABS 0.1 03/30/2018 0846   BASOSABS 0.0 04/14/2016 1010   CMP     Component Value Date/Time   NA 144 05/16/2020 1537   NA 140 04/14/2016 1010   K 4.4 05/16/2020 1537   CL 107 05/16/2020 1537   CO2 21 05/16/2020 1537   GLUCOSE 85 05/16/2020 1537   BUN 27 (H) 05/16/2020 1537   BUN 19 04/14/2016 1010   CREATININE 1.17 05/16/2020 1537   CALCIUM 10.0 05/16/2020 1537   PROT 7.0 05/16/2020 1537   PROT 7.1 04/14/2016 1010   ALBUMIN 4.2 03/30/2018 0846   ALBUMIN 4.3 04/14/2016 1010   AST 20 05/16/2020 1537   ALT 14 05/16/2020 1537   ALKPHOS 52 03/30/2018 0846   BILITOT 0.4 05/16/2020 1537   BILITOT 0.8 04/14/2016  1010   GFRNONAA >60 03/30/2018 0846   GFRAA >60 03/30/2018 0846    Imaging Studies: No results found.  Assessment and Plan:   [name] is a 54 y.o. y/o male has been referred for prior blood per rectum  Patient is past due for screening colonoscopy In addition, with his bright red blood per rectum it is important to rule out malignancy  However, he is describing what may be a prolapsing internal hemorrhoid, however colonoscopy would allow to confirm this and evaluate the size of this and  see if he would benefit from referral for banding  I have discussed alternative options, risks & benefits,  which include, but are not limited to, bleeding, infection, perforation,respiratory complication & drug reaction.  The patient agrees with this plan & written consent will be obtained.    Continue high-fiber diet    Dr Philip Cordova  Speech recognition software was used to dictate the above note.

## 2020-08-11 ENCOUNTER — Encounter: Payer: Self-pay | Admitting: Dermatology

## 2020-08-11 ENCOUNTER — Other Ambulatory Visit: Payer: Self-pay

## 2020-08-11 ENCOUNTER — Ambulatory Visit (INDEPENDENT_AMBULATORY_CARE_PROVIDER_SITE_OTHER): Payer: 59 | Admitting: Dermatology

## 2020-08-11 DIAGNOSIS — D229 Melanocytic nevi, unspecified: Secondary | ICD-10-CM

## 2020-08-11 DIAGNOSIS — L308 Other specified dermatitis: Secondary | ICD-10-CM

## 2020-08-11 DIAGNOSIS — Z85828 Personal history of other malignant neoplasm of skin: Secondary | ICD-10-CM

## 2020-08-11 DIAGNOSIS — D1800 Hemangioma unspecified site: Secondary | ICD-10-CM

## 2020-08-11 DIAGNOSIS — L821 Other seborrheic keratosis: Secondary | ICD-10-CM

## 2020-08-11 DIAGNOSIS — L578 Other skin changes due to chronic exposure to nonionizing radiation: Secondary | ICD-10-CM

## 2020-08-11 DIAGNOSIS — L570 Actinic keratosis: Secondary | ICD-10-CM

## 2020-08-11 DIAGNOSIS — L814 Other melanin hyperpigmentation: Secondary | ICD-10-CM | POA: Diagnosis not present

## 2020-08-11 DIAGNOSIS — Z1283 Encounter for screening for malignant neoplasm of skin: Secondary | ICD-10-CM | POA: Diagnosis not present

## 2020-08-11 NOTE — Progress Notes (Signed)
Follow-Up Visit   Subjective  Philip Cordova is a 54 y.o. 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 The patient presents for Total-Body Skin Exam (TBSE) for skin cancer screening and mole check.  The following portions of the chart were reviewed this encounter and updated as appropriate:   Tobacco  Allergies  Meds  Problems  Med Hx  Surg Hx  Fam Hx     Review of Systems:  No other skin or systemic complaints except as noted in HPI or Assessment and Plan.  Objective  Well appearing patient in no apparent distress; mood and affect are within normal limits.  A full examination was performed including scalp, head, eyes, ears, nose, lips, neck, chest, axillae, abdomen, back, buttocks, bilateral upper extremities, bilateral lower extremities, hands, feet, fingers, toes, fingernails, and toenails. All findings within normal limits unless otherwise noted below.  Objective  Right Breast: Clear skin   Objective  face (7): Erythematous thin papules/macules with gritty scale.   Objective  neck,throat: Well healed scar with no evidence of recurrence, no lymphadenopathy.    Assessment & Plan  Other eczema /atopic dermatitis Trunk Chronic and persistent -exacerbated by frequent showers the patient takes after exercising three times a day.-currently mostly clear D/C Triamcinolone ointment use prn flares only  Start otc Cerave moisturizer daily   Atopic dermatitis (eczema) is a chronic, relapsing, pruritic condition that can significantly affect quality of life. It is often associated with allergic rhinitis and/or asthma and can require treatment with topical medications, phototherapy, or in severe cases a biologic medication called Dupixent in older children and adults.   Topical steroids (such as triamcinolone, fluocinolone, fluocinonide,  mometasone, clobetasol, halobetasol, betamethasone, hydrocortisone) can cause thinning and lightening of the skin if they are used for too long in the same area. Your physician has selected the right strength medicine for your problem and area affected on the body. Please use your medication only as directed by your physician to prevent side effects.   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 Observe for recurrence. Call clinic for new or changing lesions.  Recommend regular skin exams, daily broad-spectrum spf 30+ sunscreen use, and photoprotection.     Skin cancer screening   Lentigines - Scattered tan macules - Discussed due to sun exposure - Benign, observe - Call for any changes  Seborrheic Keratoses - Stuck-on, waxy, tan-brown papules and plaques  - Discussed benign etiology and prognosis. - Observe - Call for any changes  Melanocytic Nevi - Tan-brown and/or pink-flesh-colored symmetric macules and papules - Benign appearing on exam today - Observation - Call clinic for new or changing moles - Recommend daily use of broad spectrum spf 30+ sunscreen to sun-exposed areas.   Hemangiomas - Red papules - Discussed benign nature - Observe - Call for  any changes  Actinic Damage - Chronic, secondary to cumulative UV/sun exposure - diffuse scaly erythematous macules with  underlying dyspigmentation - Recommend daily broad spectrum sunscreen SPF 30+ to sun-exposed areas, reapply every 2 hours as needed.  - Call for new or changing lesions.  Skin cancer screening performed today.  Return in about 1 year (around 08/11/2021).  IMarye Round, CMA, am acting as scribe for Sarina Ser, MD .  Documentation: I have reviewed the above documentation for accuracy and completeness, and I agree with the above.  Sarina Ser, MD

## 2020-08-12 ENCOUNTER — Other Ambulatory Visit
Admission: RE | Admit: 2020-08-12 | Discharge: 2020-08-12 | Disposition: A | Discharge status: Home / Self Care | Payer: 59 | Source: Ambulatory Visit | Attending: Gastroenterology | Admitting: Gastroenterology

## 2020-08-12 DIAGNOSIS — Z20822 Contact with and (suspected) exposure to covid-19: Secondary | ICD-10-CM | POA: Insufficient documentation

## 2020-08-12 DIAGNOSIS — Z01812 Encounter for preprocedural laboratory examination: Secondary | ICD-10-CM | POA: Diagnosis present

## 2020-08-12 LAB — SARS CORONAVIRUS 2 (TAT 6-24 HRS): SARS Coronavirus 2: NEGATIVE

## 2020-08-13 ENCOUNTER — Encounter: Payer: Self-pay | Admitting: Gastroenterology

## 2020-08-13 ENCOUNTER — Other Ambulatory Visit: Payer: Self-pay

## 2020-08-13 MED ORDER — NA SULFATE-K SULFATE-MG SULF 17.5-3.13-1.6 GM/177ML PO SOLN: ORAL | 0 refills | Status: DC

## 2020-08-14 ENCOUNTER — Ambulatory Visit: Payer: 59 | Admitting: Anesthesiology

## 2020-08-14 ENCOUNTER — Encounter
Admission: RE | Disposition: A | Discharge status: Home / Self Care | Payer: Self-pay | Source: Home / Self Care | Attending: Gastroenterology

## 2020-08-14 ENCOUNTER — Ambulatory Visit
Admission: RE | Admit: 2020-08-14 | Discharge: 2020-08-14 | Disposition: A | Discharge status: Home / Self Care | Payer: 59 | Attending: Gastroenterology | Admitting: Gastroenterology

## 2020-08-14 ENCOUNTER — Encounter: Payer: Self-pay | Admitting: Gastroenterology

## 2020-08-14 ENCOUNTER — Encounter: Payer: Self-pay | Admitting: Dermatology

## 2020-08-14 ENCOUNTER — Telehealth: Payer: Self-pay | Admitting: Gastroenterology

## 2020-08-14 ENCOUNTER — Other Ambulatory Visit: Payer: Self-pay

## 2020-08-14 DIAGNOSIS — K621 Rectal polyp: Secondary | ICD-10-CM

## 2020-08-14 DIAGNOSIS — Z7951 Long term (current) use of inhaled steroids: Secondary | ICD-10-CM | POA: Diagnosis not present

## 2020-08-14 DIAGNOSIS — Z87891 Personal history of nicotine dependence: Secondary | ICD-10-CM | POA: Insufficient documentation

## 2020-08-14 DIAGNOSIS — Z1211 Encounter for screening for malignant neoplasm of colon: Secondary | ICD-10-CM

## 2020-08-14 DIAGNOSIS — Z79899 Other long term (current) drug therapy: Secondary | ICD-10-CM | POA: Diagnosis not present

## 2020-08-14 DIAGNOSIS — K5730 Diverticulosis of large intestine without perforation or abscess without bleeding: Secondary | ICD-10-CM | POA: Diagnosis not present

## 2020-08-14 DIAGNOSIS — K648 Other hemorrhoids: Secondary | ICD-10-CM | POA: Diagnosis not present

## 2020-08-14 DIAGNOSIS — Z8619 Personal history of other infectious and parasitic diseases: Secondary | ICD-10-CM | POA: Diagnosis not present

## 2020-08-14 DIAGNOSIS — Z85828 Personal history of other malignant neoplasm of skin: Secondary | ICD-10-CM | POA: Insufficient documentation

## 2020-08-14 DIAGNOSIS — K635 Polyp of colon: Secondary | ICD-10-CM | POA: Diagnosis not present

## 2020-08-14 HISTORY — PX: COLONOSCOPY WITH PROPOFOL: SHX5780

## 2020-08-14 SURGERY — COLONOSCOPY WITH PROPOFOL
Anesthesia: General

## 2020-08-14 MED ORDER — PROPOFOL 500 MG/50ML IV EMUL
INTRAVENOUS | Status: AC
  Filled 2020-08-14: qty 50

## 2020-08-14 MED ORDER — HYDROCORTISONE ACETATE 25 MG RE SUPP: 25.0000 mg | Freq: Every day | RECTAL | 0 refills | Status: DC

## 2020-08-14 MED ORDER — PROPOFOL 10 MG/ML IV BOLUS
INTRAVENOUS | Status: DC | PRN
  Administered 2020-08-14: 150 mg via INTRAVENOUS

## 2020-08-14 MED ORDER — LIDOCAINE HCL (CARDIAC) PF 100 MG/5ML IV SOSY
PREFILLED_SYRINGE | INTRAVENOUS | Status: DC | PRN
  Administered 2020-08-14: 100 mg via INTRAVENOUS

## 2020-08-14 MED ORDER — PROPOFOL 500 MG/50ML IV EMUL
INTRAVENOUS | Status: DC | PRN
  Administered 2020-08-14: 150 ug/kg/min via INTRAVENOUS

## 2020-08-14 MED ORDER — SODIUM CHLORIDE 0.9 % IV SOLN: INTRAVENOUS | Status: DC

## 2020-08-14 NOTE — H&P (Signed)
Vonda Antigua, MD [address], [place], [place], [place], [address] [address], [place], [place], [place], [address] Phone: [phone]  Fax: [phone]  Primary Care Physician:  [name], MD   Pre-Procedure History & Physical: HPI:  Philip Cordova is a 54 y.o. male is here for a colonoscopy.   Past Medical History:  Diagnosis Date  . Asthma   . 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 Chickenpox   . 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     Past Surgical History:  Procedure Laterality Date  . APPENDECTOMY  2008  . DIAGNOSTIC LARYNGOSCOPY  06/17/2016   Procedure: DIAGNOSTIC LARYNGOSCOPY WITH BIOPSIES;  Surgeon: Carloyn Manner, MD;  Location: [facility] ORS;  Service: ENT;;  . Moles removed  2005  . RHINOPLASTY  1990  . TONSILLECTOMY N/A 06/17/2016   Procedure: TONSILLECTOMY;  Surgeon: Carloyn Manner, MD;  Location: [facility] ORS;  Service: ENT;  Laterality: N/A;  . TONSILLECTOMY      Prior to Admission medications   Medication Sig Start Date End Date Taking? Authorizing Provider  albuterol (PROVENTIL HFA) 108 (90 Base) MCG/ACT inhaler INHALE 1 PUFF INTO THE LUNGS EVERY 6 HOURS AS NEEDED FOR WHEEZING. 05/16/20  Yes [name], MD  Fluticasone-Salmeterol (ADVAIR DISKUS) 250-50 MCG/DOSE AEPB Inhale 1 puff into the lungs 2 (two) times daily. 05/16/20   [name], MD  Multiple Vitamins-Minerals (MULTIVITAMIN WITH MINERALS) tablet Take by mouth. 11/21/08   [provider]  Na Sulfate-K Sulfate-Mg Sulf 17.5-3.13-1.6 GM/177ML SOLN At 5 PM the day before procedure take 1 bottle and 5 hours before procedure take 1 bottle. 08/13/20   Virgel Manifold, MD  neomycin-polymyxin-hydrocortisone (CORTISPORIN) OTIC solution Place 4 drops into both ears 3 (three) times daily. 08/09/18   Jodelle Green, FNP  Omega-3 Fatty Acids (FISH OIL) 1000 MG CAPS Take 1 capsule by mouth daily.      [provider]  triamcinolone lotion (KENALOG) 0.1 % Apply 1 application topically 2 (two) times daily as needed. For rash 05/16/20   [name], MD  valACYclovir (VALTREX) 1000 MG tablet TAKE ONE TABLET BY MOUTH 2 TIMES A DAY 05/16/20   [name], MD  zolpidem (AMBIEN) 5 MG tablet Take 1 tablet (5 mg total) by mouth at bedtime as needed. for sleep 10/23/16   [name], MD    Allergies as of 08/06/2020 - Review Complete 08/06/2020  Allergen Reaction Noted  . Milk-related compounds  03/23/2012    Family History  Problem Relation Age of Onset  . Cancer Father        Skin  . Hyperlipidemia Father   . Hypertension Father        Possible  . Diabetes Paternal Uncle     Social History   Socioeconomic History  . Marital status: Single    Spouse name: Not on file  . Number of children: Not on file  . Years of education: Not on file  . Highest education level: Not on file  Occupational History  . Not on file  Tobacco Use  . Smoking status: Former Smoker    Years: 1.00    Types: Cigarettes  . Smokeless tobacco: Never Used  . Tobacco comment: "YEARS AGO"  Vaping Use  . Vaping Use: Never used  Substance and Sexual Activity  . Alcohol use: No    Comment: Occasionally  . Drug  use: No  . Sexual activity: Not on file  Other Topics Concern  . Not on file  Social History Narrative  . Not on file   Social Determinants of Health   Financial Resource Strain: Not on file  Food Insecurity: Not on file  Transportation Needs: Not on file  Physical Activity: Not on file  Stress: Not on file  Social Connections: Not on file  Intimate Partner Violence: Not on file    Review of Systems: See HPI, otherwise negative ROS  Physical Exam: There were no vitals taken for this visit. General:   Alert,  pleasant and cooperative in NAD Head:  Normocephalic and atraumatic. Neck:  Supple; no masses or thyromegaly. Lungs:  Clear throughout to auscultation,  normal respiratory effort.    Heart:  +S1, +S2, Regular rate and rhythm, No edema. Abdomen:  Soft, nontender and nondistended. Normal bowel sounds, without guarding, and without rebound.   Neurologic:  Alert and  oriented x4;  grossly normal neurologically.  Impression/Plan: Philip Cordova is here for a colonoscopy to be performed for average risk screening.  Risks, benefits, limitations, and alternatives regarding  colonoscopy have been reviewed with the patient.  Questions have been answered.  All parties agreeable.   Virgel Manifold, MD  08/14/2020, 9:49 AM

## 2020-08-14 NOTE — Op Note (Signed)
[facility] Gastroenterology Patient Name: Dontre Laduca Procedure Date: 08/14/2020 10:48 AM MRN: [id number] Account #: [id number] Date of Birth: 1965/09/03 Admit Type: Outpatient Age: 54 Room: [facility] ENDO ROOM 2 Gender: Male Note Status: Finalized Procedure:             Colonoscopy Indications:           Screening for colorectal malignant neoplasm Providers:             Lyndell Allaire B. Bonna Gains MD, MD Referring MD:          [name]. [name] (Referring MD) Medicines:             Monitored Anesthesia Care Complications:         No immediate complications. Procedure:             Pre-Anesthesia Assessment:                        - ASA Grade Assessment: II - A patient with mild                         systemic disease.                        - Prior to the procedure, a History and Physical was                         performed, and patient medications, allergies and                         sensitivities were reviewed. The patient's tolerance                         of previous anesthesia was reviewed.                        - The risks and benefits of the procedure and the                         sedation options and risks were discussed with the                         patient. All questions were answered and informed                         consent was obtained.                        - Patient identification and proposed procedure were                         verified prior to the procedure by the physician, the                         nurse, the anesthesiologist, the anesthetist and the                         technician. The procedure was verified in the                         procedure room.  After obtaining informed consent, the colonoscope was                         passed under direct vision. Throughout the procedure,                         the patient's blood pressure, pulse, and oxygen                         saturations were monitored  continuously. The                         Colonoscope was introduced through the anus and                         advanced to the the cecum, identified by appendiceal                         orifice and ileocecal valve. The colonoscopy was                         performed with ease. The patient tolerated the                         procedure well. The quality of the bowel preparation                         was good. Findings:      The perianal and digital rectal examinations were normal.      Three flat and sessile polyps were found in the sigmoid colon and       ascending colon. The polyps were 5 to 8 mm in size. These polyps were       removed with a cold snare. Resection and retrieval were complete.      A 4 mm polyp was found in the rectum. The polyp was sessile. The polyp       was removed with a cold biopsy forceps. Resection and retrieval were       complete.      A single diverticulum was found in the ascending colon.      The exam was otherwise without abnormality.      The rectum, sigmoid colon, descending colon, transverse colon, ascending       colon and cecum appeared normal.      Non-bleeding internal hemorrhoids were found during retroflexion. The       hemorrhoids were large. Impression:            - Three 5 to 8 mm polyps in the sigmoid colon and in                         the ascending colon, removed with a cold snare.                         Resected and retrieved.                        - One 4 mm polyp in the rectum, removed with a cold  biopsy forceps. Resected and retrieved.                        - Diverticulosis in the ascending colon.                        - The examination was otherwise normal.                        - The rectum, sigmoid colon, descending colon,                         transverse colon, ascending colon and cecum are normal.                        - Non-bleeding internal hemorrhoids. Recommendation:        - Use  hydrocortisone suppository 25 mg 1 per rectum                         once a day for 10 days.                        - Discharge patient to home (with escort).                        - Advance diet as tolerated.                        - Continue present medications.                        - Await pathology results.                        - Repeat colonoscopy date to be determined after                         pending pathology results are reviewed.                        - The findings and recommendations were discussed with                         the patient.                        - The findings and recommendations were discussed with                         the patient's family.                        - Return to primary care physician as previously                         scheduled.                        - High fiber diet. Procedure Code(s):     --- Professional ---  45385, Colonoscopy, flexible; with removal of                         tumor(s), polyp(s), or other lesion(s) by snare                         technique                        45380, 59, Colonoscopy, flexible; with biopsy, single                         or multiple Diagnosis Code(s):     --- Professional ---                        Z12.11, Encounter for screening for malignant neoplasm                         of colon                        K63.5, Polyp of colon                        K62.1, Rectal polyp CPT copyright 2019 American Medical Association. All rights reserved. The codes documented in this report are preliminary and upon coder review may  be revised to meet current compliance requirements.  Vonda Antigua, MD Margretta Sidle B. Bonna Gains MD, MD 08/14/2020 11:31:18 AM This report has been signed electronically. Number of Addenda: 0 Note Initiated On: 08/14/2020 10:48 AM Scope Withdrawal Time: 0 hours 19 minutes 5 seconds  Total Procedure Duration: 0 hours 25 minutes 30 seconds   Estimated Blood Loss:  Estimated blood loss: none.      [facility]

## 2020-08-14 NOTE — Transfer of Care (Signed)
Immediate Anesthesia Transfer of Care Note  Patient: Philip Cordova  Procedure(s) Performed: COLONOSCOPY WITH PROPOFOL (N/A )  Patient Location: PACU and Endoscopy Unit  Anesthesia Type:General  Level of Consciousness: alert , oriented and drowsy  Airway & Oxygen Therapy: Patient Spontanous Breathing  Post-op Assessment: Report given to RN and Post -op Vital signs reviewed and stable  Post vital signs: Reviewed and stable  Last Vitals:  Vitals Value Taken Time  BP 104/63 08/14/20 1134  Temp 36.1 C 08/14/20 1130  Pulse 53 08/14/20 1134  Resp 22 08/14/20 1134  SpO2 99 % 08/14/20 1134    Last Pain:  Vitals:   08/14/20 1130  TempSrc: Temporal  PainSc: 0-No pain         Complications: No complications documented.

## 2020-08-14 NOTE — Telephone Encounter (Signed)
Pt prefers Paediatric nurse on [employer]

## 2020-08-14 NOTE — Anesthesia Postprocedure Evaluation (Signed)
Anesthesia Post Note  Patient: Sales promotion account executive  Procedure(s) Performed: COLONOSCOPY WITH PROPOFOL (N/A )  Patient location during evaluation: Endoscopy Anesthesia Type: General Level of consciousness: awake and alert and oriented Pain management: pain level controlled Vital Signs Assessment: post-procedure vital signs reviewed and stable Respiratory status: spontaneous breathing Cardiovascular status: blood pressure returned to baseline Anesthetic complications: no   No complications documented.   Last Vitals:  Vitals:   08/14/20 1140 08/14/20 1150  BP: (!) 90/50 106/70  Pulse:    Resp:    Temp:    SpO2:      Last Pain:  Vitals:   08/14/20 1150  TempSrc:   PainSc: 0-No pain                 Versa Craton

## 2020-08-14 NOTE — Anesthesia Preprocedure Evaluation (Signed)
Anesthesia Evaluation  Patient identified by MRN, date of birth, ID band Patient awake    Reviewed: Allergy & Precautions, H&P , NPO status , Patient's Chart, lab work & pertinent test results  History of Anesthesia Complications Negative for: history of anesthetic complications  Airway Mallampati: II  TM Distance: >3 FB Neck ROM: full    Dental no notable dental hx. (+) Teeth Intact   Pulmonary neg shortness of breath, asthma , former smoker,    Pulmonary exam normal breath sounds clear to auscultation       Cardiovascular Exercise Tolerance: Good (-) angina(-) Past MI and (-) DOE negative cardio ROS Normal cardiovascular exam Rhythm:regular Rate:Normal     Neuro/Psych  Neuromuscular disease negative psych ROS   GI/Hepatic negative GI ROS, Neg liver ROS, neg GERD  ,  Endo/Other  negative endocrine ROS  Renal/GU      Musculoskeletal negative musculoskeletal ROS (+)   Abdominal   Peds negative pediatric ROS (+)  Hematology  (+) anemia ,   Anesthesia Other Findings Past Medical History: No date: Asthma 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     Comment: STATES HR STAYS AROUND 48 No date: Cancer [facility])  Past Surgical History: 2008: APPENDECTOMY 2005: Moles removed 1990: RHINOPLASTY     Reproductive/Obstetrics negative OB ROS                             Anesthesia Physical  Anesthesia Plan  ASA: III  Anesthesia Plan: General   Post-op Pain Management:    Induction: Intravenous  PONV Risk Score and Plan: Propofol infusion  Airway Management Planned: Nasal Cannula  Additional Equipment:   Intra-op Plan:   Post-operative Plan:   Informed Consent: I have reviewed the patients History and Physical, chart, labs and discussed the procedure including the risks, benefits and alternatives for the proposed anesthesia with the patient or authorized representative who has indicated his/her  understanding and acceptance.     Dental advisory given  Plan Discussed with: Anesthesiologist, CRNA and Surgeon  Anesthesia Plan Comments:         Anesthesia Quick Evaluation

## 2020-08-14 NOTE — Telephone Encounter (Signed)
Pharmacy was changed per patient's request.

## 2020-08-15 LAB — SURGICAL PATHOLOGY

## 2020-08-18 ENCOUNTER — Encounter: Payer: Self-pay | Admitting: Gastroenterology

## 2020-08-18 ENCOUNTER — Telehealth: Payer: Self-pay | Admitting: Family Medicine

## 2020-08-18 DIAGNOSIS — F439 Reaction to severe stress, unspecified: Secondary | ICD-10-CM

## 2020-08-18 NOTE — Telephone Encounter (Signed)
Patient called in wanted to know if Dr [name] knew of a male therapist online that he could refer him to

## 2020-08-19 NOTE — Telephone Encounter (Signed)
I called and spoke with the patient and he stated it was nothing suicidal its just some things going on in his life and he has had therapy before, he is okay with a Engineering geologist and a virtual visit.  Prefers a male.  Dakayla Disanti,cma

## 2020-08-19 NOTE — Telephone Encounter (Signed)
We could certainly refer him to one of the [facility] therapists who are doing virtual visits currently.  Please find out why he wants a referral.  Thanks.

## 2020-08-20 NOTE — Telephone Encounter (Signed)
Referral placed.

## 2020-08-28 ENCOUNTER — Other Ambulatory Visit: Payer: Self-pay | Admitting: Family Medicine

## 2020-08-31 ENCOUNTER — Other Ambulatory Visit: Payer: Self-pay | Admitting: Gastroenterology

## 2020-09-15 ENCOUNTER — Ambulatory Visit: Payer: 59 | Admitting: Psychology

## 2020-09-15 ENCOUNTER — Ambulatory Visit (INDEPENDENT_AMBULATORY_CARE_PROVIDER_SITE_OTHER): Payer: 59 | Admitting: Psychology

## 2020-09-15 DIAGNOSIS — F4322 Adjustment disorder with anxiety: Secondary | ICD-10-CM

## 2020-09-17 ENCOUNTER — Ambulatory Visit (INDEPENDENT_AMBULATORY_CARE_PROVIDER_SITE_OTHER): Payer: 59 | Admitting: Psychology

## 2020-09-17 DIAGNOSIS — F4322 Adjustment disorder with anxiety: Secondary | ICD-10-CM | POA: Diagnosis not present

## 2020-09-25 ENCOUNTER — Ambulatory Visit (INDEPENDENT_AMBULATORY_CARE_PROVIDER_SITE_OTHER): Payer: 59 | Admitting: Psychology

## 2020-09-25 DIAGNOSIS — F4322 Adjustment disorder with anxiety: Secondary | ICD-10-CM

## 2020-09-29 ENCOUNTER — Ambulatory Visit (INDEPENDENT_AMBULATORY_CARE_PROVIDER_SITE_OTHER): Payer: 59 | Admitting: Psychology

## 2020-09-29 DIAGNOSIS — F4322 Adjustment disorder with anxiety: Secondary | ICD-10-CM

## 2020-10-06 ENCOUNTER — Ambulatory Visit (INDEPENDENT_AMBULATORY_CARE_PROVIDER_SITE_OTHER): Payer: 59 | Admitting: Psychology

## 2020-10-06 DIAGNOSIS — F4322 Adjustment disorder with anxiety: Secondary | ICD-10-CM

## 2020-10-22 ENCOUNTER — Ambulatory Visit (INDEPENDENT_AMBULATORY_CARE_PROVIDER_SITE_OTHER): Payer: 59 | Admitting: Psychology

## 2020-10-22 DIAGNOSIS — F4322 Adjustment disorder with anxiety: Secondary | ICD-10-CM

## 2020-10-29 ENCOUNTER — Telehealth: Payer: Self-pay | Admitting: Gastroenterology

## 2020-10-29 NOTE — Telephone Encounter (Signed)
Patient came by to ask questions about diet since he has Diverticulosis

## 2020-10-30 NOTE — Telephone Encounter (Signed)
Called patient and left him a message letting him know that I would be sending him information about Diverticulosis on his MyChart.

## 2020-11-05 ENCOUNTER — Ambulatory Visit (INDEPENDENT_AMBULATORY_CARE_PROVIDER_SITE_OTHER): Payer: 59 | Admitting: Psychology

## 2020-11-05 DIAGNOSIS — F4322 Adjustment disorder with anxiety: Secondary | ICD-10-CM | POA: Diagnosis not present

## 2020-11-19 ENCOUNTER — Ambulatory Visit (INDEPENDENT_AMBULATORY_CARE_PROVIDER_SITE_OTHER): Payer: 59 | Admitting: Psychology

## 2020-11-19 DIAGNOSIS — F4322 Adjustment disorder with anxiety: Secondary | ICD-10-CM | POA: Diagnosis not present

## 2020-12-03 ENCOUNTER — Ambulatory Visit: Payer: 59 | Admitting: Psychology

## 2020-12-04 ENCOUNTER — Ambulatory Visit (INDEPENDENT_AMBULATORY_CARE_PROVIDER_SITE_OTHER): Payer: 59 | Admitting: Psychology

## 2020-12-04 DIAGNOSIS — F4322 Adjustment disorder with anxiety: Secondary | ICD-10-CM

## 2020-12-10 ENCOUNTER — Ambulatory Visit (INDEPENDENT_AMBULATORY_CARE_PROVIDER_SITE_OTHER): Payer: 59 | Admitting: Psychology

## 2020-12-10 DIAGNOSIS — F4322 Adjustment disorder with anxiety: Secondary | ICD-10-CM | POA: Diagnosis not present

## 2020-12-11 ENCOUNTER — Ambulatory Visit: Payer: 59 | Admitting: Psychology

## 2020-12-16 ENCOUNTER — Ambulatory Visit (INDEPENDENT_AMBULATORY_CARE_PROVIDER_SITE_OTHER): Payer: 59 | Admitting: Family Medicine

## 2020-12-16 ENCOUNTER — Other Ambulatory Visit: Payer: Self-pay

## 2020-12-16 ENCOUNTER — Encounter: Payer: Self-pay | Admitting: Family Medicine

## 2020-12-16 DIAGNOSIS — Z8589 Personal history of malignant neoplasm of other organs and systems: Secondary | ICD-10-CM | POA: Diagnosis not present

## 2020-12-16 DIAGNOSIS — E291 Testicular hypofunction: Secondary | ICD-10-CM

## 2020-12-16 DIAGNOSIS — R21 Rash and other nonspecific skin eruption: Secondary | ICD-10-CM

## 2020-12-16 DIAGNOSIS — B009 Herpesviral infection, unspecified: Secondary | ICD-10-CM

## 2020-12-16 DIAGNOSIS — A6000 Herpesviral infection of urogenital system, unspecified: Secondary | ICD-10-CM | POA: Insufficient documentation

## 2020-12-16 HISTORY — DX: Herpesviral infection, unspecified: B00.9

## 2020-12-16 MED ORDER — VALACYCLOVIR HCL 1 G PO TABS: ORAL_TABLET | ORAL | 0 refills | Status: DC

## 2020-12-16 NOTE — Assessment & Plan Note (Signed)
He will continue to see his dermatologist.

## 2020-12-16 NOTE — Assessment & Plan Note (Signed)
History of hypergonadism.  Exam completed today.  He will drop off his paperwork for me to review.  Discussed that we could complete lab work as well depending on what this other places ordering.

## 2020-12-16 NOTE — Patient Instructions (Signed)
Nice to see you. Please get me the form.

## 2020-12-16 NOTE — Assessment & Plan Note (Signed)
The patient notes he has not been following up with oncology.  He states he is finished with that follow-up.  Benign exam today.  Monitor for any recurrence.

## 2020-12-16 NOTE — Progress Notes (Signed)
Tommi Rumps, MD Phone: [phone]  Philip Cordova is a 55 y.o. male who presents today for f/u.  Testosterone deficiency: Patient presents for an exam for paperwork for testosterone supplementation.  [facility] which [facility] based in [place].  He has a history of testosterone deficiency and wants his levels at an optimum.  He has not been on any supplementation recently.  He notes he is only going to get a prescription from them and will not use anything that they directly supplied.  He is having labs completed through [facility] for this.  He denies chest pain, shortness of breath, abdominal pain, blood in stool, any blood in his urine, urinary difficulties, nausea, vomiting, diarrhea, vision changes, numbness, and weakness.  He notes rare headaches related to slight dehydration at times.  He has been working a lot on diet and exercise to get himself to be as healthy as possible.   Genital herpes: Patient notes occasional outbreaks which he takes Valtrex for.  This is beneficial.  He notes slightly more frequent outbreaks recently as he has had significant stress dealing with his mother.  He is working on getting her into a facility and notes the stress will go away once that occurs.  Rash: Patient notes occasional rash with dry skin.  He has seen dermatology and he reports they advised him that it was just related to dry skin.  He uses triamcinolone with good benefit.  Social History   Tobacco Use  Smoking Status Former Smoker  . Years: 1.00  . Types: Cigarettes  Smokeless Tobacco Never Used  Tobacco Comment   "YEARS AGO"    Current Outpatient Medications on File Prior to Visit  Medication Sig Dispense Refill  . albuterol (PROVENTIL HFA) 108 (90 Base) MCG/ACT inhaler INHALE 1 PUFF INTO THE LUNGS EVERY 6 HOURS AS NEEDED FOR WHEEZING. 6.7 g 0  . ANUCORT-HC 25 MG suppository INSERT 1 SUPPOSITORY RECTALLY AT BEDTIME FOR 10 DAYS 10 suppository 0  .  EPINEPHrine 0.3 mg/0.3 mL IJ SOAJ injection Inject into the muscle.    . Multiple Vitamins-Minerals (MULTIVITAMIN WITH MINERALS) tablet Take by mouth.    . neomycin-polymyxin-hydrocortisone (CORTISPORIN) OTIC solution Place 4 drops into both ears 3 (three) times daily. 10 mL 0  . Omega-3 Fatty Acids (FISH OIL) 1000 MG CAPS Take 1 capsule by mouth daily.     Marland Kitchen triamcinolone lotion (KENALOG) 0.1 % Apply 1 application topically 2 (two) times daily as needed. For rash 60 mL 0   No current facility-administered medications on file prior to visit.     ROS see history of present illness  Objective  Physical Exam Vitals:   12/16/20 0910  BP: 110/80  Pulse: 76  Temp: (!) 95.7 F (35.4 C)  SpO2: 99%    BP Readings from Last 3 Encounters:  12/16/20 110/80  08/14/20 106/70  08/06/20 125/78   Wt Readings from Last 3 Encounters:  12/16/20 195 lb 3.2 oz (88.5 kg)  08/14/20 193 lb (87.5 kg)  08/06/20 196 lb (88.9 kg)    Physical Exam Constitutional:      General: He is not in acute distress.    Appearance: He is not diaphoretic.  HENT:     Mouth/Throat:     Mouth: Mucous membranes are moist.     Pharynx: Oropharynx is clear. No oropharyngeal exudate or posterior oropharyngeal erythema.  Cardiovascular:     Rate and Rhythm: Normal rate and regular rhythm.     Heart sounds:  Normal heart sounds.  Pulmonary:     Effort: Pulmonary effort is normal.     Breath sounds: Normal breath sounds.  Abdominal:     General: Bowel sounds are normal. There is no distension.     Palpations: Abdomen is soft.     Tenderness: There is no abdominal tenderness. There is no guarding or rebound.  Genitourinary:    Penis: Normal.      Testes: Normal.  Musculoskeletal:     Right lower leg: No edema.     Left lower leg: No edema.  Lymphadenopathy:     Cervical: No cervical adenopathy.  Skin:    General: Skin is warm and dry.  Neurological:     Mental Status: He is alert.     Comments: 5/5  strength in bilateral biceps, triceps, grip, quads, hamstrings, plantar and dorsiflexion, sensation to light touch intact in bilateral UE and LE, normal gait      Assessment/Plan: Please see individual problem list.  Problem List Items Addressed This Visit    Testicular hypergonadotropic hypogonadism    History of hypergonadism.  Exam completed today.  He will drop off his paperwork for me to review.  Discussed that we could complete lab work as well depending on what this other places ordering.      History of squamous cell carcinoma    The patient notes he has not been following up with oncology.  He states he is finished with that follow-up.  Benign exam today.  Monitor for any recurrence.      Herpes    Slight increase in frequency of outbreaks given recent stressors.  Discussed that decreasing this stress may make the outbreaks less frequent.  Valtrex refilled as outlined.      Relevant Medications   valACYclovir (VALTREX) 1000 MG tablet   Rash    He will continue to see his dermatologist.          This visit occurred during the SARS-CoV-2 public health emergency.  Safety protocols were in place, including screening questions prior to the visit, additional usage of staff PPE, and extensive cleaning of exam room while observing appropriate contact time as indicated for disinfecting solutions.    Tommi Rumps, MD [place]

## 2020-12-16 NOTE — Assessment & Plan Note (Signed)
Slight increase in frequency of outbreaks given recent stressors.  Discussed that decreasing this stress may make the outbreaks less frequent.  Valtrex refilled as outlined.

## 2020-12-19 ENCOUNTER — Ambulatory Visit (INDEPENDENT_AMBULATORY_CARE_PROVIDER_SITE_OTHER): Payer: 59 | Admitting: Psychology

## 2020-12-19 DIAGNOSIS — F4322 Adjustment disorder with anxiety: Secondary | ICD-10-CM

## 2020-12-25 ENCOUNTER — Ambulatory Visit (INDEPENDENT_AMBULATORY_CARE_PROVIDER_SITE_OTHER): Payer: 59 | Admitting: Psychology

## 2020-12-25 DIAGNOSIS — F4322 Adjustment disorder with anxiety: Secondary | ICD-10-CM

## 2021-01-02 ENCOUNTER — Ambulatory Visit (INDEPENDENT_AMBULATORY_CARE_PROVIDER_SITE_OTHER): Payer: 59 | Admitting: Psychology

## 2021-01-02 DIAGNOSIS — F4322 Adjustment disorder with anxiety: Secondary | ICD-10-CM | POA: Diagnosis not present

## 2021-01-16 ENCOUNTER — Ambulatory Visit (INDEPENDENT_AMBULATORY_CARE_PROVIDER_SITE_OTHER): Payer: 59 | Admitting: Psychology

## 2021-01-16 DIAGNOSIS — F4322 Adjustment disorder with anxiety: Secondary | ICD-10-CM

## 2021-01-21 ENCOUNTER — Ambulatory Visit (INDEPENDENT_AMBULATORY_CARE_PROVIDER_SITE_OTHER): Payer: 59 | Admitting: Psychology

## 2021-01-21 DIAGNOSIS — F4322 Adjustment disorder with anxiety: Secondary | ICD-10-CM

## 2021-01-29 ENCOUNTER — Ambulatory Visit (INDEPENDENT_AMBULATORY_CARE_PROVIDER_SITE_OTHER): Payer: 59 | Admitting: Psychology

## 2021-01-29 DIAGNOSIS — F4322 Adjustment disorder with anxiety: Secondary | ICD-10-CM | POA: Diagnosis not present

## 2021-02-04 ENCOUNTER — Other Ambulatory Visit: Payer: Self-pay

## 2021-02-04 ENCOUNTER — Ambulatory Visit (INDEPENDENT_AMBULATORY_CARE_PROVIDER_SITE_OTHER): Payer: 59 | Admitting: Dermatology

## 2021-02-04 DIAGNOSIS — R21 Rash and other nonspecific skin eruption: Secondary | ICD-10-CM | POA: Diagnosis not present

## 2021-02-04 NOTE — Patient Instructions (Addendum)
If you have any questions or concerns for your doctor, please call our main line at [phone] and press option 4 to reach your doctor's medical assistant. If no one answers, please leave a voicemail as directed and we will return your call as soon as possible. Messages left after 4 pm will be answered the following business day.   You may also send us a message via MyChart. We typically respond to MyChart messages within 1-2 business days.  For prescription refills, please ask your pharmacy to contact our office. Our fax number is [phone].  If you have an urgent issue when the clinic is closed that cannot wait until the next business day, you can page your doctor at the number below.    Please note that while we do our best to be available for urgent issues outside of office hours, we are not available 24/7.   If you have an urgent issue and are unable to reach us, you may choose to seek medical care at your doctor's office, retail clinic, urgent care center, or emergency room.  If you have a medical emergency, please immediately call 911 or go to the emergency department.  Pager Numbers  - Dr. Kowalski: [phone]  - Dr. Moye: [phone]  - Dr. Stewart: [phone]  In the event of inclement weather, please call our main line at [phone] for an update on the status of any delays or closures.  Dermatology Medication Tips: Please keep the boxes that topical medications come in in order to help keep track of the instructions about where and how to use these. Pharmacies typically print the medication instructions only on the boxes and not directly on the medication tubes.   If your medication is too expensive, please contact our office at [phone] option 4 or send us a message through MyChart.   We are unable to tell what your co-pay for medications will be in advance as this is different depending on your insurance coverage. However, we may be able to find a  substitute medication at lower cost or fill out paperwork to get insurance to cover a needed medication.   If a prior authorization is required to get your medication covered by your insurance company, please allow us 1-2 business days to complete this process.  Drug prices often vary depending on where the prescription is filled and some pharmacies may offer cheaper prices.  The website www.goodrx.com contains coupons for medications through different pharmacies. The prices here do not account for what the cost may be with help from insurance (it may be cheaper with your insurance), but the website can give you the price if you did not use any insurance.  - You can print the associated coupon and take it with your prescription to the pharmacy.  - You may also stop by our office during regular business hours and pick up a GoodRx coupon card.  - If you need your prescription sent electronically to a different pharmacy, notify our office through [facility] MyChart or by phone at [phone] option 4.     Wound Care Instructions  1. Cleanse wound gently with soap and water once a day then pat dry with clean gauze. Apply a thing coat of Petrolatum (petroleum jelly, "Vaseline") over the wound (unless you have an allergy to this). We recommend that you use a new, sterile tube of Vaseline. Do not pick or remove scabs. Do not remove the yellow or white "healing tissue" from the base of the wound.    2. Cover the wound with fresh, clean, nonstick gauze and secure with paper tape. You may use Band-Aids in place of gauze and tape if the would is small enough, but would recommend trimming much of the tape off as there is often too much. Sometimes Band-Aids can irritate the skin.  3. You should call the office for your biopsy report after 1 week if you have not already been contacted.  4. If you experience any problems, such as abnormal amounts of bleeding, swelling, significant bruising, significant pain,  or evidence of infection, please call the office immediately.  5. FOR ADULT SURGERY PATIENTS: If you need something for pain relief you may take 1 extra strength Tylenol (acetaminophen) AND 2 Ibuprofen (200mg each) together every 4 hours as needed for pain. (do not take these if you are allergic to them or if you have a reason you should not take them.) Typically, you may only need pain medication for 1 to 3 days.     

## 2021-02-04 NOTE — Progress Notes (Signed)
   Follow-Up Visit   Subjective  Philip Cordova is a 55 y.o. male who presents for the following: rash (Chest, since 2021, TMC 0.1% oint ~6x/day, moisturizer).  The following portions of the chart were reviewed this encounter and updated as appropriate:   Tobacco  Allergies  Meds  Problems  Med Hx  Surg Hx  Fam Hx      Review of Systems:  No other skin or systemic complaints except as noted in HPI or Assessment and Plan.  Objective  Well appearing patient in no apparent distress; mood and affect are within normal limits.  A focused examination was performed including chest. Relevant physical exam findings are noted in the Assessment and Plan.  Chest Indurated plaques on chest and bil axilla              Assessment & Plan    Rash - Granuloma annulare vs other Chest  Skin / nail biopsy - Chest Type of biopsy: punch   Informed consent: discussed and consent obtained   Timeout: patient name, date of birth, surgical site, and procedure verified   Procedure prep:  Patient was prepped and draped in usual sterile fashion (The patient was cleaned and prepped) Prep type:  Isopropyl alcohol Anesthesia: the lesion was anesthetized in a standard fashion   Anesthetic:  1% lidocaine w/ epinephrine 1-100,000 buffered w/ 8.4% NaHCO3 Punch size:  3 mm Suture size:  4-0 Suture type: nylon   Hemostasis achieved with: suture, pressure and aluminum chloride   Outcome: patient tolerated procedure well   Post-procedure details: sterile dressing applied and wound care instructions given   Dressing type: bandage, pressure dressing and bacitracin    Specimen 1 - Surgical pathology Differential Diagnosis: D48.5 Granuloma Annulare vs other  Check Margins: no Indurated plaques on chest and bil axilla  Return in about 1 week (around 02/11/2021) for bx f/u and suture removal.  I, Sonya Hupman, RMA, am acting as scribe for Sarina Ser, MD . Documentation: I have reviewed the above  documentation for accuracy and completeness, and I agree with the above.  Sarina Ser, MD

## 2021-02-05 ENCOUNTER — Ambulatory Visit (INDEPENDENT_AMBULATORY_CARE_PROVIDER_SITE_OTHER): Payer: 59 | Admitting: Psychology

## 2021-02-05 DIAGNOSIS — F4322 Adjustment disorder with anxiety: Secondary | ICD-10-CM | POA: Diagnosis not present

## 2021-02-06 ENCOUNTER — Encounter: Payer: Self-pay | Admitting: Dermatology

## 2021-02-11 ENCOUNTER — Other Ambulatory Visit: Payer: Self-pay

## 2021-02-11 ENCOUNTER — Ambulatory Visit (INDEPENDENT_AMBULATORY_CARE_PROVIDER_SITE_OTHER): Payer: 59 | Admitting: Dermatology

## 2021-02-11 DIAGNOSIS — R21 Rash and other nonspecific skin eruption: Secondary | ICD-10-CM

## 2021-02-11 NOTE — Patient Instructions (Signed)

## 2021-02-11 NOTE — Progress Notes (Signed)
   Follow-Up Visit   Subjective  Philip Cordova is a 55 y.o. male who presents for the following: Suture / Staple Removal (1 week suture removal biopsy was taken on the chest ).  The following portions of the chart were reviewed this encounter and updated as appropriate:   Tobacco  Allergies  Meds  Problems  Med Hx  Surg Hx  Fam Hx      Review of Systems:  No other skin or systemic complaints except as noted in HPI or Assessment and Plan.  Objective  Well appearing patient in no apparent distress; mood and affect are within normal limits.  A focused examination was performed including chest. Relevant physical exam findings are noted in the Assessment and Plan.  chest Well healed scar    Assessment & Plan  Rash chest  Discussed with pt we will call him once we receive his pathology results  Encounter for Removal of Sutures - Incision site at the chest is clean, dry and intact - Wound cleansed, sutures removed, wound cleansed and steri strips applied.  - Patient advised to keep steri-strips dry until they fall off. - Scars remodel for a full year. - Once steri-strips fall off, patient can apply over-the-counter silicone scar cream each night to help with scar remodeling if desired. - Patient advised to call with any concerns or if they notice any new or changing lesions.   Return if symptoms worsen or fail to improve.  IMarye Round, CMA, am acting as scribe for Sarina Ser, MD .  Documentation: I have reviewed the above documentation for accuracy and completeness, and I agree with the above.  Sarina Ser, MD

## 2021-02-12 ENCOUNTER — Ambulatory Visit (INDEPENDENT_AMBULATORY_CARE_PROVIDER_SITE_OTHER): Payer: 59 | Admitting: Psychology

## 2021-02-12 DIAGNOSIS — F4322 Adjustment disorder with anxiety: Secondary | ICD-10-CM

## 2021-02-17 ENCOUNTER — Encounter: Payer: Self-pay | Admitting: Dermatology

## 2021-02-17 ENCOUNTER — Ambulatory Visit (INDEPENDENT_AMBULATORY_CARE_PROVIDER_SITE_OTHER): Payer: 59 | Admitting: Dermatology

## 2021-02-17 ENCOUNTER — Other Ambulatory Visit: Payer: Self-pay

## 2021-02-17 DIAGNOSIS — M310 Hypersensitivity angiitis: Secondary | ICD-10-CM

## 2021-02-17 NOTE — Progress Notes (Signed)
   Follow-Up Visit   Subjective  Philip Cordova is a 55 y.o. male who presents for the following: Discuss pathology results and treatment options (Patient states that he is no longer using TMC 0.1% lotion. Instead he has been using aloe vera plant extract to aa's and OTC working hands moisturizer for the past 10 days and has noticed some improvement./). Patient states that he has had the rash for over a year, and health wise he feels great.  The following portions of the chart were reviewed this encounter and updated as appropriate:   Tobacco  Allergies  Meds  Problems  Med Hx  Surg Hx  Fam Hx      Review of Systems:  No other skin or systemic complaints except as noted in HPI or Assessment and Plan.  Objective  Well appearing patient in no apparent distress; mood and affect are within normal limits.  A focused examination was performed including the trunk and extremities. Relevant physical exam findings are noted in the Assessment and Plan.  Trunk Annular edematous plaques on the chest.        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  Pt has never had the rash present during any dermatology visit in past.  He has tried to make appointments for when rash present but it resolves before his appointment. Discussed pathology results and possible causes of condition including illness (patient states that he has been feeling fine lately). Patient has a hx of oral HPV related cancer. Plan to evaluate labs and chest x-ray. Will send today's note to his PCP Dr. [name].   CMP - Trunk  Hep B Surface Antibody - Trunk  Hep B Surface Antigen - Trunk  Hep C Antibody - Trunk  ANA - Trunk  TSH - Trunk  DG Chest 2 View - Trunk  Urinalysis - Trunk  CBC with Differential/Platelets - Trunk  Lyme Disease, Western Blot - Trunk  Return for appointment as scheduled - pending  labs and x-ray results.  Luther Redo, CMA, am acting as scribe for Sarina Ser, MD .  Documentation: I have reviewed the above documentation for accuracy and completeness, and I agree with the above.  Sarina Ser, MD

## 2021-02-17 NOTE — Patient Instructions (Signed)

## 2021-02-18 ENCOUNTER — Ambulatory Visit
Admission: RE | Admit: 2021-02-18 | Discharge: 2021-02-18 | Disposition: A | Discharge status: Home / Self Care | Payer: 59 | Attending: Dermatology | Admitting: Dermatology

## 2021-02-18 ENCOUNTER — Other Ambulatory Visit: Payer: Self-pay | Admitting: Dermatology

## 2021-02-18 ENCOUNTER — Ambulatory Visit
Admission: RE | Admit: 2021-02-18 | Discharge: 2021-02-18 | Disposition: A | Discharge status: Home / Self Care | Payer: 59 | Source: Ambulatory Visit | Attending: Dermatology | Admitting: Dermatology

## 2021-02-18 DIAGNOSIS — M310 Hypersensitivity angiitis: Secondary | ICD-10-CM | POA: Insufficient documentation

## 2021-02-19 LAB — URINALYSIS
Bilirubin, UA: NEGATIVE
Glucose, UA: NEGATIVE
Ketones, UA: NEGATIVE
Leukocytes,UA: NEGATIVE
Nitrite, UA: NEGATIVE
RBC, UA: NEGATIVE
Specific Gravity, UA: 1.028 (ref 1.005–1.030)
Urobilinogen, Ur: 0.2 mg/dL (ref 0.2–1.0)
pH, UA: 7 (ref 5.0–7.5)

## 2021-02-20 ENCOUNTER — Encounter: Payer: Self-pay | Admitting: Family Medicine

## 2021-02-20 ENCOUNTER — Encounter: Payer: Self-pay | Admitting: Dermatology

## 2021-02-20 LAB — COMPREHENSIVE METABOLIC PANEL
ALT: 16 IU/L (ref 0–44)
AST: 24 IU/L (ref 0–40)
Albumin/Globulin Ratio: 1.9 (ref 1.2–2.2)
Albumin: 4.5 g/dL (ref 3.8–4.9)
Alkaline Phosphatase: 67 IU/L (ref 44–121)
BUN/Creatinine Ratio: 19 (ref 9–20)
BUN: 21 mg/dL (ref 6–24)
Bilirubin Total: 0.3 mg/dL (ref 0.0–1.2)
CO2: 23 mmol/L (ref 20–29)
Calcium: 9.2 mg/dL (ref 8.7–10.2)
Chloride: 102 mmol/L (ref 96–106)
Creatinine, Ser: 1.13 mg/dL (ref 0.76–1.27)
Globulin, Total: 2.4 g/dL (ref 1.5–4.5)
Glucose: 93 mg/dL (ref 65–99)
Potassium: 4.1 mmol/L (ref 3.5–5.2)
Sodium: 139 mmol/L (ref 134–144)
Total Protein: 6.9 g/dL (ref 6.0–8.5)
eGFR: 78 mL/min/{1.73_m2} (ref >59)

## 2021-02-20 LAB — HEPATITIS B SURFACE ANTIBODY,QUALITATIVE: Hep B Surface Ab, Qual: REACTIVE

## 2021-02-20 LAB — HEPATITIS B SURFACE ANTIGEN: Hepatitis B Surface Ag: NEGATIVE

## 2021-02-20 LAB — ANA: Anti Nuclear Antibody (ANA): NEGATIVE

## 2021-02-20 LAB — TSH: TSH: 7.34 u[IU]/mL — ABNORMAL HIGH (ref 0.450–4.500)

## 2021-02-20 LAB — HEPATITIS C ANTIBODY: Hep C Virus Ab: <0.1 s/co ratio (ref 0.0–0.9)

## 2021-02-23 ENCOUNTER — Ambulatory Visit (INDEPENDENT_AMBULATORY_CARE_PROVIDER_SITE_OTHER): Payer: 59 | Admitting: Family Medicine

## 2021-02-23 ENCOUNTER — Other Ambulatory Visit: Payer: Self-pay

## 2021-02-23 VITALS — BP 125/60 | HR 65 | Temp 97.8°F | Ht 73.0 in | Wt 197.4 lb

## 2021-02-23 DIAGNOSIS — G629 Polyneuropathy, unspecified: Secondary | ICD-10-CM

## 2021-02-23 DIAGNOSIS — R7989 Other specified abnormal findings of blood chemistry: Secondary | ICD-10-CM

## 2021-02-23 DIAGNOSIS — M542 Cervicalgia: Secondary | ICD-10-CM | POA: Diagnosis not present

## 2021-02-23 DIAGNOSIS — G8929 Other chronic pain: Secondary | ICD-10-CM

## 2021-02-23 DIAGNOSIS — M310 Hypersensitivity angiitis: Secondary | ICD-10-CM | POA: Diagnosis not present

## 2021-02-23 HISTORY — DX: Other specified abnormal findings of blood chemistry: R79.89

## 2021-02-23 LAB — T4, FREE: Free T4: 0.75 ng/dL (ref 0.60–1.60)

## 2021-02-23 LAB — TSH: TSH: 4.28 u[IU]/mL (ref 0.35–4.50)

## 2021-02-23 LAB — T3, FREE: T3, Free: 3.5 pg/mL (ref 2.3–4.2)

## 2021-02-23 NOTE — Assessment & Plan Note (Signed)
Discussed repeating labs.  Discussed the potential for thyroid medication moving forward.

## 2021-02-23 NOTE — Progress Notes (Signed)
Tommi Rumps, MD Phone: [phone]  Philip Cordova is a 55 y.o. male who presents today for f/u.  Leukocytoclastic vasculitis: Patient was diagnosed with this through dermatology.  He had a biopsy revealing this.  He had a rash on his chest that was circular in nature that has been present for a year or so.  He has gotten the rash to improve with aloe vera leaf and a hand lotion.  Dermatology started a work-up for this with labs and a chest x-ray.  The chest x-ray was unremarkable.  His lab work revealed an elevated TSH.  The patient notes there was some mention of seeing rheumatology.  Neck pain: Patient notes he has had intermittent issues with neck spasm on the right side.  Typically it bothers him if he is running or doing jumping exercises.  He had an episode of numbness along the posterior aspect of his neck down towards his scapula that eventually resolved.  He notes significant improvement in symptoms with avoiding running.  Neuropathy: Patient notes chronic neuropathy symptoms in the bottoms of his feet for quite some time.  He notes a family history of neuropathy and his mom and brother both have to be on gabapentin  Social History   Tobacco Use  Smoking Status Former   Years: 1.00   Pack years: 0.00   Types: Cigarettes  Smokeless Tobacco Never  Tobacco Comments   "YEARS AGO"    Current Outpatient Medications on File Prior to Visit  Medication Sig Dispense Refill   albuterol (PROVENTIL HFA) 108 (90 Base) MCG/ACT inhaler INHALE 1 PUFF INTO THE LUNGS EVERY 6 HOURS AS NEEDED FOR WHEEZING. 6.7 g 0   ANUCORT-HC 25 MG suppository INSERT 1 SUPPOSITORY RECTALLY AT BEDTIME FOR 10 DAYS 10 suppository 0   EPINEPHrine 0.3 mg/0.3 mL IJ SOAJ injection Inject into the muscle.     Multiple Vitamins-Minerals (MULTIVITAMIN WITH MINERALS) tablet Take by mouth.     neomycin-polymyxin-hydrocortisone (CORTISPORIN) OTIC solution Place 4 drops into both ears 3 (three) times daily. 10 mL 0    Omega-3 Fatty Acids (FISH OIL) 1000 MG CAPS Take 1 capsule by mouth daily.      valACYclovir (VALTREX) 1000 MG tablet Take 1 tablet by mouth twice daily 20 tablet 0   triamcinolone lotion (KENALOG) 0.1 % Apply 1 application topically 2 (two) times daily as needed. For rash (Patient not taking: Reported on 02/23/2021) 60 mL 0   No current facility-administered medications on file prior to visit.     ROS see history of present illness  Objective  Physical Exam Vitals:   02/23/21 1131  BP: 125/60  Pulse: 65  Temp: 97.8 F (36.6 C)  SpO2: 97%    BP Readings from Last 3 Encounters:  02/23/21 125/60  12/16/20 110/80  08/14/20 106/70   Wt Readings from Last 3 Encounters:  02/23/21 197 lb 6.4 oz (89.5 kg)  12/16/20 195 lb 3.2 oz (88.5 kg)  08/14/20 193 lb (87.5 kg)    Physical Exam Constitutional:      General: He is not in acute distress.    Appearance: He is not diaphoretic.  Cardiovascular:     Rate and Rhythm: Normal rate and regular rhythm.     Heart sounds: Normal heart sounds.  Pulmonary:     Effort: Pulmonary effort is normal.     Breath sounds: Normal breath sounds.  Chest:  Breasts:    Right: No axillary adenopathy or supraclavicular adenopathy.     Left: No axillary adenopathy  or supraclavicular adenopathy.  Lymphadenopathy:     Head:     Right side of head: No submental or submandibular adenopathy.     Left side of head: No submental or submandibular adenopathy.     Cervical: No cervical adenopathy.     Upper Body:     Right upper body: No supraclavicular or axillary adenopathy.     Left upper body: No supraclavicular or axillary adenopathy.  Skin:    General: Skin is warm and dry.  Neurological:     Mental Status: He is alert.     Comments: 5/5 strength in bilateral biceps, triceps, grip, quads, hamstrings, plantar and dorsiflexion, sensation to light touch intact in bilateral UE and LE, normal gait     Assessment/Plan: Please see individual problem  list.  Problem List Items Addressed This Visit     Elevated TSH    Discussed repeating labs.  Discussed the potential for thyroid medication moving forward.       Relevant Orders   TSH   T4, free   T3, free   Leukocytoclastic vasculitis ([place]) - Primary    Diagnosed through biopsy.  Discussed follow-up thyroid function testing and HIV screening.  I will refer him to rheumatology as well.       Relevant Orders   TSH   T4, free   T3, free   HIV antibody (with reflex)   Ambulatory referral to Rheumatology   Neck pain    It sounds as though he may have nerve impingement resulting in spasm and numbness.  Prior imaging revealed facet arthropathy with some foraminal narrowing.  His symptoms have improved with removing running and jumping exercises from his routine.  We will refer for physical therapy.       Neuropathy    He reports very minimal symptoms of neuropathy.  He does not want medication for this at this time.  Interestingly his mother and brother both have a history of similar issues.  I wonder if the recently diagnosed vasculitis could be playing a role with this.  We will see what rheumatology has to say about this first and then consider other evaluation if needed.        Return in about 3 months (around 05/26/2021) for f/u thyroid function.  This visit occurred during the SARS-CoV-2 public health emergency.  Safety protocols were in place, including screening questions prior to the visit, additional usage of staff PPE, and extensive cleaning of exam room while observing appropriate contact time as indicated for disinfecting solutions.   I have spent 33 minutes in the care of this patient regarding history taking, documentation, review of dermatology pathology report, review of recent lab work, chest x-ray report.   Tommi Rumps, MD [place]

## 2021-02-23 NOTE — Assessment & Plan Note (Signed)
Diagnosed through biopsy.  Discussed follow-up thyroid function testing and HIV screening.  I will refer him to rheumatology as well.

## 2021-02-23 NOTE — Assessment & Plan Note (Signed)
He reports very minimal symptoms of neuropathy.  He does not want medication for this at this time.  Interestingly his mother and brother both have a history of similar issues.  I wonder if the recently diagnosed vasculitis could be playing a role with this.  We will see what rheumatology has to say about this first and then consider other evaluation if needed.

## 2021-02-23 NOTE — Patient Instructions (Signed)
Nice to see you. We will get additional labs today. I placed a referral to rheumatology and physical therapy for you.

## 2021-02-23 NOTE — Assessment & Plan Note (Signed)
It sounds as though he may have nerve impingement resulting in spasm and numbness.  Prior imaging revealed facet arthropathy with some foraminal narrowing.  His symptoms have improved with removing running and jumping exercises from his routine.  We will refer for physical therapy.

## 2021-02-24 LAB — HIV ANTIBODY (ROUTINE TESTING W REFLEX): HIV 1&2 Ab, 4th Generation: NONREACTIVE

## 2021-02-26 ENCOUNTER — Ambulatory Visit (INDEPENDENT_AMBULATORY_CARE_PROVIDER_SITE_OTHER): Payer: 59 | Admitting: Psychology

## 2021-02-26 DIAGNOSIS — F4322 Adjustment disorder with anxiety: Secondary | ICD-10-CM | POA: Diagnosis not present

## 2021-03-04 ENCOUNTER — Telehealth: Payer: Self-pay

## 2021-03-04 ENCOUNTER — Other Ambulatory Visit: Payer: Self-pay | Admitting: Family Medicine

## 2021-03-04 ENCOUNTER — Telehealth: Payer: Self-pay | Admitting: Family Medicine

## 2021-03-04 ENCOUNTER — Other Ambulatory Visit: Payer: Self-pay | Admitting: Internal Medicine

## 2021-03-04 DIAGNOSIS — M310 Hypersensitivity angiitis: Secondary | ICD-10-CM

## 2021-03-04 DIAGNOSIS — J45909 Unspecified asthma, uncomplicated: Secondary | ICD-10-CM

## 2021-03-04 MED ORDER — VALACYCLOVIR HCL 1 G PO TABS: ORAL_TABLET | ORAL | 0 refills | Status: DC

## 2021-03-04 MED ORDER — FLUTICASONE-SALMETEROL 250-50 MCG/ACT IN AEPB
1.0000 | INHALATION_SPRAY | Freq: Two times a day (BID) | RESPIRATORY_TRACT | 2 refills | Status: DC
Start: 2021-03-04 — End: 2021-06-08

## 2021-03-04 NOTE — Telephone Encounter (Signed)
Advised patient of results. Left order for Thyroid panel at front desk for patient to pick up/hd

## 2021-03-04 NOTE — Telephone Encounter (Signed)
-----   Message from Ralene Bathe, MD sent at 02/19/2021  3:25 PM EDT ----- Urinalysis from June 2022 is OK  Awaiting other blood draw lab results.

## 2021-03-04 NOTE — Telephone Encounter (Signed)
PT needs a refill of the Advair Diskus.

## 2021-03-04 NOTE — Telephone Encounter (Signed)
Ok to refill? Medication is no longer on patients medlist?

## 2021-03-04 NOTE — Telephone Encounter (Signed)
-----   Message from Ralene Bathe, MD sent at 02/19/2021  3:23 PM EDT ----- IMPRESSION: No active cardiopulmonary disease.  Chest X-ray from June 2022 is OK  (also compared to CXR from 2018)

## 2021-03-04 NOTE — Telephone Encounter (Signed)
-----   Message from Ralene Bathe, MD sent at 02/20/2021 12:57 PM EDT ----- Lab tests from 02/18/2021: Chemistries are normal - Liver and kidney tests normal.  Thyroid / TSH is HIGH which may indicate low thyroid activity -- 1st step is to repeat - we will do a THYROID PANEL (TSH; T4; T3 etc. )  Hepatitis B and C tests are normal / negative ANA - is negative = normal   Please give pt order for THYROID PANEL (TSH; T4; T3 etc).

## 2021-03-04 NOTE — Telephone Encounter (Signed)
Advised patient of results/hd  

## 2021-03-05 ENCOUNTER — Ambulatory Visit (INDEPENDENT_AMBULATORY_CARE_PROVIDER_SITE_OTHER): Payer: 59 | Admitting: Psychology

## 2021-03-05 DIAGNOSIS — F4322 Adjustment disorder with anxiety: Secondary | ICD-10-CM

## 2021-03-05 LAB — THYROID PANEL WITH TSH
Free Thyroxine Index: 1.2 (ref 1.2–4.9)
T3 Uptake Ratio: 25 % (ref 24–39)
T4, Total: 4.8 ug/dL (ref 4.5–12.0)
TSH: 4.54 u[IU]/mL — ABNORMAL HIGH (ref 0.450–4.500)

## 2021-03-10 ENCOUNTER — Other Ambulatory Visit: Payer: Self-pay | Admitting: Dermatology

## 2021-03-10 LAB — LYME DISEASE, WESTERN BLOT
IgG P18 Ab.: ABSENT
IgG P23 Ab.: ABSENT
IgG P30 Ab.: ABSENT
IgG P39 Ab.: ABSENT
IgG P45 Ab.: ABSENT
IgG P66 Ab.: ABSENT
IgG P93 Ab.: ABSENT
IgM P23 Ab.: ABSENT
IgM P39 Ab.: ABSENT
IgM P41 Ab.: ABSENT
Lyme IgG Wb: NEGATIVE
Lyme IgM Wb: NEGATIVE

## 2021-03-10 LAB — CBC WITH DIFFERENTIAL/PLATELET
Basophils Absolute: 0.1 10*3/uL (ref 0.0–0.2)
Basos: 1 %
EOS (ABSOLUTE): 0.3 10*3/uL (ref 0.0–0.4)
Eos: 3 %
Hematocrit: 38.8 % (ref 37.5–51.0)
Hemoglobin: 12 g/dL — ABNORMAL LOW (ref 13.0–17.7)
Immature Grans (Abs): 0 10*3/uL (ref 0.0–0.1)
Immature Granulocytes: 0 %
Lymphocytes Absolute: 0.8 10*3/uL (ref 0.7–3.1)
Lymphs: 8 %
MCH: 27.3 pg (ref 26.6–33.0)
MCHC: 30.9 g/dL — ABNORMAL LOW (ref 31.5–35.7)
MCV: 88 fL (ref 79–97)
Monocytes Absolute: 0.7 10*3/uL (ref 0.1–0.9)
Monocytes: 7 %
Neutrophils Absolute: 7.7 10*3/uL — ABNORMAL HIGH (ref 1.4–7.0)
Neutrophils: 81 %
Platelets: 333 10*3/uL (ref 150–450)
RBC: 4.39 x10E6/uL (ref 4.14–5.80)
RDW: 13.8 % (ref 11.6–15.4)
WBC: 9.5 10*3/uL (ref 3.4–10.8)

## 2021-03-12 ENCOUNTER — Ambulatory Visit (INDEPENDENT_AMBULATORY_CARE_PROVIDER_SITE_OTHER): Payer: 59 | Admitting: Psychology

## 2021-03-12 ENCOUNTER — Telehealth: Payer: Self-pay

## 2021-03-12 DIAGNOSIS — F4322 Adjustment disorder with anxiety: Secondary | ICD-10-CM

## 2021-03-12 DIAGNOSIS — M310 Hypersensitivity angiitis: Secondary | ICD-10-CM

## 2021-03-12 MED ORDER — DOXYCYCLINE HYCLATE 100 MG PO TABS: 100.0000 mg | ORAL_TABLET | Freq: Two times a day (BID) | ORAL | 0 refills | Status: DC

## 2021-03-12 NOTE — Telephone Encounter (Signed)
-----   Message from Brendolyn Patty, MD sent at 03/10/2021  5:58 PM EDT ----- Lyme disease Western blot is equivocal, has 3 Ab bands, but needs 5 bands for positive, Due to annular configuration of rash which could be c/w clinical presentation of ECM disseminated disease, I would recommend starting doxycycline 100mg  PO bid with food/drink x 1 month, also repeat lyme's test [id number] (lyme serology with reflex), and he needs 1 month f/up appt with Dr. Raliegh Ip. please call pt

## 2021-03-12 NOTE — Telephone Encounter (Signed)
-----   Message from Ralene Bathe, MD sent at 03/05/2021  6:52 PM EDT ----- Thyroid studies from 03/04/21 show: TSH very slightly high - (10 days ago, TSH normal and 2 weeks ago TSH very HIGH. 56mos ago and 3 and 4 years ago TSH normal) T4; T3U and FTI all normal  Unsure of why significant elevation 2 weeks ago, but plan repeat in a few months. Pt saw his PCP, Dr [name] recently and he was screened for HIV = negative and referred to Rheumatology.  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 Keep appt with Rheumatology and PCP and Dermatology.

## 2021-03-12 NOTE — Telephone Encounter (Signed)
Advised patient of results/hd  

## 2021-03-12 NOTE — Telephone Encounter (Signed)
Advised patient of results. Sent in Doxycycline to [facility] and scheduled 1 month follow up/hd

## 2021-03-17 ENCOUNTER — Ambulatory Visit: Payer: 59 | Attending: Family Medicine | Admitting: Physical Therapy

## 2021-03-17 ENCOUNTER — Other Ambulatory Visit: Payer: Self-pay

## 2021-03-17 VITALS — BP 124/81 | HR 72

## 2021-03-17 DIAGNOSIS — M25511 Pain in right shoulder: Secondary | ICD-10-CM | POA: Diagnosis present

## 2021-03-17 DIAGNOSIS — M436 Torticollis: Secondary | ICD-10-CM | POA: Diagnosis present

## 2021-03-17 NOTE — Therapy (Signed)
[facility] [address] [place], [address] Phone: [phone]   Fax:  [phone]  Physical Therapy Evaluation  Patient Details  Name: Philip Cordova MRN: [id number] Date of Birth: 03-Sep-1965 Referring Provider (PT): Dr. 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     Past Surgical History:  Procedure Laterality Date   APPENDECTOMY  2008   COLONOSCOPY WITH PROPOFOL N/A 08/14/2020   Procedure: COLONOSCOPY WITH PROPOFOL;  Surgeon: Virgel Manifold, MD;  Location: [facility] ENDOSCOPY;  Service: Endoscopy;  Laterality: N/A;   DIAGNOSTIC LARYNGOSCOPY  06/17/2016   Procedure: DIAGNOSTIC LARYNGOSCOPY WITH BIOPSIES;  Surgeon: Carloyn Manner, MD;  Location: [facility] ORS;  Service: ENT;;   Moles removed  2005   [place] N/A 06/17/2016   Procedure: TONSILLECTOMY;  Surgeon: Carloyn Manner, MD;  Location: [facility] ORS;  Service: ENT;  Laterality: N/A;   TONSILLECTOMY      Vitals:   03/17/21 1520  BP: 124/81  Pulse: 72  SpO2: 100%      Subjective Assessment - 03/17/21 1507     Subjective Pt reports having increased left shoulder pain after doing dip at gym and he also has ongoing neck pain.    Pertinent History It sounds as though he may have nerve impingement resulting in spasm and numbness.  Prior imaging revealed facet arthropathy with some foraminal  narrowing.  His symptoms have improved with removing running and jumping exercises from his routine.  We will refer for physical therapy. Per PCP note 02/23/21    Limitations Other (comment)   Looking downwards and happens while running.   How long can you sit comfortably? N/a    How long can you stand comfortably? N/a    How long can you walk comfortably? 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 Left C3-4 uncovertebral  prominence with left foraminal narrowing. 03/04/20 CT Scan    Patient Stated Goals Wants to find what is causing neck pain    Currently in Pain? No/denies                  CERIVCAL TRIAGE    5 D's And 3 N's: Negative to all    denies diplopia, drop attacks, dysarthria, dysphagia, ataxia of  gait, and nystagmus; denies lightheadedness, numbness and occasional nausea    MUSCULOSKELETAL:   ROM (gross): - UE ROM:                               Cervical.  Normal               flex:   50          50                     ext:   60           60           R        L            Normal     SB:    30     40                    45  rot:     80     80                     70-90                               R      L    Normal   - shoulder flx     180 180          180  - shoulder abd    180 180         180  - shoulder ext     60    60             60  - combined mvt      (touch pt)         - ER       Occiput  Occiput  Occiput         - IR            T7        T7            T7 ER @ 90 deg abd   110 110            90 IR @ 90 deg abd    70   70              70   STRENGTH   Strength R/L 5/5 Shoulder flexion (anterior deltoid/pec major/coracobrachialis, axillary n. (C5/6) and musculocutaneous n. (C5-7))  5/5 Shoulder abduction (deltoid/supraspinatus, axillary/suprascapular n, C5)  5/5 Shoulder external rotation (infraspinatus/teres minor) *  5/5 Shoulder  internal rotation (subcapularis/lats/pec major)  5/5 Shoulder horizontal abduction 5/5 Elbow flexion (biceps brachii, brachialis, brachioradialis, musculoskeletal n, C5/6) 5/5 Elbow extension (triceps, radial n, C7)   ----------------------------  Special Tests:                       Cervical Radiculopathy Test cluster: Negative to All    -rotation AROM <60 deg towards symptomatic side?:   -Spurling's test:   -response to manual traction:   -upper limb tension test A:  ------------------------------------------------ IMAGING:   Skeleton: 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 Left C3-4 uncovertebral prominence with left foraminal narrowing. 03/04/20 CT Scan     PT Education - 03/17/21 1918     Education Details Pt educated on anatomy of SCM and given form/technique for appropriate exercise    Person(s) Educated Patient    Methods Explanation    Comprehension Verbalized understanding;Verbal cues required;Returned demonstration  PT Short Term Goals - 03/17/21 1932       PT SHORT TERM GOAL #1   Title Patient will demonstrate understanding and independence with home exercise plan.    Time 1    Period Weeks    Target Date 03/24/21               PT Long Term Goals - 03/17/21 1932       PT LONG TERM GOAL #1   Title Patient will have improved function and activity level as evidenced by an increase in FOTO score by 10 points or more.    Baseline 7/19: 56/74    Time 2    Period Weeks    Status New    Target Date 04/07/21      PT LONG TERM GOAL #2   Title Patient will demonstrate symmetrical cervical sidebending.    Baseline 7/19: R/ L- 30/40    Time 3    Period Weeks    Status New    Target Date 04/07/21      PT LONG TERM GOAL #3   Title Patient will demonstrate decreased R shoulder pain with resisted ER to <2/10.    Baseline 7/19: 2/10    Time 3    Period Weeks    Target Date 04/07/21                  Plan - 03/17/21 1920     Clinical Impression Statement Pt is a 55 yo male that presents to initial eval for neck pain and right shoulder pain. Pt demonstrates signs and symptoms suggestive of right SCM and left trap  hyperextensibility and grade I teres minor strain on right shoulder with pain with resisted ER on R shoulder and decreased cervical side bending to the right and pain with left cervical rotation. Given pt's limited number of deficits and his extensive knowledge and history with exercise, PT recommends limited follow-up apts to educate patient on home exercise plan to strengthen periscapular muscles and stretch cervical musculature and to address teres minor muscle strain, so pt can return to running without experiencing increased neck pain.    Personal Factors and Comorbidities Comorbidity 1    Comorbidities Prior history of cancer    Examination-Activity Limitations Other   running   Examination-Participation Restrictions Community Activity    Stability/Clinical Decision Making Stable/Uncomplicated    Clinical Decision Making Low    Rehab Potential Excellent    PT Frequency 2x / week    PT Duration 3 weeks    PT Treatment/Interventions Cryotherapy;Joint Manipulations;Spinal Manipulations;Dry needling;Manual techniques;Moist Heat;Traction    PT Next Visit Plan Review cervical stretches and persicapular strengthening exercises. Stretching teres minor with sleeper stretch and eccentric strengthening exercise    PT Home Exercise Plan [email or web address]            HEP includes:   Access Code: [email or web address] URL: [email or web address] Date: 03/17/2021 Prepared by: Bradly Chris  Exercises Seated Upper Trapezius Stretch - 1 x daily - 7 x weekly - 1 sets - 5 reps - 30 hold Seated Cervical Retraction - 1 x daily - 7 x weekly - 3 sets - 10 reps Sternocleidomastoid Stretch - 1 x daily - 7 x weekly - 1 sets - 5 reps - 30 hold  Patient will benefit from  skilled therapeutic intervention in order to improve the following deficits and impairments:  Hypermobility, Pain, Impaired flexibility, Hypomobility  Visit Diagnosis: Neck stiffness  Acute pain of right shoulder  Problem List Patient Active Problem List   Diagnosis Date Noted   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 [place], [address] Phone: [phone]   Fax:  [phone]  Name: Adyen Bifulco MRN: [id number] Date of Birth: [birth year]

## 2021-03-19 ENCOUNTER — Ambulatory Visit: Payer: 59 | Admitting: Physical Therapy

## 2021-03-19 DIAGNOSIS — M436 Torticollis: Secondary | ICD-10-CM | POA: Diagnosis not present

## 2021-03-19 DIAGNOSIS — M25511 Pain in right shoulder: Secondary | ICD-10-CM

## 2021-03-19 NOTE — Therapy (Signed)
[place] [facility] [address], [place], [address] Phone: [phone]   Fax:  [phone]  Physical Therapy Treatment  Patient Details  Name: Philip Cordova MRN: [id number] Date of Birth: 10/24/1965 Referring Provider (PT): Dr. 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     Past Surgical History:  Procedure Laterality Date   APPENDECTOMY  2008   COLONOSCOPY WITH PROPOFOL N/A 08/14/2020   Procedure: COLONOSCOPY WITH PROPOFOL;  Surgeon: Virgel Manifold, MD;  Location: [facility] ENDOSCOPY;  Service: Endoscopy;  Laterality: N/A;   DIAGNOSTIC LARYNGOSCOPY  06/17/2016   Procedure: DIAGNOSTIC LARYNGOSCOPY WITH BIOPSIES;  Surgeon: Carloyn Manner, MD;  Location: [facility] ORS;  Service: ENT;;   Moles removed  2005   [place] N/A 06/17/2016   Procedure: TONSILLECTOMY;  Surgeon: Carloyn Manner, MD;  Location: [facility] ORS;  Service: ENT;  Laterality: N/A;   TONSILLECTOMY      There were no vitals filed for this visit.   Subjective Assessment - 03/19/21 1634     Subjective Describes being able to exercises but has difficulty with some of them.    Pertinent History It sounds as though he may have nerve impingement resulting in spasm and numbness.  Prior imaging revealed facet arthropathy with some foraminal narrowing.  His symptoms have improved with removing running and  jumping exercises from his routine.  We will refer for physical therapy. Per PCP note 02/23/21    Limitations Other (comment)   Looking downwards and happens while running.   How long can you sit comfortably? N/a    How long can you stand comfortably? N/a    How long can you walk comfortably? 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 Left C3-4 uncovertebral  prominence with left foraminal narrowing. 03/04/20 CT Scan    Patient Stated Goals Wants to find what is causing neck pain    Currently in Pain? No/denies             THEREX:  Reviewed SCM stretch and Upper Trap Stretch   Levator Scap Stretch 2 x 60 sec  Cross Body Stretch 2 x 60 sec  Sleeper Stretch 2 x 60 sec   Plan with scapular protraction 1 x 10  - Pt has difficulty getting into positoon  Sky Punches 3 x 15   Single Arm Shoulder ER at 90 degree Abduction 1 x 10 with red theraband  -VC and TC for 90 deg shoulder abduction and to perform IR slowly   Updated HEP and educated patient on changes to exercises and addition of new exercises      PT  Education - 03/20/21 1225     Education Details form/technqiue with exercises    Person(s) Educated Patient    Methods Explanation    Comprehension Verbalized understanding;Returned demonstration;Verbal cues required;Tactile cues required              PT Short Term Goals - 03/20/21 1231       PT SHORT TERM GOAL #1   Title Patient will demonstrate understanding and independence with home exercise plan.    Time 1    Period Weeks    Target Date 03/24/21               PT Long Term Goals - 03/20/21 1231       PT LONG TERM GOAL #1   Title Patient will have improved function and activity level as evidenced by an increase in FOTO score by 10 points or more.    Baseline 7/19: 56/74    Time 2    Period Weeks    Status New      PT LONG TERM GOAL #2   Title Patient will  demonstrate symmetrical cervical sidebending.    Baseline 7/19: R/ L- 30/40    Time 3    Period Weeks    Status New      PT LONG TERM GOAL #3   Title Patient will demonstrate decreased R shoulder pain with resisted ER to <2/10.    Baseline 7/19: 2/10    Time 3    Period Weeks                 Plan - 03/20/21 1230     Clinical Impression Statement Pt presents for f/u for cervical hypomobility and right shoulder strain. He demonstrates what appears to be a teres minor and infraspinatus strain as evidenced by his relief from sleeper stretch. He was able to tolerate all exercises without an increase in his pain including periscapular strengthening exercises and eccentric shoulder ER exercises. Pt already has a number of shoulder exercises that he regularly completes, so he has almost received all exercises for HEP to self-manage condition and he will likely only need a few more visits to complete. Pt will benefit from skille PT to increase cervical ROM and decrease right shoulder pain in order to return to his regular high intensity exercise routine.    Personal Factors and Comorbidities Comorbidity 1    Comorbidities Prior history of cancer    Examination-Activity Limitations Other   running   Examination-Participation Restrictions Community Activity    Stability/Clinical Decision Making Stable/Uncomplicated    Rehab Potential Excellent    PT Frequency 2x / week    PT Duration 3 weeks    PT Treatment/Interventions Cryotherapy;Joint Manipulations;Spinal Manipulations;Dry needling;Manual techniques;Moist Heat;Traction    PT Next Visit Plan Review cervical stretches and persicapular strengthening exercises. Stretching teres minor with sleeper stretch and eccentric strengthening exercise    PT Home Exercise Plan [email or web address]            HEP includes:  Access Code: [email or web address] URL: [email or web address] Date: 03/19/2021 Prepared by: Bradly Chris  Exercises Seated Upper  Trapezius Stretch - 1 x daily - 7 x weekly - 1 sets - 5 reps - 30 hold Seated Cervical Retraction - 1 x daily - 7 x weekly - 3 sets - 10 reps Sternocleidomastoid Stretch - 1 x daily - 7 x weekly - 1 sets - 5 reps - 30 hold Sleeper Stretch - 1 x daily - 7 x weekly - 1 sets -  5 reps - 60 hold Supine Scapular Protraction in Flexion with Dumbbells - 1 x daily - 7 x weekly - 3 sets - 15 reps Seated Levator Scapulae Stretch - 1 x daily - 7 x weekly - 1 sets - 5 reps - 60 hold Standing Single Arm Shoulder External Rotation in Abduction with Anchored Resistance - 1 x daily - 3 x weekly - 3 sets - 10 reps  Patient will benefit from skilled therapeutic intervention in order to improve the following deficits and impairments:  Hypermobility, Pain, Impaired flexibility, Hypomobility  Visit Diagnosis: Neck stiffness  Acute pain of right shoulder     Problem List Patient Active Problem List   Diagnosis Date Noted   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   Elevated TSH 02/23/2021   Neck pain 02/23/2021   Neuropathy 02/23/2021   Herpes 12/16/2020   Rectal polyp    Polyp of ascending colon    Encounter for general adult medical examination with abnormal findings 05/16/2020   BRBPR (bright red blood per rectum) 05/16/2020   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 [place], [address] Phone: [phone]   Fax:  [phone]  Name: Auguste Tebbetts MRN: [id number] Date of Birth: December 03, 1965

## 2021-03-22 ENCOUNTER — Other Ambulatory Visit: Payer: Self-pay | Admitting: Family Medicine

## 2021-03-23 MED ORDER — VALACYCLOVIR HCL 1 G PO TABS: ORAL_TABLET | ORAL | 0 refills | Status: DC

## 2021-03-24 ENCOUNTER — Telehealth: Payer: Self-pay | Admitting: Physical Therapy

## 2021-03-24 ENCOUNTER — Encounter: Payer: 59 | Admitting: Physical Therapy

## 2021-03-24 ENCOUNTER — Ambulatory Visit: Payer: 59 | Admitting: Physical Therapy

## 2021-03-24 NOTE — Telephone Encounter (Signed)
Called pt to check in about his absence to apt. Pt did not pickup call, so left VM to call back and check in.

## 2021-03-26 ENCOUNTER — Ambulatory Visit: Payer: 59 | Admitting: Physical Therapy

## 2021-03-26 ENCOUNTER — Encounter: Payer: 59 | Admitting: Physical Therapy

## 2021-03-26 DIAGNOSIS — M436 Torticollis: Secondary | ICD-10-CM | POA: Diagnosis not present

## 2021-03-26 DIAGNOSIS — M25511 Pain in right shoulder: Secondary | ICD-10-CM

## 2021-03-26 NOTE — Therapy (Addendum)
[facility] [address], [place], [address] Phone: [phone]   Fax:  [phone]  Physical Therapy Treatment  Patient Details  Name: Philip Cordova MRN: [id number] Date of Birth: February 22, 1966 Referring Provider (PT): Dr. [name]   Encounter Date: 03/26/2021    Past Medical History:  Diagnosis Date   Asthma    Athlete's heart    STATES HR STAYS AROUND 46   Chickenpox    Squamous cell carcinoma of head and neck ([employer]) 04/09/2016   Surgical resection, chemo + rad tx's.     Past Surgical History:  Procedure Laterality Date   APPENDECTOMY  2008   COLONOSCOPY WITH PROPOFOL N/A 08/14/2020   Procedure: COLONOSCOPY WITH PROPOFOL;  Surgeon: Virgel Manifold, MD;  Location: [facility] END[facility]COPY;  Service: Endoscopy;  Laterality: N/A;   DIAGNOSTIC LARYNGOSCOPY  06/17/2016   Procedure: DIAGNOSTIC LARYNGOSCOPY WITH BIOPSIES;  Surgeon: Carloyn Manner, MD;  Location: [facility] ORS;  Service: ENT;;   Moles removed  2005   [place] N/A 06/17/2016   Procedure: TONSILLECTOMY;  Surgeon: Carloyn Manner, MD;  Location: [facility] ORS;  Service: ENT;  Laterality: N/A;   TONSILLECTOMY      There were no vitals filed for this visit.   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    Pertinent History It sounds as though he may have nerve impingement resulting in spasm and numbness.  Prior imaging revealed facet arthropathy with some foraminal narrowing.  His symptoms have improved with removing running and jumping exercises from his routine.  We will refer for physical therapy. Per PCP note 02/23/21    Limitations Other (comment)   Looking downwards and happens while running.   How long can you sit comfortably? N/a    How long can you stand comfortably? N/a    How long can you walk comfortably? 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 Left C3-4 uncovertebral  prominence with left foraminal narrowing. 03/04/20 CT Scan    Patient Stated Goals Wants to find what is causing neck pain            EVAL:   Pt reports feeling pain with shoulder IR   Resisted Upper, Mid, and Lower Pec MMT  -No pain elicited.    THEREX:  Upper Trap Stretch 2 x 30 sec  Levator Scap Stretch 2 x 30 sec  -mod VC to look at pocket for visual cue   Serratus Punches with #8 DB  -mod VC to maintain elbow extension   Anterior Delt Stretch with Band 2 x 60 sec   Reviewed HEP because pt has not been able to complete at home because of busy schedule.           PT Short Term Goals - 03/26/21 1606       PT SHORT TERM GOAL #1   Title Patient will demonstrate understanding and independence with home exercise plan.    Time 1    Period Weeks    Target Date 03/24/21               PT Long Term Goals - 03/26/21 1606       PT LONG TERM GOAL #1   Title Patient will have improved  function and activity level as evidenced by an increase in FOTO score by 10 points or more.    Baseline 7/19: 56/74    Time 2    Period Weeks    Status New      PT LONG TERM GOAL #2   Title Patient will demonstrate symmetrical cervical sidebending.    Baseline 7/19: R/ L- 30/40    Time 3    Period Weeks    Status New      PT LONG TERM GOAL #3   Title Patient will demonstrate decreased R shoulder pain with resisted ER to <2/10.    Baseline 7/19: 2/10    Time 3    Period Weeks            HEP includes same exercises from last week including anterior deltoid stretch.    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, [place], [address] Phone: [phone]   Fax:  [phone]  Name: Philip Cordova MRN: [id number] Date of Birth: 1965-10-01

## 2021-03-30 ENCOUNTER — Telehealth: Payer: Self-pay | Admitting: Family Medicine

## 2021-03-30 NOTE — Telephone Encounter (Signed)
[place] for patient to receive both injections?

## 2021-03-30 NOTE — Telephone Encounter (Signed)
Patient would like to get his pneumococcal and shingles injection. Do orders need to be in before scheduling?

## 2021-03-30 NOTE — Telephone Encounter (Signed)
It looks like he had 1 shingles vaccine previously.  Please confirm that he did not have a second Shingrix vaccine.  He can receive the Prevnar 20.  If he has not had a second Shingrix vaccine he can be scheduled for the Shingrix vaccine as well.

## 2021-03-31 ENCOUNTER — Encounter: Payer: 59 | Admitting: Physical Therapy

## 2021-03-31 NOTE — Telephone Encounter (Signed)
He does not need to restart the series.

## 2021-03-31 NOTE — Telephone Encounter (Signed)
Patient thought he had both but is unsure. He has been scheduled a nurse visit for Prevnar 20 and shingrix. Will patient need to restart series?

## 2021-04-02 ENCOUNTER — Ambulatory Visit (INDEPENDENT_AMBULATORY_CARE_PROVIDER_SITE_OTHER): Payer: 59

## 2021-04-02 ENCOUNTER — Encounter: Payer: 59 | Admitting: Physical Therapy

## 2021-04-02 ENCOUNTER — Other Ambulatory Visit: Payer: Self-pay

## 2021-04-02 DIAGNOSIS — Z23 Encounter for immunization: Secondary | ICD-10-CM

## 2021-04-02 NOTE — Progress Notes (Signed)
Patient presented for Shingrix and Prevnar 20 injection to left and right deltoid, patient voiced no concerns nor showed any signs of distress during injection.

## 2021-04-07 ENCOUNTER — Encounter: Payer: 59 | Admitting: Physical Therapy

## 2021-04-09 ENCOUNTER — Encounter: Payer: 59 | Admitting: Physical Therapy

## 2021-04-13 ENCOUNTER — Other Ambulatory Visit: Payer: Self-pay

## 2021-04-13 ENCOUNTER — Ambulatory Visit (INDEPENDENT_AMBULATORY_CARE_PROVIDER_SITE_OTHER): Payer: 59 | Admitting: Dermatology

## 2021-04-13 DIAGNOSIS — L821 Other seborrheic keratosis: Secondary | ICD-10-CM

## 2021-04-13 DIAGNOSIS — M310 Hypersensitivity angiitis: Secondary | ICD-10-CM | POA: Diagnosis not present

## 2021-04-13 NOTE — Patient Instructions (Signed)

## 2021-04-13 NOTE — Progress Notes (Signed)
   Follow-Up Visit   Subjective  Philip Cordova is a 55 y.o. male who presents for the following: Follow-up (Patient here for 2 month follow up rash at chest. Patient advises chest has been clear for about 1 week. Patient saw rheumatologist and was put on hydroxychloroquine for vasculitis component -biopsy-proven. ). The patient has done well and has been mostly clear for the last week or so.  Patient's labs showed Lyme disease Western blot is equivocal, has 3 Ab bands, but needs 5 bands for positive, Due to annular configuration of rash which could be c/w clinical presentation of ECM disseminated disease patient was put on doxycycline '100mg'$  twice daily for 1 month. His last dose is tomorrow.  The following portions of the chart were reviewed this encounter and updated as appropriate:   Tobacco  Allergies  Meds  Problems  Med Hx  Surg Hx  Fam Hx     Review of Systems:  No other skin or systemic complaints except as noted in HPI or Assessment and Plan.  Objective  Well appearing patient in no apparent distress; mood and affect are within normal limits.  A focused examination was performed including legs, chest. Relevant physical exam findings are noted in the Assessment and Plan.  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  Patient empirically treated with oral doxycycline 100 twice daily for a month due to these results and his clinical picture of annular plaques on the chest.  He is almost finished with this course. Chest Biopsy proven Rash clear today Continue doxycycline '100mg'$  twice daily until course is completed Continue hydroxychloroquine '200mg'$  twice daily as prescribed by rheumatologist. Patient following up with Dr. Posey Pronto -rheumatologist in 4 months.  Complicated condition  Seborrheic Keratoses - Stuck-on, waxy, tan-brown papules and/or  plaques  - Benign-appearing - Discussed benign etiology and prognosis. - Observe - Call for any changes  Return for TBSE, as scheduled.  Graciella Belton, RMA, am acting as scribe for Sarina Ser, MD . Documentation: I have reviewed the above documentation for accuracy and completeness, and I agree with the above.  Sarina Ser, MD

## 2021-04-18 ENCOUNTER — Encounter: Payer: Self-pay | Admitting: Dermatology

## 2021-04-22 ENCOUNTER — Ambulatory Visit: Payer: 59 | Admitting: Psychology

## 2021-04-23 ENCOUNTER — Other Ambulatory Visit: Payer: Self-pay | Admitting: Family Medicine

## 2021-04-23 MED ORDER — VALACYCLOVIR HCL 1 G PO TABS: ORAL_TABLET | ORAL | 0 refills | Status: DC

## 2021-04-24 ENCOUNTER — Ambulatory Visit (INDEPENDENT_AMBULATORY_CARE_PROVIDER_SITE_OTHER): Payer: 59 | Admitting: Psychology

## 2021-04-24 DIAGNOSIS — F4322 Adjustment disorder with anxiety: Secondary | ICD-10-CM | POA: Diagnosis not present

## 2021-04-29 ENCOUNTER — Ambulatory Visit (INDEPENDENT_AMBULATORY_CARE_PROVIDER_SITE_OTHER): Payer: 59 | Admitting: Psychology

## 2021-04-29 DIAGNOSIS — F4322 Adjustment disorder with anxiety: Secondary | ICD-10-CM

## 2021-05-19 ENCOUNTER — Ambulatory Visit: Payer: 59 | Admitting: Psychology

## 2021-05-27 ENCOUNTER — Ambulatory Visit (INDEPENDENT_AMBULATORY_CARE_PROVIDER_SITE_OTHER): Payer: 59 | Admitting: Family Medicine

## 2021-05-27 ENCOUNTER — Other Ambulatory Visit: Payer: Self-pay

## 2021-05-27 ENCOUNTER — Encounter: Payer: Self-pay | Admitting: Family Medicine

## 2021-05-27 VITALS — BP 125/70 | HR 94 | Temp 98.1°F | Ht 73.0 in | Wt 196.6 lb

## 2021-05-27 DIAGNOSIS — Z13220 Encounter for screening for lipoid disorders: Secondary | ICD-10-CM

## 2021-05-27 DIAGNOSIS — B009 Herpesviral infection, unspecified: Secondary | ICD-10-CM | POA: Diagnosis not present

## 2021-05-27 DIAGNOSIS — M542 Cervicalgia: Secondary | ICD-10-CM

## 2021-05-27 DIAGNOSIS — Z79899 Other long term (current) drug therapy: Secondary | ICD-10-CM

## 2021-05-27 DIAGNOSIS — K625 Hemorrhage of anus and rectum: Secondary | ICD-10-CM | POA: Diagnosis not present

## 2021-05-27 DIAGNOSIS — Z125 Encounter for screening for malignant neoplasm of prostate: Secondary | ICD-10-CM | POA: Diagnosis not present

## 2021-05-27 DIAGNOSIS — M310 Hypersensitivity angiitis: Secondary | ICD-10-CM

## 2021-05-27 DIAGNOSIS — F439 Reaction to severe stress, unspecified: Secondary | ICD-10-CM

## 2021-05-27 LAB — CBC
HCT: 39 % (ref 39.0–52.0)
Hemoglobin: 12.5 g/dL — ABNORMAL LOW (ref 13.0–17.0)
MCHC: 32 g/dL (ref 30.0–36.0)
MCV: 84.6 fl (ref 78.0–100.0)
Platelets: 288 10*3/uL (ref 150.0–400.0)
RBC: 4.61 Mil/uL (ref 4.22–5.81)
RDW: 19.3 % — ABNORMAL HIGH (ref 11.5–15.5)
WBC: 7.5 10*3/uL (ref 4.0–10.5)

## 2021-05-27 LAB — LIPID PANEL
Cholesterol: 137 mg/dL (ref 0–200)
HDL: 40.6 mg/dL (ref >39.00)
LDL Cholesterol: 79 mg/dL (ref 0–99)
NonHDL: 95.91
Total CHOL/HDL Ratio: 3
Triglycerides: 86 mg/dL (ref 0.0–149.0)
VLDL: 17.2 mg/dL (ref 0.0–40.0)

## 2021-05-27 LAB — PSA: PSA: 0.78 ng/mL (ref 0.10–4.00)

## 2021-05-27 MED ORDER — VALACYCLOVIR HCL 1 G PO TABS: ORAL_TABLET | ORAL | 0 refills | Status: DC

## 2021-05-27 NOTE — Assessment & Plan Note (Signed)
Much improved on Plaquenil.  He will continue management of this issue through rheumatology.  Referral to ophthalmology placed.

## 2021-05-27 NOTE — Assessment & Plan Note (Signed)
Significant stress with home life.  I offered support.  He will monitor.

## 2021-05-27 NOTE — Patient Instructions (Addendum)
Nice to see you. We are going to check some lab work today. Somebody should call you  to schedule an eye doctor visit. I refilled your Valtrex.
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
## 2021-05-27 NOTE — Assessment & Plan Note (Signed)
Possibly related to a hemorrhoid.  This has resolved at this time.  He did have a colonoscopy last year that was relatively reassuring.  Discussed having him see somebody to take care of the apparent hemorrhoid though he notes he does not have time right now.  He will let me know when this occurs.  We will check a CBC.

## 2021-05-27 NOTE — Assessment & Plan Note (Signed)
Resolved.  He will monitor for recurrence.

## 2021-05-27 NOTE — Assessment & Plan Note (Signed)
More frequent outbreaks with his recent stressors.  We will refill his Valtrex.  If not improving with improved stress he will let us know.

## 2021-05-28 ENCOUNTER — Other Ambulatory Visit: Payer: Self-pay | Admitting: Family Medicine

## 2021-05-28 ENCOUNTER — Other Ambulatory Visit (INDEPENDENT_AMBULATORY_CARE_PROVIDER_SITE_OTHER): Payer: 59

## 2021-05-28 ENCOUNTER — Other Ambulatory Visit: Payer: Self-pay | Admitting: Dermatology

## 2021-05-28 DIAGNOSIS — D649 Anemia, unspecified: Secondary | ICD-10-CM | POA: Diagnosis not present

## 2021-05-28 LAB — IBC + FERRITIN
Ferritin: 16.3 ng/mL — ABNORMAL LOW (ref 22.0–322.0)
Iron: 206 ug/dL — ABNORMAL HIGH (ref 42–165)
Saturation Ratios: 53.7 % — ABNORMAL HIGH (ref 20.0–50.0)
TIBC: 383.6 ug/dL (ref 250.0–450.0)
Transferrin: 274 mg/dL (ref 212.0–360.0)

## 2021-05-29 ENCOUNTER — Other Ambulatory Visit: Payer: Self-pay | Admitting: Family Medicine

## 2021-05-29 DIAGNOSIS — D500 Iron deficiency anemia secondary to blood loss (chronic): Secondary | ICD-10-CM

## 2021-05-29 DIAGNOSIS — K625 Hemorrhage of anus and rectum: Secondary | ICD-10-CM

## 2021-05-29 NOTE — Telephone Encounter (Signed)
Patient states that he doesn't need the Doxycycline refilled, he needs the Plaquenil refilled. Advised him to contact his rheumatologist Dr. Posey Pronto since he is the prescribing provider.

## 2021-05-31 ENCOUNTER — Encounter: Payer: Self-pay | Admitting: Family Medicine

## 2021-06-05 ENCOUNTER — Ambulatory Visit (INDEPENDENT_AMBULATORY_CARE_PROVIDER_SITE_OTHER): Payer: 59 | Admitting: Psychology

## 2021-06-05 DIAGNOSIS — F4322 Adjustment disorder with anxiety: Secondary | ICD-10-CM

## 2021-06-08 ENCOUNTER — Ambulatory Visit (INDEPENDENT_AMBULATORY_CARE_PROVIDER_SITE_OTHER): Payer: 59 | Admitting: Gastroenterology

## 2021-06-08 ENCOUNTER — Telehealth: Payer: Self-pay | Admitting: Family Medicine

## 2021-06-08 ENCOUNTER — Other Ambulatory Visit: Payer: Self-pay

## 2021-06-08 DIAGNOSIS — K648 Other hemorrhoids: Secondary | ICD-10-CM

## 2021-06-08 DIAGNOSIS — D509 Iron deficiency anemia, unspecified: Secondary | ICD-10-CM

## 2021-06-08 DIAGNOSIS — J45909 Unspecified asthma, uncomplicated: Secondary | ICD-10-CM

## 2021-06-08 MED ORDER — FLUTICASONE-SALMETEROL 250-50 MCG/ACT IN AEPB: 1.0000 | INHALATION_SPRAY | Freq: Two times a day (BID) | RESPIRATORY_TRACT | 2 refills | Status: DC

## 2021-06-08 NOTE — Telephone Encounter (Signed)
Patient is calling in to request a refill on his fluticasone-salmeterol (ADVAIR DISKUS) 250-50 MCG/ACT AEPB.Please send to the [facility] on [address].

## 2021-06-08 NOTE — Progress Notes (Signed)
Vonda Antigua, MD [address]  [place]  [place], [place] [address]  Main: [phone]  Fax: [phone]   Primary Care Physician: [name], MD   Chief Complaint  Patient presents with   New Patient (Initial Visit)    Colonoscopy 07/2020   Iron Deficiency Anemia    Taking iron QD.... last labs IBC/Ferritin 05/28/2021   Hemorrhoids    Pt reports he is usually able to just push the hemorrhoid back in and it helps, this last time he had a lot of bleeding.... Denies any ongoing rectal pain or itching     HPI: Philip Cordova is a 55 y.o. male here for follow-up and is reporting intermittent bright red blood per rectum that has recently worsened.  Reports increased test at home recently and had to do a lot of heavy lifting recently due to having to move his mother from her house to an apartment.  During this time he noted bright red blood per rectum, and describes a day where he had about one quarter to half a cup of blood dripping from his rectum.  Reports eating 75 g of fiber in a day and mostly having soft stools.  On last visit in December 2021 he had reported intermittent bright red blood per rectum.  He had undergone screening colonoscopy and internal hemorrhoids were seen.  Steroid suppositories were prescribed but patient states he only took it for 1 day as he does not like taking medications.  However, he did not call and inform us of this.  He was recently seen by primary care provider, Dr. [name].  Note reviewed and they ordered lab tests due to his symptoms.  Hemoglobin was noted to be mildly low at 12.5, with ferritin of 16.3.  CMP/liver enzymes recently normal  Patient denies any abdominal pain, nausea or vomiting, dysphagia, heartburn, weight loss.  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     Past Surgical History:  Procedure Laterality Date   APPENDECTOMY  2008   COLONOSCOPY WITH PROPOFOL N/A 08/14/2020   Procedure: COLONOSCOPY WITH PROPOFOL;  Surgeon: Virgel Manifold, MD;  Location: [facility] ENDOSCOPY;  Service: Endoscopy;  Laterality: N/A;   DIAGNOSTIC LARYNGOSCOPY  06/17/2016   Procedure: DIAGNOSTIC LARYNGOSCOPY WITH BIOPSIES;  Surgeon: Carloyn Manner, MD;  Location: [facility] ORS;  Service: ENT;;   Moles removed  2005   [place] N/A 06/17/2016   Procedure: TONSILLECTOMY;  Surgeon: Carloyn Manner, MD;  Location: [facility] ORS;  Service: ENT;  Laterality: N/A;   TONSILLECTOMY      Prior to Admission medications   Medication Sig Start Date End Date Taking? Authorizing Provider  albuterol (PROVENTIL HFA) 108 (90 Base) MCG/ACT inhaler INHALE 1 PUFF INTO THE LUNGS EVERY 6 HOURS AS NEEDED FOR WHEEZING. 05/16/20  Yes [name], MD  EPINEPHrine 0.3 mg/0.3 mL IJ SOAJ injection Inject into the muscle. 07/02/20  Yes [provider]  Ferrous Sulfate (IRON) 90 (18 Fe) MG TABS Take by mouth.   Yes [provider]  fluticasone-salmeterol (ADVAIR DISKUS) 250-50 MCG/ACT AEPB Inhale 1 puff into the lungs in the morning and at bedtime. Rinse mouth further refills PCP 03/04/21  Yes McLean-Scocuzza, Nino Glow, MD  Multiple Vitamins-Minerals (MULTIVITAMIN WITH MINERALS) tablet Take by mouth. 11/21/08  Yes [provider]  neomycin-polymyxin-hydrocortisone (CORTISPORIN) OTIC solution Place 4 drops into both ears 3 (three) times daily. 08/09/18  Yes Guse, [name], FNP  Omega-3 Fatty Acids (FISH OIL) 1000 MG CAPS Take 1 capsule by mouth daily.    Yes [provider]  valACYclovir (VALTREX) 1000 MG tablet Take 1 tablet by mouth once daily for 5 days at first sign of outbreak 05/27/21  Yes [name], MD    Family History  Problem Relation Age of Onset   Cancer  Father        Skin   Hyperlipidemia Father    Hypertension Father        Possible   Diabetes Paternal Uncle      Social History   Tobacco Use   Smoking status: Former    Years: 1.00    Types: Cigarettes   Smokeless tobacco: Never   Tobacco comments:    "YEARS AGO"  Vaping Use   Vaping Use: Never used  Substance Use Topics   Alcohol use: No    Comment: Occasionally   Drug use: No    Allergies as of 06/08/2021 - Review Complete 06/08/2021  Allergen Reaction Noted   Cat hair extract Other (See Comments) 03/25/2021   Milk-related compounds  03/23/2012    Physical Examination:  Constitutional: General:   Alert,  Well-developed, well-nourished, pleasant and cooperative in NAD There were no vitals taken for this visit.  Respiratory: Normal respiratory effort  Gastrointestinal:  Soft, non-tender and non-distended without masses, hepatosplenomegaly or hernias noted.  No guarding or rebound tenderness.     Cardiac: No clubbing or edema.  No cyanosis. Normal posterior tibial pedal pulses noted.  Psych:  Alert and cooperative. Normal mood and affect.  Musculoskeletal:  Normal gait. Head normocephalic, atraumatic. Symmetrical without gross deformities. 5/5 Lower extremity strength bilaterally.  Skin: Warm. Intact without significant lesions or rashes. No jaundice.  Neck: Supple, trachea midline  Lymph: No cervical lymphadenopathy  Psych:  Alert and oriented x3, Alert and cooperative. Normal mood and affect.  Labs: CMP     Component Value Date/Time   NA 139 02/18/2021 1047   K 4.1 02/18/2021 1047   CL 102 02/18/2021 1047   CO2 23 02/18/2021 1047   GLUCOSE 93 02/18/2021 1047   GLUCOSE 85 05/16/2020 1537   BUN 21 02/18/2021 1047   CREATININE 1.13 02/18/2021 1047   CREATININE 1.17 05/16/2020 1537   CALCIUM 9.2 02/18/2021 1047   PROT 6.9 02/18/2021 1047   ALBUMIN 4.5 02/18/2021 1047   AST 24 02/18/2021 1047   ALT 16 02/18/2021 1047   ALKPHOS 67 02/18/2021 1047    BILITOT 0.3 02/18/2021 1047   GFRNONAA >60 03/30/2018 0846   GFRAA >60 03/30/2018 0846   Lab Results  Component Value Date   WBC 7.5 05/27/2021   HGB 12.5 (L) 05/27/2021   HCT 39.0 05/27/2021   MCV 84.6 05/27/2021   PLT 288.0 05/27/2021    Imaging Studies:   Assessment and Plan:   Philip Cordova is a 55 y.o. y/o male with history of internal hemorrhoids seen on recent colonoscopy in December 2021, with intermittent bright red blood per rectum  Given ongoing symptoms despite high-fiber diet, and patient noncompliant with previous steroid suppository treatment, we discussed hemorrhoid banding.  Patient is agreeable to hemorrhoid banding and will be scheduled with Dr. Vicente Males or Dr. Marius Ditch  Once intermittent bright  red blood per rectum improves, recheck hemoglobin and ferritin to ensure that it has improved.  If not, patient may need EGD.  However, he is not otherwise having any upper GI symptoms and given the amount of bright red blood per rectum he has been having, anemia and low ferritin can be explained by recent symptoms and therefore no indication for urgent EGD at this time  Follow-up with PCP as scheduled.  Follow-up in clinic in 2 to 3 months.  Follow-up with Dr. Vicente Males or Dr. Marius Ditch for banding  High-fiber diet encouraged     Dr Vonda Antigua

## 2021-06-08 NOTE — Telephone Encounter (Signed)
Medication has been refilled.

## 2021-06-11 LAB — HM DIABETES EYE EXAM

## 2021-06-12 ENCOUNTER — Ambulatory Visit (INDEPENDENT_AMBULATORY_CARE_PROVIDER_SITE_OTHER): Payer: 59 | Admitting: Psychology

## 2021-06-12 DIAGNOSIS — F4322 Adjustment disorder with anxiety: Secondary | ICD-10-CM

## 2021-06-17 ENCOUNTER — Ambulatory Visit: Payer: 59 | Admitting: Psychology

## 2021-06-19 ENCOUNTER — Ambulatory Visit (INDEPENDENT_AMBULATORY_CARE_PROVIDER_SITE_OTHER): Payer: 59 | Admitting: Psychology

## 2021-06-19 DIAGNOSIS — F4322 Adjustment disorder with anxiety: Secondary | ICD-10-CM | POA: Diagnosis not present

## 2021-06-25 ENCOUNTER — Ambulatory Visit (INDEPENDENT_AMBULATORY_CARE_PROVIDER_SITE_OTHER): Payer: 59 | Admitting: Gastroenterology

## 2021-06-25 ENCOUNTER — Encounter: Payer: Self-pay | Admitting: Gastroenterology

## 2021-06-25 ENCOUNTER — Other Ambulatory Visit: Payer: Self-pay

## 2021-06-25 VITALS — BP 120/61 | HR 85 | Temp 98.9°F | Ht 73.0 in | Wt 202.6 lb

## 2021-06-25 DIAGNOSIS — K642 Third degree hemorrhoids: Secondary | ICD-10-CM | POA: Diagnosis not present

## 2021-06-25 DIAGNOSIS — D500 Iron deficiency anemia secondary to blood loss (chronic): Secondary | ICD-10-CM

## 2021-06-25 DIAGNOSIS — K625 Hemorrhage of anus and rectum: Secondary | ICD-10-CM

## 2021-06-25 NOTE — Progress Notes (Signed)
Cephas Darby, MD [address]  [place]  [place], [place] [address]  Main: [phone]  Fax: [phone]    Gastroenterology Consultation  Referring Provider:     [name], MD Primary Care Physician:  [name], MD Primary Gastroenterologist:  Dr. Bonna Gains Reason for Consultation:   Painless rectal bleeding, iron deficiency anemia        HPI:   Philip Cordova is a 55 y.o. male referred by Dr. [name], MD  for consultation & management of chronic history of intermittent painless rectal bleeding leading to iron deficiency anemia.  Patient has history of prolapsed internal hemorrhoids for several years.  Patient was seen by Dr. Bonna Gains in early October due to worsening of rectal bleeding, he described it as stream of blood per rectum.  He was going through a lot of stress moving her mom to a retirement home and dealing with her properties and financial matters and was not concentrating on his diet and overall health.  He reports that the severity of bleeding has decreased, still has ongoing bright red blood per rectum.  He is referred to me to evaluate for hemorrhoid banding.  Patient reports that he has to manually reduce the hemorrhoids every time he has a bowel movement.  He spends about 15 to 20 minutes during each bowel movement, goes up to 4-5 daily.  He consumes about 50 to 75 g of fiber daily, he does not eat any processed foods, avoids daily.  He believes in organic foods.  He is quite active, able to maintain weight.  He is taking oral iron for iron deficiency anemia.  Patient had history of head and neck cancer, s/p surgical resection, chemo XRT in 2017  Patient denies any other hemorrhoidal symptoms  Patient does not smoke or drink alcohol  NSAIDs: None  Antiplts/Anticoagulants/Anti thrombotics: None  Patient denies family history of GI malignancy  GI Procedures:  Colonoscopy 08/14/2020 - Three 5 to 8 mm polyps in the sigmoid colon  and in the ascending colon, removed with a cold snare. Resected and retrieved. - One 4 mm polyp in the rectum, removed with a cold biopsy forceps. Resected and retrieved. - Diverticulosis in the ascending colon. - The examination was otherwise normal. - The rectum, sigmoid colon, descending colon, transverse colon, ascending colon and cecum are normal. - Non-bleeding internal hemorrhoids.  DIAGNOSIS:  A. COLON POLYP X4, ASCENDING, SIGMOID, AND RECTUM; COLD BIOPSY AND COLD  SNARE:  - FEATURES SUGGESTIVE OF SESSILE SERRATED POLYP, ONE FRAGMENT.  - HYPERPLASTIC POLYP, MULTIPLE FRAGMENTS.  - NEGATIVE FOR DYSPLASIA AND MALIGNANCY.    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     Past Surgical History:  Procedure Laterality Date   APPENDECTOMY  2008   COLONOSCOPY WITH PROPOFOL N/A 08/14/2020   Procedure: COLONOSCOPY WITH PROPOFOL;  Surgeon: Virgel Manifold, MD;  Location: [facility];  Service: Endoscopy;  Laterality: N/A;   DIAGNOSTIC LARYNGOSCOPY  06/17/2016   Procedure: DIAGNOSTIC LARYNGOSCOPY WITH BIOPSIES;  Surgeon: Carloyn Manner, MD;  Location: [facility] ORS;  Service: ENT;;   Moles removed  2005   [place] N/A 06/17/2016   Procedure: TONSILLECTOMY;  Surgeon: Carloyn Manner, MD;  Location: [facility] ORS;  Service: ENT;  Laterality: N/A;   TONSILLECTOMY      Current Outpatient Medications:    albuterol (PROVENTIL HFA) 108 (90 Base) MCG/ACT inhaler, INHALE 1 PUFF INTO THE LUNGS EVERY 6 HOURS AS NEEDED FOR WHEEZING., Disp: 6.7 g, Rfl: 0   EPINEPHrine 0.3 mg/0.3 mL IJ SOAJ injection, Inject into the muscle., Disp: , Rfl:    Ferrous Sulfate (IRON) 90 (18 Fe) MG TABS, Take by mouth., Disp: , Rfl:    fluticasone-salmeterol (ADVAIR DISKUS) 250-50 MCG/ACT AEPB, Inhale 1 puff into the lungs  in the morning and at bedtime. Rinse mouth further refills PCP, Disp: 60 each, Rfl: 2   hydroxychloroquine (PLAQUENIL) 200 MG tablet, , Disp: , Rfl:    Multiple Vitamins-Minerals (MULTIVITAMIN WITH MINERALS) tablet, Take by mouth., Disp: , Rfl:    Omega-3 Fatty Acids (FISH OIL) 1000 MG CAPS, Take 1 capsule by mouth daily. , Disp: , Rfl:    valACYclovir (VALTREX) 1000 MG tablet, Take 1 tablet by mouth once daily for 5 days at first sign of outbreak, Disp: 40 tablet, Rfl: 0   doxycycline (VIBRA-TABS) 100 MG tablet, , Disp: , Rfl:    neomycin-polymyxin-hydrocortisone (CORTISPORIN) OTIC solution, Place 4 drops into both ears 3 (three) times daily. (Patient not taking: Reported on 06/25/2021), Disp: 10 mL, Rfl: 0    Family History  Problem Relation Age of Onset   Cancer Father        Skin   Hyperlipidemia Father    Hypertension Father        Possible   Diabetes Paternal Uncle      Social History   Tobacco Use   Smoking status: Former    Years: 1.00    Types: Cigarettes   Smokeless tobacco: Never   Tobacco comments:    "YEARS AGO"  Vaping Use   Vaping Use: Never used  Substance Use Topics   Alcohol use: No    Comment: Occasionally   Drug use: No    Allergies as of 06/25/2021 - Review Complete 06/25/2021  Allergen Reaction Noted   Cat hair extract Other (See Comments) 03/25/2021   Milk-related compounds  03/23/2012    Review of Systems:    All systems reviewed and negative except where noted in HPI.   Physical Exam:  BP 120/61 (BP Location: Right Arm, Patient Position: Sitting, Cuff Size: Normal)   Pulse 85   Temp 98.9 F (37.2 C) (Temporal)   Ht 6\' 1"  (1.854 m)   Wt 202 lb 9.6 oz (91.9 kg)   BMI 26.73 kg/m  No LMP for male patient.  General:   Alert,  Well-developed, well-nourished, pleasant and cooperative in NAD Head:  Normocephalic and atraumatic. Eyes:  Sclera clear, no icterus.   Conjunctiva pink. Ears:  Normal auditory acuity. Nose:  No deformity,  discharge, or lesions. Mouth:  No deformity or lesions,oropharynx pink & moist. Neck:  Supple; no masses or thyromegaly. Lungs:  Respirations even and unlabored.  Clear throughout to auscultation.   No wheezes, crackles, or rhonchi. No acute distress. Heart:  Regular rate and rhythm; no murmurs, clicks, rubs, or gallops. Abdomen:  Normal bowel sounds. Soft, non-tender and non-distended without masses, hepatosplenomegaly or hernias noted.  No guarding or rebound tenderness.   Rectal: Normal perianal skin, nontender digital rectal exam, anoscopy revealed large prolapsed internal and external hemorrhoids Msk:  Symmetrical without gross deformities. Good, equal movement & strength bilaterally. Pulses:  Normal pulses noted. Extremities:  No clubbing or edema.  No cyanosis. Neurologic:  Alert and oriented x3;  grossly normal neurologically. Skin:  Intact without significant lesions or rashes. No jaundice. Psych:  Alert and cooperative. Normal mood and affect.  Imaging Studies: No abdominal imaging  Assessment and Plan:   Philip Cordova is a 55 y.o. 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  Patient is willing to try hemorrhoid ligation first Discussed about risks and benefits of the procedure, consent obtained Proceed with hemorrhoid ligation today  Iron deficiency anemia secondary to chronic blood loss from bleeding internal hemorrhoids Continue oral iron Recheck CBC and iron panel in 3 months Check  vitamin B12 and folate during next visit   Follow up in 2 weeks   Cephas Darby, MD

## 2021-06-25 NOTE — Progress Notes (Signed)
PROCEDURE NOTE: The patient presents with symptomatic grade 3 hemorrhoids, unresponsive to maximal medical therapy, requesting rubber band ligation of his/her hemorrhoidal disease.  All risks, benefits and alternative forms of therapy were described and informed consent was obtained.  In the Left Lateral Decubitus position (if anoscopy is performed) anoscopic examination revealed grade 3 hemorrhoids in the all position(s).   The decision was made to band the RP internal hemorrhoid, and the [facility]Regan System was used to perform band ligation without complication.  Digital anorectal examination was then performed to assure proper positioning of the band, and to adjust the banded tissue as required.  The patient was discharged home without pain or other issues.  Dietary and behavioral recommendations were given and (if necessary - prescriptions were given), along with follow-up instructions.  The patient will return 2 weeks for follow-up and possible additional banding as required.  No complications were encountered and the patient tolerated the procedure well.   

## 2021-06-26 ENCOUNTER — Telehealth: Payer: Self-pay | Admitting: Family Medicine

## 2021-06-26 MED ORDER — EPINEPHRINE 0.3 MG/0.3ML IJ SOAJ: INTRAMUSCULAR | 0 refills | Status: DC

## 2021-06-26 NOTE — Telephone Encounter (Signed)
Medication has been refilled.

## 2021-06-26 NOTE — Telephone Encounter (Signed)
Refill on EPINEPHrine 0.3 mg/0.3 mL IJ SOAJ injection

## 2021-07-01 ENCOUNTER — Ambulatory Visit (INDEPENDENT_AMBULATORY_CARE_PROVIDER_SITE_OTHER): Payer: 59 | Admitting: Psychology

## 2021-07-01 DIAGNOSIS — F4322 Adjustment disorder with anxiety: Secondary | ICD-10-CM | POA: Diagnosis not present

## 2021-07-17 ENCOUNTER — Ambulatory Visit (INDEPENDENT_AMBULATORY_CARE_PROVIDER_SITE_OTHER): Payer: 59 | Admitting: Gastroenterology

## 2021-07-17 ENCOUNTER — Encounter: Payer: Self-pay | Admitting: Gastroenterology

## 2021-07-17 ENCOUNTER — Other Ambulatory Visit: Payer: Self-pay

## 2021-07-17 VITALS — BP 111/67 | HR 89 | Temp 98.1°F | Ht 73.0 in | Wt 205.8 lb

## 2021-07-17 DIAGNOSIS — K642 Third degree hemorrhoids: Secondary | ICD-10-CM

## 2021-07-17 NOTE — Progress Notes (Signed)
PROCEDURE NOTE: Patient noticed significant improvement in his hemorrhoidal symptoms, particularly the prolapse after first banding of the right posterior hemorrhoid.  He also reports that the bleeding has improved significantly  The patient presents with symptomatic grade 3 hemorrhoids, unresponsive to maximal medical therapy, requesting rubber band ligation of his hemorrhoidal disease.  All risks, benefits and alternative forms of therapy were described and informed consent was obtained.  The decision was made to band the LL internal hemorrhoid, and the [place] was used to perform band ligation without complication.  Digital anorectal examination was then performed to assure proper positioning of the band, and to adjust the banded tissue as required.  The patient was discharged home without pain or other issues.  Dietary and behavioral recommendations were given and (if necessary - prescriptions were given), along with follow-up instructions.  The patient will return 2 weeks for follow-up and possible additional banding as required.  No complications were encountered and the patient tolerated the procedure well.

## 2021-07-25 ENCOUNTER — Other Ambulatory Visit: Payer: Self-pay | Admitting: Family Medicine

## 2021-07-27 NOTE — Telephone Encounter (Signed)
Okay to fill last visit 04/2021 and the last fill was historical provider?

## 2021-07-28 NOTE — Telephone Encounter (Signed)
I believe this medication was prescribed by his rheumatologist. This medication should be refilled by them as this is not a medication that I manage.

## 2021-07-31 ENCOUNTER — Encounter: Payer: Self-pay | Admitting: Gastroenterology

## 2021-07-31 ENCOUNTER — Ambulatory Visit (INDEPENDENT_AMBULATORY_CARE_PROVIDER_SITE_OTHER): Payer: 59 | Admitting: Gastroenterology

## 2021-07-31 ENCOUNTER — Other Ambulatory Visit: Payer: Self-pay

## 2021-07-31 VITALS — BP 109/71 | HR 67 | Temp 98.7°F | Ht 73.0 in | Wt 200.4 lb

## 2021-07-31 DIAGNOSIS — K642 Third degree hemorrhoids: Secondary | ICD-10-CM

## 2021-07-31 DIAGNOSIS — D500 Iron deficiency anemia secondary to blood loss (chronic): Secondary | ICD-10-CM | POA: Diagnosis not present

## 2021-07-31 NOTE — Progress Notes (Signed)
PROCEDURE NOTE: Patient noticed significant improvement in his hemorrhoidal symptoms, particularly the prolapse after first banding of the right posterior hemorrhoid.  He also reports that the and rectal discomfort has improved significantly  Patient reports that the prolapse of the hemorrhoids, rectal bleeding and rectal discomfort  The patient presents with symptomatic grade 3 hemorrhoids, unresponsive to maximal medical therapy, requesting rubber band ligation of his hemorrhoidal disease.  All risks, benefits and alternative forms of therapy were described and informed consent was obtained.  The decision was made to band the LL internal hemorrhoid, and the [place] was used to perform band ligation without complication.  Digital anorectal examination was then performed to assure proper positioning of the band, and to adjust the banded tissue as required.  The patient was discharged home without pain or other issues.  Dietary and behavioral recommendations were given and (if necessary - prescriptions were given), along with follow-up instructions.  The patient will return as needed for follow-up and possible additional banding as required.  No complications were encountered and the patient tolerated the procedure well.  Check B12 and folate and given his history of iron deficiency anemia

## 2021-08-07 ENCOUNTER — Ambulatory Visit (INDEPENDENT_AMBULATORY_CARE_PROVIDER_SITE_OTHER): Payer: 59 | Admitting: Psychology

## 2021-08-07 DIAGNOSIS — F4322 Adjustment disorder with anxiety: Secondary | ICD-10-CM | POA: Diagnosis not present
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
## 2021-08-13 ENCOUNTER — Ambulatory Visit: Payer: 59 | Admitting: Dermatology

## 2021-08-13 ENCOUNTER — Other Ambulatory Visit: Payer: Self-pay

## 2021-08-13 DIAGNOSIS — L570 Actinic keratosis: Secondary | ICD-10-CM

## 2021-08-13 DIAGNOSIS — L820 Inflamed seborrheic keratosis: Secondary | ICD-10-CM | POA: Diagnosis not present

## 2021-08-13 DIAGNOSIS — B372 Candidiasis of skin and nail: Secondary | ICD-10-CM

## 2021-08-13 DIAGNOSIS — B079 Viral wart, unspecified: Secondary | ICD-10-CM

## 2021-08-13 DIAGNOSIS — M310 Hypersensitivity angiitis: Secondary | ICD-10-CM | POA: Diagnosis not present

## 2021-08-13 DIAGNOSIS — L814 Other melanin hyperpigmentation: Secondary | ICD-10-CM

## 2021-08-13 DIAGNOSIS — D1800 Hemangioma unspecified site: Secondary | ICD-10-CM

## 2021-08-13 DIAGNOSIS — Z1283 Encounter for screening for malignant neoplasm of skin: Secondary | ICD-10-CM | POA: Diagnosis not present

## 2021-08-13 DIAGNOSIS — L821 Other seborrheic keratosis: Secondary | ICD-10-CM

## 2021-08-13 DIAGNOSIS — L578 Other skin changes due to chronic exposure to nonionizing radiation: Secondary | ICD-10-CM

## 2021-08-13 DIAGNOSIS — D229 Melanocytic nevi, unspecified: Secondary | ICD-10-CM

## 2021-08-13 DIAGNOSIS — L304 Erythema intertrigo: Secondary | ICD-10-CM | POA: Diagnosis not present

## 2021-08-13 MED ORDER — KETOCONAZOLE 2 % EX CREA: 1.0000 "application " | TOPICAL_CREAM | Freq: Two times a day (BID) | CUTANEOUS | 1 refills | Status: AC

## 2021-08-13 NOTE — Progress Notes (Signed)
Follow-Up Visit   Subjective  Philip Cordova is a 55 y.o. male who presents for the following: Follow-up (Patient here today for 1 year tbse. Reports a spot at back of head he would like checked that he noticed a week ago. Patient reports a rash at groin. He also reports a spot at chest he would like checked. ). The patient presents for Total-Body Skin Exam (TBSE) for skin cancer screening and mole check.  The patient has spots, moles and lesions to be evaluated, some may be new or changing and the patient has concerns that these could be cancer.  The following portions of the chart were reviewed this encounter and updated as appropriate:  Tobacco   Allergies   Meds   Problems   Med Hx   Surg Hx   Fam Hx      Review of Systems: No other skin or systemic complaints except as noted in HPI or Assessment and Plan.  Objective  Well appearing patient in no apparent distress; mood and affect are within normal limits.  A full examination was performed including scalp, head, eyes, ears, nose, lips, neck, chest, axillae, abdomen, back, buttocks, bilateral upper extremities, bilateral lower extremities, hands, feet, fingers, toes, fingernails, and toenails. All findings within normal limits unless otherwise noted below.  chest Clear today   posterior scalp x 1 Erythematous stuck-on, waxy papule or plaque  thumb and middle finger x 2 (2) Verrucous papules -- Discussed viral etiology and contagion.   right temple x 3 (3) Erythematous thin papules/macules with gritty scale.    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    Questionable Lyme disease lab results.  Patient empirically treated with oral doxycycline 100 twice daily for a month due to these results and his clinical picture of annular plaques on the chest.   Rash clear today  Patient  has dropped hydroxychloroquine 200mg  to once daily as prescribed by rheumatologist. Patient following up with Dr. Posey Pronto -rheumatologist in 4 months.  Complicated condition, but seems to be doing better now.  Inflamed seborrheic keratosis posterior scalp x 1 Destruction of lesion - posterior scalp x 1 Complexity: simple   Destruction method: cryotherapy   Informed consent: discussed and consent obtained   Timeout:  patient name, date of birth, surgical site, and procedure verified Lesion destroyed using liquid nitrogen: Yes   Region frozen until ice ball extended beyond lesion: Yes   Outcome: patient tolerated procedure well with no complications   Post-procedure details: wound care instructions given   Additional details:  Prior to procedure, discussed risks of blister formation, small wound, skin dyspigmentation, or rare scar following cryotherapy. Recommend Vaseline ointment to treated areas while healing.  Viral warts, unspecified type (2) thumb and middle finger x 2 Discussed viral etiology and risk of spread.  Discussed multiple treatments may be required to clear warts.  Discussed possible post-treatment dyspigmentation and risk of recurrence.  Destruction of lesion - thumb and middle finger x 2 Complexity: simple   Destruction method: cryotherapy   Informed consent: discussed and consent obtained   Timeout:  patient name, date of birth, surgical site, and procedure verified Lesion destroyed using liquid nitrogen: Yes   Region frozen until ice ball extended beyond lesion: Yes   Outcome: patient tolerated procedure well with no complications   Post-procedure details: wound care instructions given   Additional details:  Prior to  procedure, discussed risks of blister formation, small wound, skin dyspigmentation, or rare scar following cryotherapy. Recommend Vaseline ointment to treated areas while healing.  Erythema intertrigo groin With candida   ketoconazole (NIZORAL) 2 %  cream - Pubic Apply 1 application topically in the morning and at bedtime. At groin for candida use for 4 weeks  Intertrigo is a chronic recurrent rash that occurs in skin fold areas that may be associated with friction; heat; moisture; yeast; fungus; and bacteria.  It is exacerbated by increased movement / activity; sweating; and higher atmospheric temperature.  Actinic keratosis (3) right temple x 3 Actinic keratoses are precancerous spots that appear secondary to cumulative UV radiation exposure/sun exposure over time. They are chronic with expected duration over 1 year. A portion of actinic keratoses will progress to squamous cell carcinoma of the skin. It is not possible to reliably predict which spots will progress to skin cancer and so treatment is recommended to prevent development of skin cancer.  Recommend daily broad spectrum sunscreen SPF 30+ to sun-exposed areas, reapply every 2 hours as needed.  Recommend staying in the shade or wearing long sleeves, sun glasses (UVA+UVB protection) and wide brim hats (4-inch brim around the entire circumference of the hat). Call for new or changing lesions.  Destruction of lesion - right temple x 3 Complexity: simple   Destruction method: cryotherapy   Informed consent: discussed and consent obtained   Timeout:  patient name, date of birth, surgical site, and procedure verified Lesion destroyed using liquid nitrogen: Yes   Region frozen until ice ball extended beyond lesion: Yes   Outcome: patient tolerated procedure well with no complications   Post-procedure details: wound care instructions given   Additional details:  Prior to procedure, discussed risks of blister formation, small wound, skin dyspigmentation, or rare scar following cryotherapy. Recommend Vaseline ointment to treated areas while healing.  Lentigines - Scattered tan macules - Due to sun exposure - Benign-appearing, observe - Recommend daily broad spectrum sunscreen SPF 30+  to sun-exposed areas, reapply every 2 hours as needed. - Call for any changes  Seborrheic Keratoses - Stuck-on, waxy, tan-brown papules and/or plaques  - Benign-appearing - Discussed benign etiology and prognosis. - Observe - Call for any changes  Melanocytic Nevi - Tan-brown and/or pink-flesh-colored symmetric macules and papules - Benign appearing on exam today - Observation - Call clinic for new or changing moles - Recommend daily use of broad spectrum spf 30+ sunscreen to sun-exposed areas.   Hemangiomas - Red papules - Discussed benign nature - Observe - Call for any changes  Actinic Damage - Chronic condition, secondary to cumulative UV/sun exposure - diffuse scaly erythematous macules with underlying dyspigmentation - Recommend daily broad spectrum sunscreen SPF 30+ to sun-exposed areas, reapply every 2 hours as needed.  - Staying in the shade or wearing long sleeves, sun glasses (UVA+UVB protection) and wide brim hats (4-inch brim around the entire circumference of the hat) are also recommended for sun protection.  - Call for new or changing lesions.  Skin cancer screening performed today.  Return for 1 year tbse . IRuthell Rummage, CMA, am acting as scribe for Sarina Ser, MD. Documentation: I have reviewed the above documentation for accuracy and completeness, and I agree with the above.  Sarina Ser, MD

## 2021-08-13 NOTE — Patient Instructions (Addendum)
Actinic keratoses are precancerous spots that appear secondary to cumulative UV radiation exposure/sun exposure over time. They are chronic with expected duration over 1 year. A portion of actinic keratoses will progress to squamous cell carcinoma of the skin. It is not possible to reliably predict which spots will progress to skin cancer and so treatment is recommended to prevent development of skin cancer.  Recommend daily broad spectrum sunscreen SPF 30+ to sun-exposed areas, reapply every 2 hours as needed.  Recommend staying in the shade or wearing long sleeves, sun glasses (UVA+UVB protection) and wide brim hats (4-inch brim around the entire circumference of the hat). Call for new or changing lesions.   Cryotherapy Aftercare  Wash gently with soap and water everyday.   Apply Vaseline and Band-Aid daily until healed.     Melanoma ABCDEs  Melanoma is the most dangerous type of skin cancer, and is the leading cause of death from skin disease.  You are more likely to develop melanoma if you: Have light-colored skin, light-colored eyes, or red or blond hair Spend a lot of time in the sun Tan regularly, either outdoors or in a tanning bed Have had blistering sunburns, especially during childhood Have a close family member who has had a melanoma Have atypical moles or large birthmarks  Early detection of melanoma is key since treatment is typically straightforward and cure rates are extremely high if we catch it early.   The first sign of melanoma is often a change in a mole or a new dark spot.  The ABCDE system is a way of remembering the signs of melanoma.  A for asymmetry:  The two halves do not match. B for border:  The edges of the growth are irregular. C for color:  A mixture of colors are present instead of an even brown color. D for diameter:  Melanomas are usually (but not always) greater than 52mm - the size of a pencil eraser. E for evolution:  The spot keeps changing in  size, shape, and color.  Please check your skin once per month between visits. You can use a small mirror in front and a large mirror behind you to keep an eye on the back side or your body.   If you see any new or changing lesions before your next follow-up, please call to schedule a visit.  Please continue daily skin protection including broad spectrum sunscreen SPF 30+ to sun-exposed areas, reapplying every 2 hours as needed when you're outdoors.   Staying in the shade or wearing long sleeves, sun glasses (UVA+UVB protection) and wide brim hats (4-inch brim around the entire circumference of the hat) are also recommended for sun protection.    If You Need Anything After Your Visit  If you have any questions or concerns for your doctor, please call our main line at [phone] and press option 4 to reach your doctor's medical assistant. If no one answers, please leave a voicemail as directed and we will return your call as soon as possible. Messages left after 4 pm will be answered the following business day.   You may also send [place] a message via [place]. We typically respond to MyChart messages within 1-2 business days.  For prescription refills, please ask your pharmacy to contact our office. Our fax number is [phone].  If you have an urgent issue when the clinic is closed that cannot wait until the next business day, you can page your doctor at the number below.  Please note that while we do our best to be available for urgent issues outside of office hours, we are not available 24/7.   If you have an urgent issue and are unable to reach [place], you may choose to seek medical care at your doctor's office, retail clinic, urgent care center, or emergency room.  If you have a medical emergency, please immediately call 911 or go to the emergency department.  Pager Numbers  - Dr. Nehemiah Massed: [phone]  - Dr. Laurence Ferrari: [phone]  - Dr. Nicole Kindred: [phone]  In the event of  inclement weather, please call our main line at [phone] for an update on the status of any delays or closures.  Dermatology Medication Tips: Please keep the boxes that topical medications come in in order to help keep track of the instructions about where and how to use these. Pharmacies typically print the medication instructions only on the boxes and not directly on the medication tubes.   If your medication is too expensive, please contact our office at [phone] option 4 or send [place] a message through [place].   We are unable to tell what your co-pay for medications will be in advance as this is different depending on your insurance coverage. However, we may be able to find a substitute medication at lower cost or fill out paperwork to get insurance to cover a needed medication.   If a prior authorization is required to get your medication covered by your insurance company, please allow [place] 1-2 business days to complete this process.  Drug prices often vary depending on where the prescription is filled and some pharmacies may offer cheaper prices.  The website www.goodrx.com contains coupons for medications through different pharmacies. The prices here do not account for what the cost may be with help from insurance (it may be cheaper with your insurance), but the website can give you the price if you did not use any insurance.  - You can print the associated coupon and take it with your prescription to the pharmacy.  - You may also stop by our office during regular business hours and pick up a GoodRx coupon card.  - If you need your prescription sent electronically to a different pharmacy, notify our office through [facility] or by phone at [phone] option 4.     Si Usted Necesita Algo Despus de Su Visita  Tambin puede enviarnos un mensaje a travs de Pharmacist, community. Por lo general respondemos a los mensajes de MyChart en el transcurso de 1 a 2 das hbiles.  Para renovar  recetas, por favor pida a su farmacia que se ponga en contacto con nuestra oficina. Harland Dingwall de fax es [place] [phone].  Si tiene un asunto urgente cuando la clnica est cerrada y que no puede esperar hasta el siguiente da hbil, puede llamar/localizar a su doctor(a) al nmero que aparece a continuacin.   Por favor, tenga en cuenta que aunque hacemos todo lo posible para estar disponibles para asuntos urgentes fuera del horario de [place], no estamos disponibles las 24 horas del da, los 7 das de la [place].   Si tiene un problema urgente y no puede comunicarse con nosotros, puede optar por buscar atencin mdica  en el consultorio de su doctor(a), en una clnica privada, en un centro de atencin urgente o en una sala de emergencias.  Si tiene Engineering geologist, por favor llame inmediatamente al 911 o vaya a la sala de emergencias.  Nmeros de bper  - Dr. Nehemiah Massed:  [phone]  - Dra. Moye: [phone]  - Dra. Nicole Kindred: [phone]  En caso de inclemencias del [place], por favor llame a Johnsie Kindred principal al [phone] para una actualizacin sobre el [place] de cualquier retraso o cierre.  Consejos para la medicacin en dermatologa: Por favor, guarde las cajas en las que vienen los medicamentos de uso tpico para ayudarle a seguir las instrucciones sobre dnde y cmo usarlos. Las farmacias generalmente imprimen las instrucciones del medicamento slo en las cajas y no directamente en los tubos del [place].   Si su medicamento es muy caro, por favor, pngase en contacto con Zigmund Daniel llamando al [phone] y presione la opcin 4 o envenos un mensaje a travs de Pharmacist, community.   No podemos decirle cul ser su copago por los medicamentos por adelantado ya que esto es diferente dependiendo de la cobertura de su seguro. Sin embargo, es posible que podamos encontrar un medicamento sustituto a Electrical engineer un formulario para que el seguro cubra el medicamento  que se considera necesario.   Si se requiere una autorizacin previa para que su compaa de seguros [place] su medicamento, por favor permtanos de 1 a 2 das hbiles para completar este proceso.  Los precios de los medicamentos varan con frecuencia dependiendo del Environmental consultant de dnde se surte la receta y alguna farmacias pueden ofrecer precios ms baratos.  El sitio web www.goodrx.com tiene cupones para medicamentos de Airline pilot. Los precios aqu no tienen en cuenta lo que podra costar con la ayuda del seguro (puede ser ms barato con su seguro), pero el sitio web puede darle el precio si no utiliz Research scientist (physical sciences).  - Puede imprimir el cupn correspondiente y llevarlo con su receta a la farmacia.  - Tambin puede pasar por nuestra oficina durante el horario de atencin regular y Charity fundraiser una tarjeta de cupones de GoodRx.  - Si necesita que su receta se enve electrnicamente a una farmacia diferente, informe a nuestra oficina a travs de MyChart de [place] o por telfono llamando al [phone] y presione la opcin 4.

## 2021-08-14 ENCOUNTER — Ambulatory Visit (INDEPENDENT_AMBULATORY_CARE_PROVIDER_SITE_OTHER): Payer: 59 | Admitting: Psychology

## 2021-08-14 DIAGNOSIS — F4322 Adjustment disorder with anxiety: Secondary | ICD-10-CM | POA: Diagnosis not present

## 2021-08-14 NOTE — Progress Notes (Signed)
[place] Counselor/Therapist Progress Note  Patient ID: Philip Cordova, MRN: [id number]    Date: 08/14/21  Time Spent: 10:07 am - 11:02 am : 55 Minutes  Treatment Type: Individual Therapy.  Reported Symptoms: Interpersonal stressors.   Mental Status Exam: Appearance:  Neat and Well Groomed     Behavior: Appropriate  Motor: Normal  Speech/Language:  Clear and Coherent and Normal Rate  Affect: Full Range  Mood: normal  Thought process: flight of ideas  Thought content:   WNL  Sensory/Perceptual disturbances:   WNL  Orientation: oriented to person, place, time/date, and situation  Attention: Fair  Concentration: Fair  Memory: [place] of knowledge:  Good  Insight:   Poor  Judgment:  Fair  Impulse Control: Fair   Risk Assessment: Danger to Self:  No Self-injurious Behavior: No Danger to Others: No Duty to Warn:no Physical Aggression / Violence:No  Access to Firearms a concern: No  Gang Involvement:No   Subjective:   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 Therapist participated from home office. We met online due to [place] pandemic. Cordaryl reviewed the events of the past week. Kais noted continued frustration with his brother's approach and behavior. We continued to process his relationship with his brother and highlighted a history of strained relationship from childhood. [place] communicated continued frustration with his family's in action in regards to the caring for his and their mother.  Therapist validated Williard's feelings and experience regarding this matter.  Additional transitions include his feelings of going to move out of state, which Elek considers loss, and discussed need to be more social. Continue to provide feedback and perspective regarding dating, the roles of mental movement, and numerous perspectives regarding how he views people.  Therapist challenged Skyeler's negative cognitions regarding dating and the roles  of men and women.  We will continue to process this going forward and explore these cognitions in their origin.  However, therapist identified cognitive dissidence regarding Pierre's interest in being social but his negative cognitions about people and socializing in general.Trayvion was engaged, motivated, and open to feedback.  He noted the need to become more social but discussed feeling ill prepared as a result of the status home.  Therapist scheduled Keevon for additional follow-up appointments to continue her work.  Galan would benefit from continued weekly sessions to address mood and functioning.  Additionally, Donnell appears to be a good candidate for psychiatric consult along with a corresponding treatment, if applicable.  Therapist provided supportive therapy regarding healthy cognitions.  Interventions: Interpersonal, CBT  Diagnosis: Adjustment disorder with anxiety  Treatment Plan:  Client Abilities/Strengths Harvin is intelligent and verbose.   Support System: Best friend  Client Treatment Preferences Outpatient Therapy  Client Statement of Needs Chrishun noted his goals for treatment including discontinue biting his finger nail biting, engaging in enjoyable activities, become more social, & managing mood, overall.   Treatment Level Weekly  Symptoms Anxiety: Biting fingernails, feeling restless, irritable, nervous, excessive worry, impending doom.   (Status: maintained) Depressive Symptoms:  Feeling bad about self  (Status: maintained)  Goals:   Usman experiences symptoms of anxiety with minimal depression.   Target Date: 2022-04-29 Frequency: Weekly  Progress: 0 Modality: individual    Therapist will provide referrals for additional resources as appropriate.  Therapist will provide psycho-education regarding Korion's diagnosis and corresponding treatment approaches and interventions. Licensed Clinical Social Worker, [place], LCSW will support the patient's ability to  achieve the goals identified. will employ CBT,  BA, Problem-solving, Solution Focused, Mindfulness,  coping skills, & other evidenced-based practices will be used to promote progress towards healthy functioning to help manage decrease symptoms associated with his diagnosis.   Reduce overall level, frequency, and intensity of the feelings of depression, anxiety and panic evidenced by decreased overall symptoms including decreased anxiety, irritability and negative cognitions from 6 to 7 days/week to 0 to 1 days/week per client report for at least 3 consecutive months. Verbally express understanding of the relationship between feelings of depression, anxiety and their impact on thinking patterns and behaviors. Verbalize an understanding of the role that distorted thinking plays in creating fears, excessive worry, and ruminations.  [name] participated in the creation of the treatment plan)   Buena Irish, LCSW

## 2021-08-18 ENCOUNTER — Encounter: Payer: Self-pay | Admitting: Dermatology

## 2021-08-21 ENCOUNTER — Other Ambulatory Visit: Payer: Self-pay | Admitting: Family Medicine

## 2021-08-21 DIAGNOSIS — B009 Herpesviral infection, unspecified: Secondary | ICD-10-CM

## 2021-08-25 NOTE — Telephone Encounter (Signed)
Pt called in requesting refill on medication (valACYclovir (VALTREX) 1000 MG tablet).  Pt called in requesting a 90 Day supply on medication. Pt stated that he just started back dating. Verify the direction with Pt on the medication. Pt understood the direction. Pt stated that he is taking medication 2 times a day. Pt requesting callback

## 2021-08-26 NOTE — Telephone Encounter (Signed)
Is he requesting to take this as a preventative medications on a daily basis? If so, the recommended dose is 1000 mg once daily. The twice a day dose is for treatment of an out break.

## 2021-09-02 DIAGNOSIS — J301 Allergic rhinitis due to pollen: Secondary | ICD-10-CM | POA: Diagnosis not present

## 2021-09-04 ENCOUNTER — Ambulatory Visit (INDEPENDENT_AMBULATORY_CARE_PROVIDER_SITE_OTHER): Payer: 59 | Admitting: Psychology

## 2021-09-04 DIAGNOSIS — R69 Illness, unspecified: Secondary | ICD-10-CM | POA: Diagnosis not present

## 2021-09-04 DIAGNOSIS — F4322 Adjustment disorder with anxiety: Secondary | ICD-10-CM

## 2021-09-04 NOTE — Progress Notes (Signed)
[place] Counselor/Therapist Progress Note  Patient ID: Tavious Griesinger, MRN: [id number]    Date: 09/04/21  Time Spent: 10:32 am - 11:30 am : 58 Minutes  Treatment Type: Individual Therapy.  Reported Symptoms: Sadness.   Mental Status Exam: Appearance:  Neat and Well Groomed     Behavior: Appropriate  Motor: Normal  Speech/Language:  Clear and Coherent and Normal Rate  Affect: Depressed  Mood: depressed  Thought process: flight of ideas  Thought content:   WNL  Sensory/Perceptual disturbances:   WNL  Orientation: oriented to person, place, time/date, and situation  Attention: Fair  Concentration: Fair  Memory: [place] of knowledge:  Good  Insight:   Poor  Judgment:  Fair  Impulse Control: Fair   Risk Assessment: Danger to Self:  No Self-injurious Behavior: No Danger to Others: No Duty to Warn:no Physical Aggression / Violence:No  Access to Firearms a concern: No  Gang Involvement:No   Subjective:   Sales promotion account executive participated from car, via video, and consented to treatment. Therapist participated from home office. We met online due to [place] pandemic. Jomari reviewed the events of the past week. Khristopher endorsed a wave of sadness and noted feeling lonely. He denied any SI. Akili noted interpersonal stressors  and difficulty in day-to-day social situations. Jamahl noted withdrawing socially as a result of these stressors. We explored this, during the session, and his proposed approach to addressing his social stressors. We worked on defining his feelings of loneliness.  Therapist encouraged to call to engage in introspection and identified possible reasons for his approach.  Therapist encouraged Marcelis to engage in decision making based off of positive and healthy emotions.  We worked on identifying short-term and long-term benefits of his proposed approach of being more disconnected and neutral during social outings and situations.  Therapist employed CBT framework to  identify the thoughts feelings and corresponding behaviors regarding this approach and the possible long-term effects on Atticus's overall mood and functioning..  Therapist worked on drawing parallels between how people treat him and how he feels in relation to others.  Nicholas was engaged during the session and committed to continuing treatment.  Follow-up was previously scheduled. Therapist provided supportive therapy regarding healthy cognitions and encouraged continued treatment.  Interventions: Interpersonal, CBT  Diagnosis: Adjustment disorder with anxiety  Treatment Plan:  Client Abilities/Strengths Josue is intelligent and verbose.   Support System: Best friend  Client Treatment Preferences Outpatient Therapy  Client Statement of Needs Rumeal noted his goals for treatment including discontinue biting his finger nail biting, engaging in enjoyable activities, become more social, & managing mood, overall.   Treatment Level Weekly  Symptoms Anxiety: Biting fingernails, feeling restless, irritable, nervous, excessive worry, impending doom.   (Status: maintained) Depressive Symptoms:  Feeling bad about self  (Status: maintained)  Goals:   Darin experiences symptoms of anxiety with minimal depression.   Target Date: 2022-04-29 Frequency: Weekly  Progress: 0 Modality: individual    Therapist will provide referrals for additional resources as appropriate.  Therapist will provide psycho-education regarding Kenzel's diagnosis and corresponding treatment approaches and interventions. Licensed Clinical Social Worker, [place], LCSW will support the patient's ability to achieve the goals identified. will employ CBT, BA, Problem-solving, Solution Focused, Mindfulness,  coping skills, & other evidenced-based practices will be used to promote progress towards healthy functioning to help manage decrease symptoms associated with his diagnosis.   Reduce overall level, frequency, and  intensity of the feelings of depression, anxiety and panic evidenced by decreased  overall symptoms including decreased anxiety, irritability and negative cognitions from 6 to 7 days/week to 0 to 1 days/week per client report for at least 3 consecutive months. Verbally express understanding of the relationship between feelings of depression, anxiety and their impact on thinking patterns and behaviors. Verbalize an understanding of the role that distorted thinking plays in creating fears, excessive worry, and ruminations.  [name] participated in the creation of the treatment plan)   Buena Irish, LCSW

## 2021-09-10 ENCOUNTER — Ambulatory Visit (INDEPENDENT_AMBULATORY_CARE_PROVIDER_SITE_OTHER): Payer: 59 | Admitting: Psychology

## 2021-09-10 DIAGNOSIS — F4322 Adjustment disorder with anxiety: Secondary | ICD-10-CM

## 2021-09-10 DIAGNOSIS — J301 Allergic rhinitis due to pollen: Secondary | ICD-10-CM | POA: Diagnosis not present

## 2021-09-10 DIAGNOSIS — R69 Illness, unspecified: Secondary | ICD-10-CM | POA: Diagnosis not present

## 2021-09-10 NOTE — Progress Notes (Signed)
[facility] Counselor/Therapist Progress Note  Patient ID: Philip Cordova, MRN: [id number]    Date: 09/10/21  Time Spent: 3:35 pm - 4:31 pm : 56 Minutes  Treatment Type: Individual Therapy.  Reported Symptoms: Sadness.   Mental Status Exam: Appearance:  Neat and Well Groomed     Behavior: Appropriate  Motor: Normal  Speech/Language:  Clear and Coherent and Normal Rate  Affect: Depressed  Mood: depressed  Thought process: flight of ideas  Thought content:   WNL  Sensory/Perceptual disturbances:   WNL  Orientation: oriented to person, place, time/date, and situation  Attention: Fair  Concentration: Fair  Memory: [place] of knowledge:  Good  Insight:   Poor  Judgment:  Fair  Impulse Control: Fair   Risk Assessment: Danger to Self:  No Self-injurious Behavior: No Danger to Others: No Duty to Warn:no Physical Aggression / Violence:No  Access to Firearms a concern: No  Gang Involvement:No   Subjective:   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 Therapist participated from home office. We met online due to [name] pandemic. [name] reviewed the events of the past week. [name] endorsed nailbiting and noted his attempt to manage this via "bitter nails", which he noted was unsuccessful due to a reminder of his cancer treatment. He noted this being a long standing issue, nail biting, since childhood. He attempted numerous interventions to address with little success.  However, he remained hopeful regarding addressing this habit.  [name] noted his anxiety regarding his health, as it oral cancer survivor, and his efforts to maintain his health previously and currently.  He noted "doing the right things" prior to his cancer diagnosis and still receiving cancer diagnosis.  [name] provided additional history and background regarding his dating life and relationship life.  He noted current feelings of loneliness and difficulty connecting with others.   He discussed his anxiety and worry that others would identify that he is wealthy and that this might shift dynamics.  He discusses worry about being used or taken advantage of as a result of this.  We worked on identifying his framework for relationships and dating.  He noted history of lack of trust in others, lack of disclosure due to the aforementioned concerns.  He discussed interest and cleaning and organizing his home that it might become more hospitable but noted to tension related to having people in his home.  Therapist challenged [name]'s negative thoughts and assumptions regarding dating relationships, which was tolerated well, during the session.  [name] was engaged and motivated during the session and expressed commitment towards her goals.  [name] would benefit from consistent weekly counseling to address mood and functioning.  He continues to participate regularly and noted his interest in continuing treatment.  Therapist validated and normalized [name] positive feelings, challenged negative self talk and assumptions, and provided supportive therapy.  Interventions: Interpersonal, CBT  Diagnosis: Adjustment disorder with anxiety  Treatment Plan:  Client Abilities/Strengths [name] is intelligent and verbose.   Support System: Best friend  Client Treatment Preferences Outpatient Therapy  Client Statement of Needs [name] noted his goals for treatment including discontinue biting his finger nail biting, engaging in enjoyable activities, become more social, & managing mood, overall.   Treatment Level Weekly  Symptoms Anxiety: Biting fingernails, feeling restless, irritable, nervous, excessive worry, impending doom.   (Status: maintained) Depressive Symptoms:  Feeling bad about self  (Status: maintained)  Goals:   [name] experiences symptoms of anxiety with minimal depression.   Target Date:  2022-04-29 Frequency: Weekly  Progress: 0 Modality: individual    Therapist will  provide referrals for additional resources as appropriate.  Therapist will provide psycho-education regarding [name]'s diagnosis and corresponding treatment approaches and interventions. Licensed Clinical Social Worker, [place], LCSW will support the patient's ability to achieve the goals identified. will employ CBT, BA, Problem-solving, Solution Focused, Mindfulness,  coping skills, & other evidenced-based practices will be used to promote progress towards healthy functioning to help manage decrease symptoms associated with his diagnosis.   Reduce overall level, frequency, and intensity of the feelings of depression, anxiety and panic evidenced by decreased overall symptoms including decreased anxiety, irritability and negative cognitions from 6 to 7 days/week to 0 to 1 days/week per client report for at least 3 consecutive months. Verbally express understanding of the relationship between feelings of depression, anxiety and their impact on thinking patterns and behaviors. Verbalize an understanding of the role that distorted thinking plays in creating fears, excessive worry, and ruminations.  [name] participated in the creation of the treatment plan)   Buena Irish, LCSW                   Buena Irish, LCSW

## 2021-09-15 ENCOUNTER — Other Ambulatory Visit: Payer: Self-pay

## 2021-09-15 ENCOUNTER — Encounter: Payer: Self-pay | Admitting: Internal Medicine

## 2021-09-15 ENCOUNTER — Ambulatory Visit: Payer: Self-pay | Admitting: Internal Medicine

## 2021-09-15 VITALS — BP 116/78 | HR 73 | Temp 97.4°F | Ht 73.0 in | Wt 201.0 lb

## 2021-09-15 DIAGNOSIS — A630 Anogenital (venereal) warts: Secondary | ICD-10-CM

## 2021-09-15 NOTE — Patient Instructions (Addendum)
Aldara or cryo freezing   Genital Warts Genital warts are a common STI (sexually transmitted infection). They may appear as small bumps on the skin of the genital and anal areas. They sometimes become irritated and cause pain. Genital warts are easily passed to other people through sexual contact. Many people do not know that they are infected, and they may be infected for years without symptoms. Even without symptoms, they can pass the infection to their sexual partners. What are the causes? This condition is caused by a virus that is called human papillomavirus (HPV). HPV is spread by having unprotected sex with an infected person. It can be spread through vaginal, anal, and oral sex. What increases the risk? You are more likely to develop this condition if: You have unprotected sex. You have multiple sexual partners. You are sexually active before age 39. You are a man who is not circumcised. You have a male sexual partner who is not circumcised. You have a weakened body defense system (immune system) due to disease or medicine. What are the signs or symptoms? Symptoms of this condition include: Small growths in the genital area or anal area. These warts often grow in clusters. Itching and irritation in the genital area or anal area. Bleeding from the warts. Pain during sex. How is this diagnosed? This condition is diagnosed based on your symptoms and a physical exam. You may also have other tests, including: Biopsy. A tissue sample is removed so it can be checked under a microscope. Colposcopy. In females, a magnifying tool is used to examine the vagina and cervix. Certain solutions may be used to make the HPV cells change color so they can be seen more easily. A Pap test in females. Tests for other STIs. How is this treated? This condition may be treated with: Medicines, such as solutions or creams that are applied to your skin (topical). Procedures, such as: Freezing the warts  with liquid nitrogen (cryotherapy). Burning the warts with a laser or electric probe (electrocautery). Surgery to remove the warts. Getting treatment is important because genital warts can lead to other problems. In females, the virus that causes genital warts may increase the risk for cervical cancer. Follow these instructions at home: Medicines  Apply over-the-counter and prescription medicines only as told by your health care provider. Do not treat genital warts with medicines that are used for treating hand warts. Talk with your health care provider about using over-the-counter anti-itch creams. Instructions for women Get screened regularly for cervical cancer. This type of cancer is slow growing and can almost always be cured if it is found early. If you become pregnant, tell your health care provider that you have had an HPV infection. Your health care provider will monitor you closely during pregnancy. General instructions Do not touch or scratch the warts. Do not have sex until your treatment has been completed. Tell your current and past sexual partners about your condition because they may also need treatment. After treatment, use condoms during sex to prevent future infections. Keep all follow-up visits as told by your health care provider. This is important. How is this prevented? Talk with your health care provider about getting the HPV vaccine. The vaccine: Can prevent some HPV infections and cancers. Is recommended for males and females who are 48-34 years old. Is not recommended for pregnant women. Will not work if you already have HPV. Contact a health care provider if you: Have redness, swelling, or pain in the area of the  treated skin. Have a fever. Feel generally ill. Feel lumps in and around your genital or anal area. Have bleeding in your genital or anal area. Have pain during sex or bleeding after sex. Summary Genital warts are a common STI (sexually  transmitted infection). It may appear as small bumps on the genital and anal areas. This condition is caused by a virus that is called human papillomavirus (HPV). HPV is spread by having unprotected sex with an infected person. It can be spread through vaginal, anal, and oral sex. Treatment is important because genital warts can lead to other problems. In females, the virus that causes genital warts may increase the risk for cervical cancer. This condition may be treated with medicine that is applied to the skin or procedures to remove the warts. The HPV vaccine can prevent some HPV infections and cancers. It is recommended that the vaccine be given to males and females who are 47-89 years old. This information is not intended to replace advice given to you by your health care provider. Make sure you discuss any questions you have with your health care provider. Document Revised: 06/28/2019 Document Reviewed: 06/28/2019 Elsevier Patient Education  [place].

## 2021-09-15 NOTE — Progress Notes (Signed)
Chief Complaint  Patient presents with   Genital Warts   F/u  Penile lesions h/o hpv and hsv seeing Dr.K tomorrow rec aldara vs ln2 h/o neck cancer in 2017 est Dr. Pryor Ochoa ENT Noticed Friday and has a date with a woman and wants this tx'ed    Review of Systems  Constitutional:  Negative for weight loss.  HENT:  Negative for hearing loss.   Eyes:  Negative for blurred vision.  Respiratory:  Negative for shortness of breath.   Cardiovascular:  Negative for chest pain.  Gastrointestinal:  Negative for abdominal pain and blood in stool.  Musculoskeletal:  Negative for back pain.  Skin:  Negative for rash.  Neurological:  Negative for headaches.  Psychiatric/Behavioral:  Negative for depression.   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    Past Surgical History:  Procedure Laterality Date   APPENDECTOMY  2008   COLONOSCOPY WITH PROPOFOL N/A 08/14/2020   Procedure: COLONOSCOPY WITH PROPOFOL;  Surgeon: Virgel Manifold, MD;  Location: [facility] END[facility]COPY;  Service: Endoscopy;  Laterality: N/A;   DIAGNOSTIC LARYNGOSCOPY  06/17/2016   Procedure: DIAGNOSTIC LARYNGOSCOPY WITH BIOPSIES;  Surgeon: Carloyn Manner, MD;  Location: [facility] ORS;  Service: ENT;;   Moles removed  2005   [place] N/A 06/17/2016   Procedure: TONSILLECTOMY;  Surgeon: Carloyn Manner, MD;  Location: [facility] ORS;  Service: ENT;  Laterality: N/A;   TONSILLECTOMY     Family History  Problem Relation Age of Onset   Cancer Father        Skin   Hyperlipidemia Father    Hypertension Father        Possible   Diabetes Paternal Uncle    Social History   Socioeconomic History   Marital status: Single    Spouse name: Not on file   Number of children: Not on file   Years of education: Not on file   Highest education level: Not  on file  Occupational History   Not on file  Tobacco Use   Smoking status: Former    Years: 1.00    Types: Cigarettes   Smokeless tobacco: Never   Tobacco comments:    "YEARS AGO"  Vaping Use   Vaping Use: Never used  Substance and Sexual Activity   Alcohol use: No    Comment: Occasionally   Drug use: No   Sexual activity: Not on file  Other Topics Concern   Not on file  Social History Narrative   1 dog    No kids   Single    Social Determinants of Health   Financial Resource Strain: Not on file  Food Insecurity: Not on file  Transportation Needs: Not on file  Physical Activity: Not on file  Stress: Not on file  Social Connections: Not on file  Intimate Partner Violence: Not on file   Current Meds  Medication Sig   albuterol (PROVENTIL HFA) 108 (90 Base) MCG/ACT inhaler INHALE 1 PUFF INTO THE LUNGS EVERY 6 HOURS AS NEEDED FOR WHEEZING.   EPINEPHrine 0.3 mg/0.3 mL IJ SOAJ injection Inject into the muscle.   Ferrous Sulfate (IRON) 90 (18 Fe) MG TABS Take by mouth.   fluticasone-salmeterol (ADVAIR DISKUS) 250-50 MCG/ACT AEPB Inhale 1 puff into the lungs in the  morning and at bedtime. Rinse mouth further refills PCP   hydroxychloroquine (PLAQUENIL) 200 MG tablet    ketoconazole (NIZORAL) 2 % cream Apply 1 application topically in the morning and at bedtime. At groin for candida use for 4 weeks   Multiple Vitamins-Minerals (MULTIVITAMIN WITH MINERALS) tablet Take by mouth.   neomycin-polymyxin-hydrocortisone (CORTISPORIN) OTIC solution Place 4 drops into both ears 3 (three) times daily.   Omega-3 Fatty Acids (FISH OIL) 1000 MG CAPS Take 1 capsule by mouth daily.    testosterone cypionate (DEPOTESTOTERONE CYPIONATE) 100 MG/ML injection Inject into the muscle every 14 (fourteen) days. For IM use only   valACYclovir (VALTREX) 1000 MG tablet Take 1 tablet by mouth once daily   Allergies  Allergen Reactions   Cat Hair Extract Other (See Comments)   Milk-Related Compounds     No results found for this or any previous visit (from the past 2160 hour(s)). Objective  Body mass index is 26.52 kg/m. Wt Readings from Last 3 Encounters:  09/15/21 201 lb (91.2 kg)  07/31/21 200 lb 6.4 oz (90.9 kg)  07/17/21 205 lb 12.8 oz (93.4 kg)   Temp Readings from Last 3 Encounters:  09/15/21 (!) 97.4 F (36.3 C) (Temporal)  07/31/21 98.7 F (37.1 C) (Temporal)  07/17/21 98.1 F (36.7 C) (Temporal)   BP Readings from Last 3 Encounters:  09/15/21 116/78  07/31/21 109/71  07/17/21 111/67   Pulse Readings from Last 3 Encounters:  09/15/21 73  07/31/21 67  07/17/21 89    Physical Exam Vitals and nursing note reviewed.  Constitutional:      Appearance: Normal appearance. He is well-developed and well-groomed. He is obese.  HENT:     Head: Normocephalic and atraumatic.  Eyes:     Conjunctiva/sclera: Conjunctivae normal.     Pupils: Pupils are equal, round, and reactive to light.  Cardiovascular:     Rate and Rhythm: Normal rate.  Pulmonary:     Effort: Pulmonary effort is normal. No respiratory distress.  Abdominal:     Tenderness: There is no abdominal tenderness.  Musculoskeletal:     Lumbar back: Tenderness present. Negative right straight leg raise test and negative left straight leg raise test.  Skin:    General: Skin is warm and moist.  Neurological:     General: No focal deficit present.     Mental Status: He is alert and oriented to person, place, and time. Mental status is at baseline.     Sensory: Sensation is intact.     Motor: Motor function is intact.     Coordination: Coordination is intact.     Gait: Gait is intact. Gait normal.  Psychiatric:        Attention and Perception: Attention and perception normal.        Mood and Affect: Mood and affect normal.        Speech: Speech normal.        Behavior: Behavior normal. Behavior is cooperative.        Thought Content: Thought content normal.        Cognition and Memory: Cognition and  memory normal.        Judgment: Judgment normal.    Assessment  Plan  Genital warts  F/u dermatology Dr. Raliegh Ip 09/16/21  Aldara vs ln2  F/u PCP as sch will need TSH checked again   Provider: Dr. Olivia Mackie [facility]

## 2021-09-16 ENCOUNTER — Telehealth: Payer: Self-pay | Admitting: Family Medicine

## 2021-09-16 ENCOUNTER — Ambulatory Visit (INDEPENDENT_AMBULATORY_CARE_PROVIDER_SITE_OTHER): Payer: Self-pay | Admitting: Dermatology

## 2021-09-16 DIAGNOSIS — A630 Anogenital (venereal) warts: Secondary | ICD-10-CM

## 2021-09-16 MED ORDER — CONDYLOX 0.5 % EX GEL: CUTANEOUS | 2 refills | Status: AC

## 2021-09-16 NOTE — Patient Instructions (Signed)

## 2021-09-16 NOTE — Telephone Encounter (Signed)
Pt is requesting a hpv vaccine. Pt was advised PCP is out of office and will be notified when he can come in to get this done. Pt gave verbal understanding.

## 2021-09-16 NOTE — Progress Notes (Signed)
° °  Follow-Up Visit   Subjective  Philip Cordova is a 56 y.o. male who presents for the following: Warts (Pt c/o of warts on his foreskin. Hx of HPV. Pt states that he has had to have them frozen off in the past. ).  The following portions of the chart were reviewed this encounter and updated as appropriate:  Tobacco   Allergies   Meds   Problems   Med Hx   Surg Hx   Fam Hx      Review of Systems: No other skin or systemic complaints except as noted in HPI or Assessment and Plan.  Objective  Well appearing patient in no apparent distress; mood and affect are within normal limits.  A focused examination was performed including penis. Relevant physical exam findings are noted in the Assessment and Plan.  penis x 4 (4) Verrucous papules -- Discussed viral etiology and contagion.    Assessment & Plan  Genital warts (4) penis x 4 Counseling: Discussed viral etiology and risk of spread.  Discussed multiple treatments may be required to clear warts.  Discussed possible post-treatment dyspigmentation and risk of recurrence.  Recommend speaking with PCP about getting the HPV vaccine. Advised the HPV Vaccine is not a cure and is designed as a preventive measure, but there is anectdotal evidence it may help with active infections.  Will send Condylox gel to spot treat as needed.   Destruction of lesion - penis x 4 Complexity: simple   Destruction method: cryotherapy   Informed consent: discussed and consent obtained   Timeout:  patient name, date of birth, surgical site, and procedure verified Lesion destroyed using liquid nitrogen: Yes   Region frozen until ice ball extended beyond lesion: Yes   Outcome: patient tolerated procedure well with no complications   Post-procedure details: wound care instructions given    podofilox (CONDYLOX) 0.5 % gel - penis x 4 Spot treat affected as needed.  Return if symptoms worsen or fail to improve.  IHarriett Sine, CMA, am acting as scribe for  Sarina Ser, MD. Documentation: I have reviewed the above documentation for accuracy and completeness, and I agree with the above.  Sarina Ser, MD

## 2021-09-21 ENCOUNTER — Encounter: Payer: Self-pay | Admitting: Dermatology

## 2021-09-29 DIAGNOSIS — D500 Iron deficiency anemia secondary to blood loss (chronic): Secondary | ICD-10-CM | POA: Diagnosis not present

## 2021-09-29 LAB — BASIC METABOLIC PANEL
BUN: 21 (ref 4–21)
CO2: 24 — AB (ref 13–22)
Chloride: 101 (ref 99–108)
Creatinine: 1.2 (ref <=1.3)
Glucose: 106
Potassium: 4.4 mEq/L (ref 3.5–5.1)
Sodium: 139 (ref 137–147)

## 2021-09-29 LAB — LIPID PANEL
Cholesterol: 162 (ref 0–200)
HDL: 44 (ref 35–70)
LDL Cholesterol: 98
Triglycerides: 107 (ref 40–160)

## 2021-09-29 LAB — HEPATIC FUNCTION PANEL
ALT: 26 U/L (ref 10–40)
AST: 30 (ref 14–40)
Alkaline Phosphatase: 69 (ref 25–125)
Bilirubin, Total: 0.4

## 2021-09-29 LAB — CBC AND DIFFERENTIAL
HCT: 50 (ref 41–53)
Hemoglobin: 16.7 (ref 13.5–17.5)
Neutrophils Absolute: 5.4
Platelets: 287 10*3/uL (ref 150–400)
WBC: 8.5

## 2021-09-29 LAB — TSH: TSH: 11.8 — AB (ref <=5.90)

## 2021-09-29 LAB — COMPREHENSIVE METABOLIC PANEL
Albumin: 4.4 (ref 3.5–5.0)
Calcium: 9.4 (ref 8.7–10.7)
eGFR: 73

## 2021-09-29 LAB — CBC: RBC: 5.37 — AB (ref 3.87–5.11)

## 2021-09-29 LAB — PSA: PSA: 0.8

## 2021-09-30 ENCOUNTER — Encounter: Payer: 59 | Admitting: Psychology

## 2021-09-30 LAB — B12 AND FOLATE PANEL
Folate: 19.4 ng/mL (ref >3.0)
Vitamin B-12: 1104 pg/mL (ref 232–1245)

## 2021-09-30 NOTE — Progress Notes (Signed)
This encounter was created in error - please disregard.

## 2021-10-03 ENCOUNTER — Other Ambulatory Visit: Payer: Self-pay | Admitting: Family Medicine

## 2021-10-06 ENCOUNTER — Ambulatory Visit (INDEPENDENT_AMBULATORY_CARE_PROVIDER_SITE_OTHER): Payer: 59 | Admitting: Psychology

## 2021-10-06 DIAGNOSIS — R69 Illness, unspecified: Secondary | ICD-10-CM | POA: Diagnosis not present

## 2021-10-06 DIAGNOSIS — F4322 Adjustment disorder with anxiety: Secondary | ICD-10-CM

## 2021-10-06 NOTE — Progress Notes (Signed)
[facility] Counselor/Therapist Progress Note  Patient ID: Philip Cordova, MRN: [id number]    Date: 10/06/21  Time Spent: 3:05 pm - 4:05 pm : 60 Minutes  Treatment Type: Individual Therapy.  Reported Symptoms: Interpersonal Stressors  Mental Status Exam: Appearance:  Neat and Well Groomed     Behavior: Appropriate  Motor: Normal  Speech/Language:  Clear and Coherent  Affect: Appropriate  Mood: anxious  Thought process: flight of ideas  Thought content:   WNL  Sensory/Perceptual disturbances:   WNL  Orientation: oriented to person, place, time/date, and situation  Attention: Fair  Concentration: Fair  Memory: [place] of knowledge:  Good  Insight:   Poor  Judgment:  Fair  Impulse Control: Fair   Risk Assessment: Danger to Self:  No Self-injurious Behavior: No Danger to Others: No Duty to Warn:no Physical Aggression / Violence:No  Access to Firearms a concern: No  Gang Involvement:No   Subjective:   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 Therapist participated from office. We met online due to [place] pandemic. Aleksey reviewed the events of the past week. He discussed his missed appointment, which was due to sleep. Serapio highlighted some improvement in his overall  engagement in task completion and noted improvement, to some degree, in his mood and interest in socialization. We worked on processing and challenging preconceived notions and distortions regarding gender & gender roles. Teri would benefit from consistent weekly counseling to address mood and functioning.  He continues to participate regularly and noted his interest in continuing treatment.  Therapist validated and normalized Brevyn's positive and adaptive feelings, challenged negative self talk and assumptions, and provided supportive therapy.  Interventions: Interpersonal, CBT  Diagnosis: Adjustment disorder with anxiety  Treatment Plan:  Client  Abilities/Strengths Karriem is intelligent and verbose.   Support System: Best friend  Client Treatment Preferences Outpatient Therapy  Client Statement of Needs Bekim noted his goals for treatment including discontinue biting his finger nail biting, engaging in enjoyable activities, become more social, & managing mood, overall.   Treatment Level Weekly  Symptoms Anxiety: Biting fingernails, feeling restless, irritable, nervous, excessive worry, impending doom.   (Status: maintained) Depressive Symptoms:  Feeling bad about self  (Status: maintained)  Goals:   Demitrus experiences symptoms of anxiety with minimal depression.   Target Date: 2022-04-29 Frequency: Weekly  Progress: 0 Modality: individual    Therapist will provide referrals for additional resources as appropriate.  Therapist will provide psycho-education regarding Timohty's diagnosis and corresponding treatment approaches and interventions. Licensed Clinical Social Worker, [place], LCSW will support the patient's ability to achieve the goals identified. will employ CBT, BA, Problem-solving, Solution Focused, Mindfulness,  coping skills, & other evidenced-based practices will be used to promote progress towards healthy functioning to help manage decrease symptoms associated with his diagnosis.   Reduce overall level, frequency, and intensity of the feelings of depression, anxiety and panic evidenced by decreased overall symptoms including decreased anxiety, irritability and negative cognitions from 6 to 7 days/week to 0 to 1 days/week per client report for at least 3 consecutive months. Verbally express understanding of the relationship between feelings of depression, anxiety and their impact on thinking patterns and behaviors. Verbalize an understanding of the role that distorted thinking plays in creating fears, excessive worry, and ruminations.  [name] participated in the creation of the treatment plan)  Buena Irish, LCSW

## 2021-10-07 NOTE — Telephone Encounter (Signed)
This needs to come from his dermatologist who initially prescribed it. I do not manage this type of medication.

## 2021-10-12 ENCOUNTER — Other Ambulatory Visit: Payer: Self-pay | Admitting: Family Medicine

## 2021-10-13 ENCOUNTER — Ambulatory Visit (INDEPENDENT_AMBULATORY_CARE_PROVIDER_SITE_OTHER): Payer: 59 | Admitting: Psychology

## 2021-10-13 DIAGNOSIS — R69 Illness, unspecified: Secondary | ICD-10-CM | POA: Diagnosis not present

## 2021-10-13 DIAGNOSIS — F4322 Adjustment disorder with anxiety: Secondary | ICD-10-CM

## 2021-10-13 NOTE — Progress Notes (Signed)
[facility] Counselor/Therapist Progress Note  Patient ID: Laiden Milles, MRN: [id number]    Date: 10/13/21  Time Spent: 4:04 pm - 5:03 pm : 59 Minutes  Treatment Type: Individual Therapy.  Reported Symptoms: Interpersonal Stressors, anxiety  Mental Status Exam: Appearance:  Neat and Well Groomed     Behavior: Agitated  Motor: Normal  Speech/Language:  Clear and Coherent  Affect: Appropriate  Mood: anxious  Thought process: flight of ideas  Thought content:   WNL  Sensory/Perceptual disturbances:   WNL  Orientation: oriented to person, place, time/date, and situation  Attention: Fair  Concentration: Fair  Memory: [place] of knowledge:  Good  Insight:   Poor  Judgment:  Fair  Impulse Control: Fair   Risk Assessment: Danger to Self:  No Self-injurious Behavior: No Danger to Others: No Duty to Warn:no Physical Aggression / Violence:No  Access to Firearms a concern: No  Gang Involvement:No   Subjective:   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 Therapist participated from office. We met online due to [place] pandemic. Ola reviewed the events of the past week. We worked on processing his schema's regarding gender and relationships, during the session. He noted not "caring" about other people's feelings or experiences and noted not receiving this type of experience as a child or adult. He described his mother as high needs and highly inconstant throughout his life. Render noted being bitter and resentful of his mother's behavior and his childhood, as a whole. Kelvin noted significant Incongruence in parenting including mixed messages. Jamorris noted "My mom was a horrible example of why I wouldn't commit to a woman". We worked on exploring this in relation to his schema. He discussed a need for financial security in relationships. We will work on defining this going forward. Therapist validated and normalized Kahari's positive and adaptive  feelings, challenged negative self talk and assumptions, and provided supportive therapy.   Interventions: Interpersonal, CBT  Diagnosis: Adjustment disorder with anxiety  Treatment Plan:  Client Abilities/Strengths Kemoni is intelligent and verbose.   Support System: Best friend  Client Treatment Preferences Outpatient Therapy  Client Statement of Needs Dragon noted his goals for treatment including discontinue biting his finger nail biting, engaging in enjoyable activities, become more social, & managing mood, overall.   Treatment Level Weekly  Symptoms Anxiety: Biting fingernails, feeling restless, irritable, nervous, excessive worry, impending doom.   (Status: maintained) Depressive Symptoms:  Feeling bad about self  (Status: maintained)  Goals:   Eathon experiences symptoms of anxiety with minimal depression.   Target Date: 2022-04-29 Frequency: Weekly  Progress: 0 Modality: individual    Therapist will provide referrals for additional resources as appropriate.  Therapist will provide psycho-education regarding Tajay's diagnosis and corresponding treatment approaches and interventions. Licensed Clinical Social Worker, [place], LCSW will support the patient's ability to achieve the goals identified. will employ CBT, BA, Problem-solving, Solution Focused, Mindfulness,  coping skills, & other evidenced-based practices will be used to promote progress towards healthy functioning to help manage decrease symptoms associated with his diagnosis.   Reduce overall level, frequency, and intensity of the feelings of depression, anxiety and panic evidenced by decreased overall symptoms including decreased anxiety, irritability and negative cognitions from 6 to 7 days/week to 0 to 1 days/week per client report for at least 3 consecutive months. Verbally express understanding of the relationship between feelings of depression, anxiety and their impact on thinking patterns and  behaviors. Verbalize an understanding of the role that  distorted thinking plays in creating fears, excessive worry, and ruminations.  [name] participated in the creation of the treatment plan)  Buena Irish, LCSW

## 2021-10-13 NOTE — Telephone Encounter (Signed)
Patients last OV 05/29/21 t his script filled by historical provider please as to refill?

## 2021-10-14 NOTE — Telephone Encounter (Signed)
I do not fill this medication for any patient. This was prescribed by the patient's dermatologist and he needs to request a refill from them.

## 2021-10-15 ENCOUNTER — Encounter: Payer: Self-pay | Admitting: *Deleted

## 2021-10-19 ENCOUNTER — Other Ambulatory Visit: Payer: Self-pay | Admitting: Family Medicine

## 2021-11-02 ENCOUNTER — Encounter: Payer: 59 | Admitting: Psychology

## 2021-11-02 NOTE — Progress Notes (Signed)
This encounter was created in error - please disregard.

## 2021-11-25 DIAGNOSIS — J301 Allergic rhinitis due to pollen: Secondary | ICD-10-CM | POA: Diagnosis not present

## 2021-12-02 DIAGNOSIS — J301 Allergic rhinitis due to pollen: Secondary | ICD-10-CM | POA: Diagnosis not present

## 2021-12-18 ENCOUNTER — Ambulatory Visit (INDEPENDENT_AMBULATORY_CARE_PROVIDER_SITE_OTHER): Payer: 59 | Admitting: Family Medicine

## 2021-12-18 ENCOUNTER — Encounter: Payer: Self-pay | Admitting: Family Medicine

## 2021-12-18 VITALS — BP 110/70 | HR 63 | Temp 98.5°F | Ht 73.0 in | Wt 200.6 lb

## 2021-12-18 DIAGNOSIS — R7989 Other specified abnormal findings of blood chemistry: Secondary | ICD-10-CM

## 2021-12-18 DIAGNOSIS — Z0000 Encounter for general adult medical examination without abnormal findings: Secondary | ICD-10-CM

## 2021-12-18 NOTE — Progress Notes (Signed)
?Tommi Rumps, MD ?Phone: [phone] ? ?Philip Cordova is a 56 y.o. male who presents today for CPE. ? ?Diet: very healthy ?Exercise: 7 days a week ?Colonoscopy: 08/14/20, 5 year recall ?Prostate cancer screening: UTD ?Family history- ? Prostate cancer: no ? Colon cancer: no ?Vaccines-  ? Flu: out of season ? Tetanus: UTD ? Shingles: UTD ? COVID19: x2 ?HIV screening: UTD ?Hep C Screening: UTD ?Tobacco use: no ?Alcohol use: no ?Illicit Drug use: occasional weed ?Dentist: yes ?Ophthalmology: yes ? ?Reports he has seen dermatology for HPV wart treatment and is doing better now. Is also taking valtrex 1 tablet daily to suppress herpes outbreaks. ? ?Had labs through his endocrinologist in [place]. Lipid panel was well controlled. Glucose minimally elevated. TSH was 11.4 with free T3 3.4 and free T4 0.85. notes he was fasting for those labs and his TSH elevated when he is fasting.  ? ? ?Active Ambulatory Problems  ?  Diagnosis Date Noted  ? 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 Asthma 06/22/2012  ? History of squamous cell carcinoma 05/18/2016  ? Routine general medical examination at a health care facility 05/16/2020  ? BPH (benign prostatic hyperplasia) 12/16/2014  ? Herpes 12/16/2020  ? 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 Elevated TSH 02/23/2021  ? Neck pain 02/23/2021  ? Neuropathy 02/23/2021  ? ?Resolved Ambulatory Problems  ?  Diagnosis Date Noted  ? Microcytic hypochromic anemia 01/08/2014  ? Cervical lymphadenopathy 04/27/2016  ? Mucositis due to antineoplastic therapy 08/25/2016  ? Dehydration with hyponatremia 08/25/2016  ? Oral bleeding 08/25/2016  ? Dysgeusia 08/25/2016  ? Insomnia due to medical condition 08/25/2016  ? Dehydration 09/03/2016  ? Acute radial nerve palsy of left upper extremity 11/04/2016  ? Pneumonia 11/10/2016  ? Anxiety and depression 11/10/2016  ? Neck pain 01/14/2017  ? Lesion of soft palate 02/11/2017  ? Otitis externa 06/10/2017  ? BRBPR (bright red  blood per rectum) 05/16/2020  ? Screen for colon cancer   ? Rectal polyp   ? Polyp of ascending colon   ? Rash 12/16/2020  ? Stress 05/27/2021  ? Adjustment disorder with anxiety 08/07/2021  ? ?Past Medical History:  ?Diagnosis Date  ? Athlete's heart   ? Chickenpox   ? 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 ? ?Family History  ?Problem Relation Age of Onset  ? Cancer Father   ?     Skin  ? Hyperlipidemia Father   ? Hypertension Father   ?     Possible  ? Diabetes Paternal Uncle   ? ? ?Social History  ? ?Socioeconomic History  ? Marital status: Single  ?  Spouse name: Not on file  ? Number of children: Not on file  ? Years of education: Not on file  ? Highest education level: Not on file  ?Occupational History  ? Not on file  ?Tobacco Use  ? Smoking status: Former  ?  Years: 1.00  ?  Types: Cigarettes  ? Smokeless tobacco: Never  ? Tobacco comments:  ?  "YEARS AGO"  ?Vaping Use  ? Vaping Use: Never used  ?Substance and Sexual Activity  ? Alcohol use: No  ?  Comment: Occasionally  ? Drug use: No  ? Sexual activity: Not on file  ?Other Topics Concern  ? Not on file  ?Social History Narrative  ? 1 dog   ? No kids  ? Single   ? ?Social Determinants of Health  ? ?Financial Resource Strain: Not on file  ?  Food Insecurity: Not on file  ?Transportation Needs: Not on file  ?Physical Activity: Not on file  ?Stress: Not on file  ?Social Connections: Not on file  ?Intimate Partner Violence: Not on file  ? ? ?ROS ? ?General:  Negative for nexplained weight loss, fever ?Skin: Negative for new or changing mole, sore that won't heal ?HEENT: Negative for trouble hearing, trouble seeing, ringing in ears, mouth sores, hoarseness, change in voice, dysphagia. ?CV:  Negative for chest pain, dyspnea, edema, palpitations ?Resp: Negative for cough, dyspnea, hemoptysis ?GI: Negative for nausea, vomiting, diarrhea, constipation, abdominal pain, melena, hematochezia. ?GU: Negative for dysuria, incontinence, urinary  hesitance, hematuria, vaginal or penile discharge, polyuria, sexual difficulty, lumps in testicle or breasts ?MSK: Negative for muscle cramps or aches, joint pain or swelling ?Neuro: Negative for headaches, weakness, numbness, dizziness, passing out/fainting ?Psych: Negative for depression, anxiety, memory problems ? ?Objective ? ?Physical Exam ?Vitals:  ? 12/18/21 1545  ?BP: 110/70  ?Pulse: 63  ?Temp: 98.5 ?F (36.9 ?C)  ?SpO2: 97%  ? ? ?BP Readings from Last 3 Encounters:  ?12/18/21 110/70  ?09/15/21 116/78  ?07/31/21 109/71  ? ?Wt Readings from Last 3 Encounters:  ?12/18/21 200 lb 9.6 oz (91 kg)  ?09/15/21 201 lb (91.2 kg)  ?07/31/21 200 lb 6.4 oz (90.9 kg)  ? ? ?Physical Exam ?Constitutional:   ?   General: He is not in acute distress. ?   Appearance: He is not diaphoretic.  ?HENT:  ?   Head: Normocephalic and atraumatic.  ?Cardiovascular:  ?   Rate and Rhythm: Normal rate and regular rhythm.  ?   Heart sounds: Normal heart sounds.  ?Pulmonary:  ?   Effort: Pulmonary effort is normal.  ?   Breath sounds: Normal breath sounds.  ?Abdominal:  ?   General: Bowel sounds are normal. There is no distension.  ?   Palpations: Abdomen is soft.  ?   Tenderness: There is no abdominal tenderness.  ?Musculoskeletal:  ?   Right lower leg: No edema.  ?   Left lower leg: No edema.  ?Skin: ?   General: Skin is warm and dry.  ?Neurological:  ?   Mental Status: He is alert.  ?Psychiatric:     ?   Mood and Affect: Mood normal.  ? ? ? ?Assessment/Plan:  ? ?Problem List Items Addressed This Visit   ? ? Elevated TSH  ? Relevant Orders  ? TSH  ? T3, free  ? T4, free  ? Routine general medical examination at a health care facility - Primary  ?  Physical exam completed.  I encouraged continued healthy diet and exercise.  Prostate cancer and colon cancer screening are up-to-date.  Patient declines further COVID vaccines.  Counseled on seeing ophthalmology once yearly given that he is on Plaquenil.  I did review his lab work.  We will plan  on rechecking a TSH and free T3 and free T4 today. ? ?  ?  ? ? ?Return in about 1 year (around 12/19/2022) for CPE. ? ?This visit occurred during the SARS-CoV-2 public health emergency.  Safety protocols were in place, including screening questions prior to the visit, additional usage of staff PPE, and extensive cleaning of exam room while observing appropriate contact time as indicated for disinfecting solutions.  ? ? ?Tommi Rumps, MD ?[place] ? ?

## 2021-12-18 NOTE — Assessment & Plan Note (Signed)
Physical exam completed.  I encouraged continued healthy diet and exercise.  Prostate cancer and colon cancer screening are up-to-date.  Patient declines further COVID vaccines.  Counseled on seeing ophthalmology once yearly given that he is on Plaquenil.  I did review his lab work.  We will plan on rechecking a TSH and free T3 and free T4 today. ?

## 2021-12-19 LAB — T4, FREE: Free T4: 0.7 ng/dL — ABNORMAL LOW (ref 0.8–1.8)

## 2021-12-19 LAB — TSH: TSH: 4.23 mIU/L (ref 0.40–4.50)

## 2021-12-19 LAB — T3, FREE: T3, Free: 3.1 pg/mL (ref 2.3–4.2)

## 2022-01-12 ENCOUNTER — Other Ambulatory Visit: Payer: Self-pay | Admitting: Family Medicine

## 2022-01-12 DIAGNOSIS — B009 Herpesviral infection, unspecified: Secondary | ICD-10-CM

## 2022-01-13 MED ORDER — VALACYCLOVIR HCL 1 G PO TABS: ORAL_TABLET | ORAL | 0 refills | Status: DC

## 2022-02-03 ENCOUNTER — Telehealth: Payer: Self-pay

## 2022-02-03 ENCOUNTER — Ambulatory Visit: Payer: 59 | Admitting: Gastroenterology

## 2022-02-03 NOTE — Telephone Encounter (Signed)
Patient LVM requesting an appt with Dr. Marius Ditch for another Hemorrhoid Banding.  Thanks, [place], [place]

## 2022-02-03 NOTE — Telephone Encounter (Signed)
Yes, I can see him today or tomorrow  RV

## 2022-02-03 NOTE — Telephone Encounter (Signed)
Made appointment tomorrow at 11:45

## 2022-02-03 NOTE — Telephone Encounter (Signed)
Patient states that for less then a week he has been having rectal bleeding with bowel movement. He states it is a stream of blood and fills the toilet. He denies any other symptoms. He is wanting to know if he can having another banding procedure if he can have it can we overbook.

## 2022-02-04 ENCOUNTER — Encounter: Payer: Self-pay | Admitting: Gastroenterology

## 2022-02-04 ENCOUNTER — Ambulatory Visit: Payer: 59 | Admitting: Gastroenterology

## 2022-02-04 VITALS — BP 115/62 | HR 65 | Temp 98.6°F | Ht 73.0 in | Wt 202.4 lb

## 2022-02-04 DIAGNOSIS — K642 Third degree hemorrhoids: Secondary | ICD-10-CM

## 2022-02-04 DIAGNOSIS — K625 Hemorrhage of anus and rectum: Secondary | ICD-10-CM

## 2022-02-04 DIAGNOSIS — D500 Iron deficiency anemia secondary to blood loss (chronic): Secondary | ICD-10-CM

## 2022-02-04 NOTE — Progress Notes (Signed)
Philip Darby, MD [address]  [place]  [place], [place] [address]  Main: [phone]  Fax: [phone]    Gastroenterology Consultation  Referring Provider:     [name], MD Primary Care Physician:  [name], MD Primary Gastroenterologist:  Dr. Sherri Sear Reason for Consultation:   Painless rectal bleeding, iron deficiency anemia        HPI:   Philip Cordova is a 56 y.o. male referred by Dr. [name], MD  for consultation & management of chronic history of intermittent painless rectal bleeding leading to iron deficiency anemia.  Patient has history of prolapsed internal hemorrhoids for several years.  Patient was seen by Dr. Bonna Gains in early October due to worsening of rectal bleeding, he described it as stream of blood per rectum.  He was going through a lot of stress moving her mom to a retirement home and dealing with her properties and financial matters and was not concentrating on his diet and overall health.  He reports that the severity of bleeding has decreased, still has ongoing bright red blood per rectum.  He is referred to me to evaluate for hemorrhoid banding.  Patient reports that he has to manually reduce the hemorrhoids every time he has a bowel movement.  He spends about 15 to 20 minutes during each bowel movement, goes up to 4-5 daily.  He consumes about 50 to 75 g of fiber daily, he does not eat any processed foods, avoids daily.  He believes in organic foods.  He is quite active, able to maintain weight.  He is taking oral iron for iron deficiency anemia.  Follow up visit 02/04/2022 Patient made an urgent visit to discuss about hemorrhoid banding.  Patient called our office yesterday because he was experiencing recurrence of rectal bleeding last week which lasted for few days.  He reports painless bright red blood per rectum, dripping into the toilet.  He reports that he had red meat that day followed by some straining which has  resulted in rectal bleeding.  He underwent hemorrhoid ligation in the past.  He denies any other GI symptoms   Patient had history of head and neck cancer, s/p surgical resection, chemo XRT in 2017  Patient denies any other hemorrhoidal symptoms  Patient does not smoke or drink alcohol  NSAIDs: None  Antiplts/Anticoagulants/Anti thrombotics: None  Patient denies family history of GI malignancy  GI Procedures:  Colonoscopy 08/14/2020 - Three 5 to 8 mm polyps in the sigmoid colon and in the ascending colon, removed with a cold snare. Resected and retrieved. - One 4 mm polyp in the rectum, removed with a cold biopsy forceps. Resected and retrieved. - Diverticulosis in the ascending colon. - The examination was otherwise normal. - The rectum, sigmoid colon, descending colon, transverse colon, ascending colon and cecum are normal. - Non-bleeding internal hemorrhoids.  DIAGNOSIS:  A. COLON POLYP X4, ASCENDING, SIGMOID, AND RECTUM; COLD BIOPSY AND COLD  SNARE:  - FEATURES SUGGESTIVE OF SESSILE SERRATED POLYP, ONE FRAGMENT.  - HYPERPLASTIC POLYP, MULTIPLE FRAGMENTS.  - NEGATIVE FOR DYSPLASIA AND MALIGNANCY.    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     Past Surgical History:  Procedure Laterality Date   APPENDECTOMY  2008  COLONOSCOPY WITH PROPOFOL N/A 08/14/2020   Procedure: COLONOSCOPY WITH PROPOFOL;  Surgeon: Virgel Manifold, MD;  Location: [facility] ENDOSCOPY;  Service: Endoscopy;  Laterality: N/A;   DIAGNOSTIC LARYNGOSCOPY  06/17/2016   Procedure: DIAGNOSTIC LARYNGOSCOPY WITH BIOPSIES;  Surgeon: Carloyn Manner, MD;  Location: [facility] ORS;  Service: ENT;;   Moles removed  2005   Shell N/A 06/17/2016   Procedure: TONSILLECTOMY;  Surgeon: Carloyn Manner, MD;  Location: [facility]  ORS;  Service: ENT;  Laterality: N/A;   TONSILLECTOMY      Current Outpatient Medications:    albuterol (PROVENTIL HFA) 108 (90 Base) MCG/ACT inhaler, INHALE 1 PUFF INTO THE LUNGS EVERY 6 HOURS AS NEEDED FOR WHEEZING., Disp: 6.7 g, Rfl: 0   EPINEPHrine 0.3 mg/0.3 mL IJ SOAJ injection, Inject into the muscle., Disp: 1 each, Rfl: 0   Ferrous Sulfate (IRON) 90 (18 Fe) MG TABS, Take by mouth., Disp: , Rfl:    fluticasone-salmeterol (ADVAIR DISKUS) 250-50 MCG/ACT AEPB, Inhale 1 puff into the lungs in the morning and at bedtime. Rinse mouth further refills PCP, Disp: 60 each, Rfl: 2   hydroxychloroquine (PLAQUENIL) 200 MG tablet, , Disp: , Rfl:    Multiple Vitamins-Minerals (MULTIVITAMIN WITH MINERALS) tablet, Take by mouth., Disp: , Rfl:    neomycin-polymyxin-hydrocortisone (CORTISPORIN) OTIC solution, Place 4 drops into both ears 3 (three) times daily., Disp: 10 mL, Rfl: 0   Omega-3 Fatty Acids (FISH OIL) 1000 MG CAPS, Take 1 capsule by mouth daily. , Disp: , Rfl:    podofilox (CONDYLOX) 0.5 % gel, Spot treat affected as needed., Disp: 3.5 g, Rfl: 2   testosterone cypionate (DEPOTESTOTERONE CYPIONATE) 100 MG/ML injection, Inject into the muscle every 14 (fourteen) days. For IM use only, Disp: , Rfl:    valACYclovir (VALTREX) 1000 MG tablet, Take 1 tablet by mouth once daily, Disp: 90 tablet, Rfl: 0    Family History  Problem Relation Age of Onset   Cancer Father        Skin   Hyperlipidemia Father    Hypertension Father        Possible   Diabetes Paternal Uncle      Social History   Tobacco Use   Smoking status: Former    Years: 1.00    Types: Cigarettes   Smokeless tobacco: Never   Tobacco comments:    "YEARS AGO"  Vaping Use   Vaping Use: Never used  Substance Use Topics   Alcohol use: No    Comment: Occasionally   Drug use: No    Allergies as of 02/04/2022 - Review Complete 02/04/2022  Allergen Reaction Noted   Cat hair extract Other (See Comments) 03/25/2021    Milk-related compounds  03/23/2012    Review of Systems:    All systems reviewed and negative except where noted in HPI.   Physical Exam:  BP 115/62 (BP Location: Right Arm, Patient Position: Sitting, Cuff Size: Normal)   Pulse 65   Temp 98.6 F (37 C) (Oral)   Ht '6\' 1"'$  (1.854 m)   Wt 202 lb 6.4 oz (91.8 kg)   BMI 26.70 kg/m  No LMP for male patient.  General:   Alert,  Well-developed, well-nourished, pleasant and cooperative in NAD Head:  Normocephalic and atraumatic. Eyes:  Sclera clear, no icterus.   Conjunctiva pink. Ears:  Normal auditory acuity. Nose:  No deformity, discharge, or lesions. Mouth:  No deformity or lesions,oropharynx pink & moist. Neck:  Supple; no masses or thyromegaly. Lungs:  Respirations even and unlabored.  Clear throughout to auscultation.   No wheezes, crackles, or rhonchi. No acute distress. Heart:  Regular rate and rhythm; no murmurs, clicks, rubs, or gallops. Abdomen:  Normal bowel sounds. Soft, non-tender and non-distended without masses, hepatosplenomegaly or hernias noted.  No guarding or rebound tenderness.   Rectal: Normal perianal skin, nontender digital rectal exam Msk:  Symmetrical without gross deformities. Good, equal movement & strength bilaterally. Pulses:  Normal pulses noted. Extremities:  No clubbing or edema.  No cyanosis. Neurologic:  Alert and oriented x3;  grossly normal neurologically. Skin:  Intact without significant lesions or rashes. No jaundice. Psych:  Alert and cooperative. Normal mood and affect.  Imaging Studies: No abdominal imaging  Assessment and Plan:   Jenson Beedle is a 56 y.o. 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  Patient underwent ligation of right posterior and left lateral hemorrhoids in the past   Recurrent rectal bleeding from prolapsed internal hemorrhoids: Grade 3  Discussed about risks and benefits of the hemorrhoid ligation procedure, consent obtained Proceed with hemorrhoid ligation today   PROCEDURE NOTE: The patient presents with symptomatic grade 3 hemorrhoids, unresponsive to maximal medical therapy, requesting rubber band ligation of his/her hemorrhoidal disease.  All risks, benefits and alternative forms of therapy were described and informed consent was obtained.  The decision was made to band the RP internal hemorrhoid, and the [place] was used to perform band ligation without complication.  Digital anorectal examination was then performed to assure proper positioning of the band, and to adjust the banded tissue as required.  The patient was discharged home without pain or other issues.  Dietary and behavioral recommendations were given and (if necessary - prescriptions were given), along with follow-up instructions.  The patient will return 2 weeks for follow-up and possible additional banding as required.  No complications were encountered and the patient tolerated the procedure well.   Follow up in 2 weeks   Philip Darby, MD

## 2022-02-09 ENCOUNTER — Telehealth: Payer: Self-pay | Admitting: Family Medicine

## 2022-02-09 DIAGNOSIS — B009 Herpesviral infection, unspecified: Secondary | ICD-10-CM

## 2022-02-09 MED ORDER — VALACYCLOVIR HCL 1 G PO TABS: ORAL_TABLET | ORAL | 0 refills | Status: DC

## 2022-02-09 NOTE — Telephone Encounter (Signed)
Pt called in requesting for refill on medication (valACYclovir (VALTREX) 1000 MG tablet)... Pt requesting callback

## 2022-02-10 MED ORDER — VALACYCLOVIR HCL 1 G PO TABS: ORAL_TABLET | ORAL | 0 refills | Status: DC

## 2022-02-10 NOTE — Addendum Note (Signed)
Addended by: Fulton Mole D on: 02/10/2022 04:16 PM   Modules accepted: Orders

## 2022-02-10 NOTE — Telephone Encounter (Signed)
Medication sent to pharmacy.  Philip Cordova,cma  ?

## 2022-02-15 DIAGNOSIS — J301 Allergic rhinitis due to pollen: Secondary | ICD-10-CM | POA: Diagnosis not present

## 2022-02-18 DIAGNOSIS — J301 Allergic rhinitis due to pollen: Secondary | ICD-10-CM | POA: Diagnosis not present

## 2022-02-24 ENCOUNTER — Ambulatory Visit: Payer: 59 | Admitting: Gastroenterology

## 2022-02-24 ENCOUNTER — Encounter: Payer: Self-pay | Admitting: Gastroenterology

## 2022-02-24 VITALS — BP 122/75 | HR 80 | Temp 98.7°F | Ht 73.0 in | Wt 202.0 lb

## 2022-02-24 DIAGNOSIS — K648 Other hemorrhoids: Secondary | ICD-10-CM | POA: Diagnosis not present

## 2022-02-24 NOTE — Progress Notes (Signed)
PROCEDURE NOTE: The patient presents with symptomatic grade 2 hemorrhoids, unresponsive to maximal medical therapy, requesting rubber band ligation of his hemorrhoidal disease.  All risks, benefits and alternative forms of therapy were described and informed consent was obtained.  The decision was made to band the RA internal hemorrhoid, and the [place] was used to perform band ligation without complication.  Digital anorectal examination was then performed to assure proper positioning of the band, and to adjust the banded tissue as required.  The patient was discharged home without pain or other issues.  Dietary and behavioral recommendations were given and (if necessary - prescriptions were given), along with follow-up instructions.  The patient will return as needed for follow-up and possible additional banding as required.  No complications were encountered and the patient tolerated the procedure well.

## 2022-03-15 ENCOUNTER — Other Ambulatory Visit: Payer: Self-pay | Admitting: Family Medicine

## 2022-03-15 DIAGNOSIS — B009 Herpesviral infection, unspecified: Secondary | ICD-10-CM

## 2022-03-15 MED ORDER — VALACYCLOVIR HCL 1 G PO TABS: ORAL_TABLET | ORAL | 0 refills | Status: DC

## 2022-03-23 LAB — BASIC METABOLIC PANEL: CO2: 25 — AB (ref 13–22)

## 2022-03-24 ENCOUNTER — Telehealth: Payer: Self-pay

## 2022-03-24 NOTE — Telephone Encounter (Signed)
Patient brought in lab results from [facility] for some medication refills and I put the results in the lab basket.  Sacha Topor,cma

## 2022-03-25 LAB — LIPID PANEL
Cholesterol: 154 (ref 0–200)
HDL: 45 (ref 35–70)
LDL Cholesterol: 95
Triglycerides: 69 (ref 40–160)

## 2022-03-25 LAB — COMPREHENSIVE METABOLIC PANEL
Albumin: 4.3 (ref 3.5–5.0)
Calcium: 9.8 (ref 8.7–10.7)
Globulin: 3
eGFR: 72

## 2022-03-25 LAB — HEPATIC FUNCTION PANEL
ALT: 29 U/L (ref 10–40)
AST: 30 (ref 14–40)
Alkaline Phosphatase: 59 (ref 25–125)
Bilirubin, Total: 0.5

## 2022-03-25 LAB — BASIC METABOLIC PANEL
BUN: 19 (ref 4–21)
Chloride: 102 (ref 99–108)
Creatinine: 1.2 (ref <=1.3)
Glucose: 94
Potassium: 4.4 mEq/L (ref 3.5–5.1)
Sodium: 142 (ref 137–147)

## 2022-03-25 LAB — PSA: PSA: 1.2

## 2022-03-25 LAB — CBC AND DIFFERENTIAL
HCT: 49 (ref 41–53)
Hemoglobin: 16.6 (ref 13.5–17.5)
Neutrophils Absolute: 10.2
Platelets: 301 10*3/uL (ref 150–400)
WBC: 12.3

## 2022-03-25 LAB — CBC: RBC: 5.16 — AB (ref 3.87–5.11)

## 2022-03-25 LAB — TESTOSTERONE: Testosterone: 672

## 2022-03-25 LAB — TSH: TSH: 6.43 — AB (ref <=5.90)

## 2022-03-26 NOTE — Telephone Encounter (Signed)
I do not manage testosterone replacement therapy as it is a high risk medication. I refer patients to urology or endocrinology to manage this medication. I can refer him to a specialist for this. Let me know if he is ok with the referral.

## 2022-03-26 NOTE — Telephone Encounter (Signed)
I called the patient and he stated that it was for his testosterone injections and a pill he takes right after and he has been on this for 1 year and his insurance will cover.  Jeronica Stlouis,cma

## 2022-03-26 NOTE — Telephone Encounter (Signed)
Noted  

## 2022-03-26 NOTE — Telephone Encounter (Signed)
Pt called stating he has an appointment with his old urologist on 8/10 and they take his insurance

## 2022-03-26 NOTE — Telephone Encounter (Signed)
Labs reviewed. What refills does he need? I only prescribe his valtrex and advair.

## 2022-03-26 NOTE — Telephone Encounter (Signed)
I called and spoke with the patient and he is okay with the referral.  Lateesha Bezold,cma

## 2022-04-01 ENCOUNTER — Ambulatory Visit: Payer: Self-pay | Admitting: Urology

## 2022-04-02 ENCOUNTER — Encounter: Payer: Self-pay | Admitting: Family Medicine

## 2022-04-02 ENCOUNTER — Ambulatory Visit (INDEPENDENT_AMBULATORY_CARE_PROVIDER_SITE_OTHER): Payer: 59 | Admitting: Family Medicine

## 2022-04-02 DIAGNOSIS — G5621 Lesion of ulnar nerve, right upper limb: Secondary | ICD-10-CM

## 2022-04-02 DIAGNOSIS — G5620 Lesion of ulnar nerve, unspecified upper limb: Secondary | ICD-10-CM | POA: Insufficient documentation

## 2022-04-02 NOTE — Assessment & Plan Note (Signed)
Discussed that the patient likely has nerve compression at the ulnar nerve in his elbow.  This is likely from keeping his elbow flexed and from having his elbow rest on his guitar.  He has discontinued playing guitar over the last week.  Discussed he should continue to hold off on playing guitar.  Discussed trying a cubital tunnel brace.  I advised against icing the area given risk of nerve damage.  If its not improving over the next several weeks we could have him see orthopedics.

## 2022-04-02 NOTE — Progress Notes (Signed)
Acute 

## 2022-04-02 NOTE — Progress Notes (Signed)
Tommi Rumps, MD Phone: [phone]  Philip Cordova is a 56 y.o. male who presents today for same-day visit.  Ulnar neuropathy: Patient notes onset of numbness and tingling in his right pinky within the last several weeks.  He has been playing guitar and having his elbow flexed and having the guitar press on his ulnar aspect of his elbow.  He has been playing the guitar a lot over a number of months.  He notes no significant weakness.  He has no elbow pain or neck pain.  No injuries.  Social History   Tobacco Use  Smoking Status Former   Years: 1.00   Types: Cigarettes  Smokeless Tobacco Never  Tobacco Comments   "YEARS AGO"    Current Outpatient Medications on File Prior to Visit  Medication Sig Dispense Refill   albuterol (PROVENTIL HFA) 108 (90 Base) MCG/ACT inhaler INHALE 1 PUFF INTO THE LUNGS EVERY 6 HOURS AS NEEDED FOR WHEEZING. 6.7 g 0   EPINEPHrine 0.3 mg/0.3 mL IJ SOAJ injection Inject into the muscle. 1 each 0   Ferrous Sulfate (IRON) 90 (18 Fe) MG TABS Take by mouth.     fluticasone-salmeterol (ADVAIR DISKUS) 250-50 MCG/ACT AEPB Inhale 1 puff into the lungs in the morning and at bedtime. Rinse mouth further refills PCP 60 each 2   hydroxychloroquine (PLAQUENIL) 200 MG tablet      Multiple Vitamins-Minerals (MULTIVITAMIN WITH MINERALS) tablet Take by mouth.     neomycin-polymyxin-hydrocortisone (CORTISPORIN) OTIC solution Place 4 drops into both ears 3 (three) times daily. 10 mL 0   Omega-3 Fatty Acids (FISH OIL) 1000 MG CAPS Take 1 capsule by mouth daily.      podofilox (CONDYLOX) 0.5 % gel Spot treat affected as needed. 3.5 g 2   testosterone cypionate (DEPOTESTOTERONE CYPIONATE) 100 MG/ML injection Inject into the muscle every 14 (fourteen) days. For IM use only     valACYclovir (VALTREX) 1000 MG tablet Take 1 tablet by mouth once daily 90 tablet 0   No current facility-administered medications on file prior to visit.     ROS see history of present  illness  Objective  Physical Exam Vitals:   04/02/22 1343  BP: 100/70  Pulse: 81  Temp: 98.3 F (36.8 C)  SpO2: 96%    BP Readings from Last 3 Encounters:  04/02/22 100/70  02/24/22 122/75  02/04/22 115/62   Wt Readings from Last 3 Encounters:  04/02/22 205 lb 6.4 oz (93.2 kg)  02/24/22 202 lb (91.6 kg)  02/04/22 202 lb 6.4 oz (91.8 kg)    Physical Exam Musculoskeletal:     Comments: Negative Tinel's at the right elbow  Neurological:     Comments: 5/5 bilateral grip strength, biceps, and triceps strength, 5/5 interosseous strength, 5/5 flexion and extension of the right pinky, sensation light touch intact bilateral upper extremities      Assessment/Plan: Please see individual problem list.  Problem List Items Addressed This Visit     Ulnar neuropathy at elbow    Discussed that the patient likely has nerve compression at the ulnar nerve in his elbow.  This is likely from keeping his elbow flexed and from having his elbow rest on his guitar.  He has discontinued playing guitar over the last week.  Discussed he should continue to hold off on playing guitar.  Discussed trying a cubital tunnel brace.  I advised against icing the area given risk of nerve damage.  If its not improving over the next several weeks we could have  him see orthopedics.       Return if symptoms worsen or fail to improve.   Tommi Rumps, MD [place]

## 2022-04-08 ENCOUNTER — Ambulatory Visit: Payer: 59 | Admitting: Urology

## 2022-04-08 ENCOUNTER — Encounter: Payer: Self-pay | Admitting: Urology

## 2022-04-08 VITALS — BP 159/66 | HR 97 | Ht 73.0 in | Wt 200.0 lb

## 2022-04-08 DIAGNOSIS — R7989 Other specified abnormal findings of blood chemistry: Secondary | ICD-10-CM

## 2022-04-08 DIAGNOSIS — E291 Testicular hypofunction: Secondary | ICD-10-CM | POA: Diagnosis not present

## 2022-04-08 NOTE — Progress Notes (Signed)
04/08/2022 10:55 AM   [name] 1965/10/02 [id number]  Referring provider: [name], MD [address] [place],  [place] [address]  Chief Complaint  Patient presents with   Hypogonadism    HPI: Philip Cordova is a 56 y.o. male who presents for hypogonadism.  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  Presently on 100 mg testosterone weekly and anastrozole 1 mg weekly His insurance plan recently changed than TRT is now covered and he desires a local provider Last labs 03/25/2022: Testosterone 672 ng/dL/Free T14.2; PSA 1.2; hematocrit 48.2 and estradiol 11.4   PMH: Past Medical History:  Diagnosis Date   Asthma    Athlete's heart    STATES HR STAYS AROUND 46   Chickenpox    Herpes 12/16/2020   Squamous cell carcinoma of head and neck ([place]) 04/09/2016   Surgical resection, chemo + rad tx's.     Surgical History: Past Surgical History:  Procedure Laterality Date   APPENDECTOMY  2008   COLONOSCOPY WITH PROPOFOL N/A 08/14/2020   Procedure: COLONOSCOPY WITH PROPOFOL;  Surgeon: Virgel Manifold, MD;  Location: [facility] ENDOSCOPY;  Service: Endoscopy;  Laterality: N/A;   DIAGNOSTIC LARYNGOSCOPY  06/17/2016   Procedure: DIAGNOSTIC LARYNGOSCOPY WITH BIOPSIES;  Surgeon: Carloyn Manner, MD;  Location: [facility] ORS;  Service: ENT;;   Moles removed  2005   [place] N/A 06/17/2016   Procedure: TONSILLECTOMY;  Surgeon: Carloyn Manner, MD;  Location: [facility] ORS;  Service: ENT;  Laterality: N/A;   TONSILLECTOMY      Home Medications:  Allergies as of 04/08/2022       Reactions   Cat Hair Extract Other (See Comments)    Milk-related Compounds         Medication List        Accurate as of April 08, 2022 10:55 AM. If you have any questions, ask your nurse or doctor.          albuterol 108 (90 Base) MCG/ACT inhaler Commonly known as: Proventil HFA INHALE 1 PUFF INTO THE LUNGS EVERY 6 HOURS AS NEEDED FOR WHEEZING.   Condylox 0.5 % gel Generic drug: podofilox Spot treat affected as needed.   EPINEPHrine 0.3 mg/0.3 mL Soaj injection Commonly known as: EPI-PEN Inject into the muscle.   Fish Oil 1000 MG Caps Take 1 capsule by mouth daily.   fluticasone-salmeterol 250-50 MCG/ACT Aepb Commonly known as: Advair Diskus Inhale 1 puff into the lungs in the morning and at bedtime. Rinse mouth further refills PCP   hydroxychloroquine 200 MG tablet Commonly known as: PLAQUENIL   Iron 90 (18 Fe) MG Tabs Take by mouth.   multivitamin with minerals tablet Take by mouth.   neomycin-polymyxin-hydrocortisone OTIC solution Commonly known as: CORTISPORIN Place 4 drops into both ears 3 (three) times daily.   testosterone cypionate 100 MG/ML injection Commonly known as: DEPOTESTOTERONE CYPIONATE Inject into the muscle every 14 (fourteen) days. For IM use only   valACYclovir 1000 MG tablet Commonly known as: VALTREX Take 1 tablet by mouth once daily        Allergies:  Allergies  Allergen Reactions   Cat Hair Extract Other (  See Comments)   Milk-Related Compounds     Family History: Family History  Problem Relation Age of Onset   Cancer Father        Skin   Hyperlipidemia Father    Hypertension Father        Possible   Diabetes Paternal Uncle     Social History:  reports that he has quit smoking. His smoking use included cigarettes. He has never used smokeless tobacco. He reports that he does not drink alcohol and does not use drugs.   Physical Exam: BP (!) 159/66   Pulse 97   Ht '6\' 1"'$  (1.854 m)   Wt 200 lb (90.7 kg)   BMI 26.39 kg/m   Constitutional:  Alert and oriented, No  acute distress. HEENT: [place] AT Respiratory: Normal respiratory effort, no increased work of breathing. Psychiatric: Normal mood and affect.   Assessment & Plan:    1.  Hypogonadism Symptoms of tiredness, fatigue, decreased libido Marked improvement on TRT He will continue his present testosterone and anastrozole dose; refill sent to pharmacy Lab visit 6 months for testosterone, hematocrit Office visit 1 year for testosterone, hematocrit, PSA    Abbie Sons, [facility] [address] [place], [place] [address] [phone]

## 2022-04-14 MED ORDER — TESTOSTERONE CYPIONATE 100 MG/ML IM SOLN
100.0000 mg | INTRAMUSCULAR | 0 refills | Status: DC
Start: 2022-04-14 — End: 2022-04-25

## 2022-04-14 MED ORDER — ANASTROZOLE 1 MG PO TABS
1.0000 mg | ORAL_TABLET | ORAL | 0 refills | Status: DC
Start: 2022-04-14 — End: 2023-10-07

## 2022-04-20 ENCOUNTER — Other Ambulatory Visit: Payer: Self-pay | Admitting: Urology

## 2022-04-21 ENCOUNTER — Other Ambulatory Visit: Payer: Self-pay | Admitting: Family Medicine

## 2022-04-21 DIAGNOSIS — B009 Herpesviral infection, unspecified: Secondary | ICD-10-CM

## 2022-04-22 MED ORDER — VALACYCLOVIR HCL 1 G PO TABS: ORAL_TABLET | ORAL | 0 refills | Status: DC

## 2022-04-25 ENCOUNTER — Other Ambulatory Visit: Payer: Self-pay | Admitting: Urology

## 2022-04-25 MED ORDER — TESTOSTERONE CYPIONATE 200 MG/ML IM SOLN: 100.0000 mg | INTRAMUSCULAR | 0 refills | Status: DC

## 2022-04-27 ENCOUNTER — Telehealth: Payer: Self-pay | Admitting: Family Medicine

## 2022-04-27 NOTE — Telephone Encounter (Signed)
Patient notified Testosterone cypionate has been approved. Approval: 04/26/2022-04/26/2025

## 2022-05-12 DIAGNOSIS — J301 Allergic rhinitis due to pollen: Secondary | ICD-10-CM | POA: Diagnosis not present

## 2022-05-19 DIAGNOSIS — J301 Allergic rhinitis due to pollen: Secondary | ICD-10-CM | POA: Diagnosis not present

## 2022-05-25 ENCOUNTER — Telehealth: Payer: Self-pay | Admitting: Family Medicine

## 2022-05-25 NOTE — Telephone Encounter (Signed)
Patient called and states the testosterone cypionate was sent in for 0.5cc weekly and he states he has been doing 1cc weekly. He states he was getting care from [facility] in [place]. I informed that that we will need office notes from that office. He is to come by our office and sign a medical records release to get the office notes.

## 2022-06-03 ENCOUNTER — Ambulatory Visit: Payer: 59 | Admitting: Dermatology

## 2022-06-03 DIAGNOSIS — M310 Hypersensitivity angiitis: Secondary | ICD-10-CM

## 2022-06-03 DIAGNOSIS — R21 Rash and other nonspecific skin eruption: Secondary | ICD-10-CM

## 2022-06-03 DIAGNOSIS — L820 Inflamed seborrheic keratosis: Secondary | ICD-10-CM | POA: Diagnosis not present

## 2022-06-03 DIAGNOSIS — L570 Actinic keratosis: Secondary | ICD-10-CM | POA: Diagnosis not present

## 2022-06-03 DIAGNOSIS — L821 Other seborrheic keratosis: Secondary | ICD-10-CM

## 2022-06-03 DIAGNOSIS — N481 Balanitis: Secondary | ICD-10-CM | POA: Diagnosis not present

## 2022-06-03 MED ORDER — MOMETASONE FUROATE 0.1 % EX CREA: 1.0000 | TOPICAL_CREAM | CUTANEOUS | 1 refills | Status: DC

## 2022-06-03 NOTE — Patient Instructions (Addendum)
Cryotherapy Aftercare  Wash gently with soap and water everyday.   Apply Vaseline and Band-Aid daily until healed.     Due to recent changes in healthcare laws, you may see results of your pathology and/or laboratory studies on MyChart before the doctors have had a chance to review them. We understand that in some cases there may be results that are confusing or concerning to you. Please understand that not all results are received at the same time and often the doctors may need to interpret multiple results in order to provide you with the best plan of care or course of treatment. Therefore, we ask that you please give us 2 business days to thoroughly review all your results before contacting the office for clarification. Should we see a critical lab result, you will be contacted sooner.   If You Need Anything After Your Visit  If you have any questions or concerns for your doctor, please call our main line at [phone] and press option 4 to reach your doctor's medical assistant. If no one answers, please leave a voicemail as directed and we will return your call as soon as possible. Messages left after 4 pm will be answered the following business day.   You may also send us a message via MyChart. We typically respond to MyChart messages within 1-2 business days.  For prescription refills, please ask your pharmacy to contact our office. Our fax number is [phone].  If you have an urgent issue when the clinic is closed that cannot wait until the next business day, you can page your doctor at the number below.    Please note that while we do our best to be available for urgent issues outside of office hours, we are not available 24/7.   If you have an urgent issue and are unable to reach us, you may choose to seek medical care at your doctor's office, retail clinic, urgent care center, or emergency room.  If you have a medical emergency, please immediately call 911 or go to the  emergency department.  Pager Numbers  - Dr. Kowalski: [phone]  - Dr. Moye: [phone]  - Dr. Stewart: [phone]  In the event of inclement weather, please call our main line at [phone] for an update on the status of any delays or closures.  Dermatology Medication Tips: Please keep the boxes that topical medications come in in order to help keep track of the instructions about where and how to use these. Pharmacies typically print the medication instructions only on the boxes and not directly on the medication tubes.   If your medication is too expensive, please contact our office at [phone] option 4 or send us a message through MyChart.   We are unable to tell what your co-pay for medications will be in advance as this is different depending on your insurance coverage. However, we may be able to find a substitute medication at lower cost or fill out paperwork to get insurance to cover a needed medication.   If a prior authorization is required to get your medication covered by your insurance company, please allow us 1-2 business days to complete this process.  Drug prices often vary depending on where the prescription is filled and some pharmacies may offer cheaper prices.  The website www.goodrx.com contains coupons for medications through different pharmacies. The prices here do not account for what the cost may be with help from insurance (it may be cheaper with your insurance), but the website can   give you the price if you did not use any insurance.  - You can print the associated coupon and take it with your prescription to the pharmacy.  - You may also stop by our office during regular business hours and pick up a GoodRx coupon card.  - If you need your prescription sent electronically to a different pharmacy, notify our office through [facility] MyChart or by phone at [phone] option 4.     Si Usted Necesita Algo Despus de Su Visita  Tambin puede  enviarnos un mensaje a travs de MyChart. Por lo general respondemos a los mensajes de MyChart en el transcurso de 1 a 2 das hbiles.  Para renovar recetas, por favor pida a su farmacia que se ponga en contacto con nuestra oficina. Nuestro nmero de fax es el [phone].  Si tiene un asunto urgente cuando la clnica est cerrada y que no puede esperar hasta el siguiente da hbil, puede llamar/localizar a su doctor(a) al nmero que aparece a continuacin.   Por favor, tenga en cuenta que aunque hacemos todo lo posible para estar disponibles para asuntos urgentes fuera del horario de oficina, no estamos disponibles las 24 horas del da, los 7 das de la semana.   Si tiene un problema urgente y no puede comunicarse con nosotros, puede optar por buscar atencin mdica  en el consultorio de su doctor(a), en una clnica privada, en un centro de atencin urgente o en una sala de emergencias.  Si tiene una emergencia mdica, por favor llame inmediatamente al 911 o vaya a la sala de emergencias.  Nmeros de bper  - Dr. Kowalski: [phone]  - Dra. Moye: [phone]  - Dra. Stewart: [phone]  En caso de inclemencias del tiempo, por favor llame a nuestra lnea principal al [phone] para una actualizacin sobre el estado de cualquier retraso o cierre.  Consejos para la medicacin en dermatologa: Por favor, guarde las cajas en las que vienen los medicamentos de uso tpico para ayudarle a seguir las instrucciones sobre dnde y cmo usarlos. Las farmacias generalmente imprimen las instrucciones del medicamento slo en las cajas y no directamente en los tubos del medicamento.   Si su medicamento es muy caro, por favor, pngase en contacto con nuestra oficina llamando al [phone] y presione la opcin 4 o envenos un mensaje a travs de MyChart.   No podemos decirle cul ser su copago por los medicamentos por adelantado ya que esto es diferente dependiendo de la cobertura de su seguro.  Sin embargo, es posible que podamos encontrar un medicamento sustituto a menor costo o llenar un formulario para que el seguro cubra el medicamento que se considera necesario.   Si se requiere una autorizacin previa para que su compaa de seguros cubra su medicamento, por favor permtanos de 1 a 2 das hbiles para completar este proceso.  Los precios de los medicamentos varan con frecuencia dependiendo del lugar de dnde se surte la receta y alguna farmacias pueden ofrecer precios ms baratos.  El sitio web www.goodrx.com tiene cupones para medicamentos de diferentes farmacias. Los precios aqu no tienen en cuenta lo que podra costar con la ayuda del seguro (puede ser ms barato con su seguro), pero el sitio web puede darle el precio si no utiliz ningn seguro.  - Puede imprimir el cupn correspondiente y llevarlo con su receta a la farmacia.  - Tambin puede pasar por nuestra oficina durante el horario de atencin regular y recoger una tarjeta de cupones de GoodRx.  -   Si necesita que su receta se enve electrnicamente a una farmacia diferente, informe a nuestra oficina a travs de MyChart de [facility] o por telfono llamando al [phone] y presione la opcin 4.  

## 2022-06-03 NOTE — Progress Notes (Signed)
Follow-Up Visit   Subjective  Philip Cordova is a 56 y.o. male who presents for the following: check spots (L [place], 2-9m painful/Spot genital area, hx of Genital warts). The patient has spots, moles and lesions to be evaluated, some may be new or changing and the patient has concerns that these could be cancer.  The following portions of the chart were reviewed this encounter and updated as appropriate:   Tobacco  Allergies  Meds  Problems  Med Hx  Surg Hx  Fam Hx     Review of Systems:  No other skin or systemic complaints except as noted in HPI or Assessment and Plan.  Objective  Well appearing patient in no apparent distress; mood and affect are within normal limits.  A focused examination was performed including face, groin. Relevant physical exam findings are noted in the Assessment and Plan.  L [place] x 1 Pink scaly macules  R forehead x 5 (5) Stuck on waxy paps with erythema  chest Clear per pt  penis 0.8 x 0.5cm pink patch on L glans penis, patchy erythema R glans   Assessment & Plan   Seborrheic Keratoses - Stuck-on, waxy, tan-brown papules and/or plaques  - Benign-appearing - Discussed benign etiology and prognosis. - Observe - Call for any changes - face  AK (actinic keratosis) L [place] x 1 Destruction of lesion - L [place] x 1 Complexity: simple   Destruction method: cryotherapy   Informed consent: discussed and consent obtained   Timeout:  patient name, date of birth, surgical site, and procedure verified Lesion destroyed using liquid nitrogen: Yes   Region frozen until ice ball extended beyond lesion: Yes   Outcome: patient tolerated procedure well with no complications   Post-procedure details: wound care instructions given    Inflamed seborrheic keratosis (5) R forehead x 5 Symptomatic, irritating, patient would like treated. 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  Patient empirically treated with oral doxycycline 100 twice daily for a month due to these results and his clinical picture of annular plaques on the chest.  - cleared. Rash clear today  Patient being followed by Dr. PPosey Pronto-rheumatologist  Complicated condition, but seems to be doing better now.  Balanitis - new rash penis 0.8 x 0.5 cm pink oval patch of glans penis. Could this be part of the Vasculitis from past? Or a new issue? Reassured this does not appear to be an STD at this time.  Start Mometasone cr qd/bid up to 5d/wk until clear  Topical steroids (such as triamcinolone, fluocinolone, fluocinonide, mometasone, clobetasol, halobetasol, betamethasone, hydrocortisone) can cause thinning and lightening of the skin if they are used for too long in the same area. Your physician has selected the right strength medicine for your problem and area affected on the body. Please use your medication only as  directed by your physician to prevent side effects.    mometasone (ELOCON) 0.1 % cream - penis Apply 1 Application topically as directed. Qd to bid up to 5 days a week aa groin until clear  Return in about 6 weeks (around 07/15/2022) for balanitis f/u, AK f/u.  I, Othelia Pulling, RMA, am acting as scribe for Sarina Ser, MD . Documentation: I have reviewed the  above documentation for accuracy and completeness, and I agree with the above.  Sarina Ser, MD

## 2022-06-08 ENCOUNTER — Encounter: Payer: Self-pay | Admitting: Dermatology

## 2022-06-14 ENCOUNTER — Other Ambulatory Visit: Payer: Self-pay | Admitting: Urology

## 2022-06-14 MED ORDER — TESTOSTERONE CYPIONATE 200 MG/ML IM SOLN: 100.0000 mg | INTRAMUSCULAR | 0 refills | Status: DC

## 2022-06-16 DIAGNOSIS — Z79899 Other long term (current) drug therapy: Secondary | ICD-10-CM | POA: Diagnosis not present

## 2022-06-16 DIAGNOSIS — M359 Systemic involvement of connective tissue, unspecified: Secondary | ICD-10-CM | POA: Diagnosis not present

## 2022-07-06 DIAGNOSIS — J301 Allergic rhinitis due to pollen: Secondary | ICD-10-CM | POA: Diagnosis not present

## 2022-07-06 DIAGNOSIS — Z85818 Personal history of malignant neoplasm of other sites of lip, oral cavity, and pharynx: Secondary | ICD-10-CM | POA: Diagnosis not present

## 2022-07-06 DIAGNOSIS — Z08 Encounter for follow-up examination after completed treatment for malignant neoplasm: Secondary | ICD-10-CM | POA: Diagnosis not present

## 2022-07-07 ENCOUNTER — Other Ambulatory Visit: Payer: Self-pay | Admitting: Urology

## 2022-07-08 ENCOUNTER — Other Ambulatory Visit: Payer: Self-pay | Admitting: Urology

## 2022-07-14 ENCOUNTER — Other Ambulatory Visit: Payer: Self-pay | Admitting: Family Medicine

## 2022-07-14 DIAGNOSIS — B009 Herpesviral infection, unspecified: Secondary | ICD-10-CM

## 2022-07-16 MED ORDER — VALACYCLOVIR HCL 1 G PO TABS: ORAL_TABLET | ORAL | 0 refills | Status: DC

## 2022-07-19 ENCOUNTER — Ambulatory Visit: Payer: 59 | Admitting: Dermatology

## 2022-07-19 DIAGNOSIS — D229 Melanocytic nevi, unspecified: Secondary | ICD-10-CM

## 2022-07-19 DIAGNOSIS — L570 Actinic keratosis: Secondary | ICD-10-CM | POA: Diagnosis not present

## 2022-07-19 DIAGNOSIS — Z85828 Personal history of other malignant neoplasm of skin: Secondary | ICD-10-CM | POA: Diagnosis not present

## 2022-07-19 DIAGNOSIS — L814 Other melanin hyperpigmentation: Secondary | ICD-10-CM | POA: Diagnosis not present

## 2022-07-19 DIAGNOSIS — L820 Inflamed seborrheic keratosis: Secondary | ICD-10-CM | POA: Diagnosis not present

## 2022-07-19 DIAGNOSIS — L821 Other seborrheic keratosis: Secondary | ICD-10-CM

## 2022-07-19 DIAGNOSIS — Z8589 Personal history of malignant neoplasm of other organs and systems: Secondary | ICD-10-CM

## 2022-07-19 DIAGNOSIS — L578 Other skin changes due to chronic exposure to nonionizing radiation: Secondary | ICD-10-CM | POA: Diagnosis not present

## 2022-07-19 DIAGNOSIS — Z1283 Encounter for screening for malignant neoplasm of skin: Secondary | ICD-10-CM

## 2022-07-19 NOTE — Progress Notes (Signed)
Follow-Up Visit   Subjective  Philip Cordova is a 56 y.o. male who presents for the following: Annual Exam (Hx AKs, SCC, ISKs - balanitis resolved after using Mometasone 0.1% cream, previously treated Aks and ISKs resolved with LN2). The patient presents for Total-Body Skin Exam (TBSE) for skin cancer screening and mole check.  The patient has spots, moles and lesions to be evaluated, some may be new or changing and the patient has concerns that these could be cancer.  The following portions of the chart were reviewed this encounter and updated as appropriate:   Tobacco  Allergies  Meds  Problems  Med Hx  Surg Hx  Fam Hx     Review of Systems:  No other skin or systemic complaints except as noted in HPI or Assessment and Plan.  Objective  Well appearing patient in no apparent distress; mood and affect are within normal limits.  A full examination was performed including scalp, head, eyes, ears, nose, lips, neck, chest, axillae, abdomen, back, buttocks, bilateral upper extremities, bilateral lower extremities, hands, feet, fingers, toes, fingernails, and toenails. All findings within normal limits unless otherwise noted below.  Forehead x 1, nose x 1 (2) Erythematous thin papules/macules with gritty scale.    Assessment & Plan  AK (actinic keratosis) x 2 Forehead x 1, nose x 1 Destruction of lesion - Forehead x 1, nose x 1 Complexity: simple   Destruction method: cryotherapy   Informed consent: discussed and consent obtained   Timeout:  patient name, date of birth, surgical site, and procedure verified Lesion destroyed using liquid nitrogen: Yes   Region frozen until ice ball extended beyond lesion: Yes   Outcome: patient tolerated procedure well with no complications   Post-procedure details: wound care instructions given    Inflamed seborrheic keratosis x 8 R med knee x 1, lower legs x 5, groin x 1 Symptomatic, irritating, patient would like treated. Destruction of lesion  - R med knee x 1, lower legs x 5, groin x 1 Complexity: simple   Destruction method: cryotherapy   Informed consent: discussed and consent obtained   Timeout:  patient name, date of birth, surgical site, and procedure verified Lesion destroyed using liquid nitrogen: Yes   Region frozen until ice ball extended beyond lesion: Yes   Outcome: patient tolerated procedure well with no complications   Post-procedure details: wound care instructions given    Lentigines - Scattered tan macules - Due to sun exposure - Benign-appearing, observe - Recommend daily broad spectrum sunscreen SPF 30+ to sun-exposed areas, reapply every 2 hours as needed. - Call for any changes  Seborrheic Keratoses - Stuck-on, waxy, tan-brown papules and/or plaques  - Benign-appearing - Discussed benign etiology and prognosis. - Observe - Call for any changes  Melanocytic Nevi - Tan-brown and/or pink-flesh-colored symmetric macules and papules - Benign appearing on exam today - Observation - Call clinic for new or changing moles - Recommend daily use of broad spectrum spf 30+ sunscreen to sun-exposed areas.   Hemangiomas - Red papules - Discussed benign nature - Observe - Call for any changes  Actinic Damage - Chronic condition, secondary to cumulative UV/sun exposure - diffuse scaly erythematous macules with underlying dyspigmentation - Recommend daily broad spectrum sunscreen SPF 30+ to sun-exposed areas, reapply every 2 hours as needed.  - Staying in the shade or wearing long sleeves, sun glasses (UVA+UVB protection) and wide brim hats (4-inch brim around the entire circumference of the hat) are also recommended for sun protection.  -  Call for new or changing lesions.  History of Squamous Cell Carcinoma of the Skin - No evidence of recurrence today - No lymphadenopathy - Recommend regular full body skin exams - Recommend daily broad spectrum sunscreen SPF 30+ to sun-exposed areas, reapply every 2  hours as needed.  - Call if any new or changing lesions are noted between office visits  Skin cancer screening performed today.  Return in about 1 year (around 07/20/2023) for TBSE.  Luther Redo, CMA, am acting as scribe for Sarina Ser, MD . Documentation: I have reviewed the above documentation for accuracy and completeness, and I agree with the above.  Sarina Ser, MD

## 2022-07-19 NOTE — Patient Instructions (Signed)
Due to recent changes in healthcare laws, you may see results of your pathology and/or laboratory studies on MyChart before the doctors have had a chance to review them. We understand that in some cases there may be results that are confusing or concerning to you. Please understand that not all results are received at the same time and often the doctors may need to interpret multiple results in order to provide you with the best plan of care or course of treatment. Therefore, we ask that you please give us 2 business days to thoroughly review all your results before contacting the office for clarification. Should we see a critical lab result, you will be contacted sooner.   If You Need Anything After Your Visit  If you have any questions or concerns for your doctor, please call our main line at [phone] and press option 4 to reach your doctor's medical assistant. If no one answers, please leave a voicemail as directed and we will return your call as soon as possible. Messages left after 4 pm will be answered the following business day.   You may also send us a message via MyChart. We typically respond to MyChart messages within 1-2 business days.  For prescription refills, please ask your pharmacy to contact our office. Our fax number is [phone].  If you have an urgent issue when the clinic is closed that cannot wait until the next business day, you can page your doctor at the number below.    Please note that while we do our best to be available for urgent issues outside of office hours, we are not available 24/7.   If you have an urgent issue and are unable to reach us, you may choose to seek medical care at your doctor's office, retail clinic, urgent care center, or emergency room.  If you have a medical emergency, please immediately call 911 or go to the emergency department.  Pager Numbers  - Dr. Kowalski: [phone]  - Dr. Moye: [phone]  - Dr. Stewart:  [phone]  In the event of inclement weather, please call our main line at [phone] for an update on the status of any delays or closures.  Dermatology Medication Tips: Please keep the boxes that topical medications come in in order to help keep track of the instructions about where and how to use these. Pharmacies typically print the medication instructions only on the boxes and not directly on the medication tubes.   If your medication is too expensive, please contact our office at [phone] option 4 or send us a message through MyChart.   We are unable to tell what your co-pay for medications will be in advance as this is different depending on your insurance coverage. However, we may be able to find a substitute medication at lower cost or fill out paperwork to get insurance to cover a needed medication.   If a prior authorization is required to get your medication covered by your insurance company, please allow us 1-2 business days to complete this process.  Drug prices often vary depending on where the prescription is filled and some pharmacies may offer cheaper prices.  The website www.goodrx.com contains coupons for medications through different pharmacies. The prices here do not account for what the cost may be with help from insurance (it may be cheaper with your insurance), but the website can give you the price if you did not use any insurance.  - You can print the associated coupon and take it with   your prescription to the pharmacy.  - You may also stop by our office during regular business hours and pick up a GoodRx coupon card.  - If you need your prescription sent electronically to a different pharmacy, notify our office through [facility] MyChart or by phone at [phone] option 4.     Si Usted Necesita Algo Despus de Su Visita  Tambin puede enviarnos un mensaje a travs de MyChart. Por lo general respondemos a los mensajes de MyChart en el transcurso de 1 a 2  das hbiles.  Para renovar recetas, por favor pida a su farmacia que se ponga en contacto con nuestra oficina. Nuestro nmero de fax es el [phone].  Si tiene un asunto urgente cuando la clnica est cerrada y que no puede esperar hasta el siguiente da hbil, puede llamar/localizar a su doctor(a) al nmero que aparece a continuacin.   Por favor, tenga en cuenta que aunque hacemos todo lo posible para estar disponibles para asuntos urgentes fuera del horario de oficina, no estamos disponibles las 24 horas del da, los 7 das de la semana.   Si tiene un problema urgente y no puede comunicarse con nosotros, puede optar por buscar atencin mdica  en el consultorio de su doctor(a), en una clnica privada, en un centro de atencin urgente o en una sala de emergencias.  Si tiene una emergencia mdica, por favor llame inmediatamente al 911 o vaya a la sala de emergencias.  Nmeros de bper  - Dr. Kowalski: [phone]  - Dra. Moye: [phone]  - Dra. Stewart: [phone]  En caso de inclemencias del tiempo, por favor llame a nuestra lnea principal al [phone] para una actualizacin sobre el estado de cualquier retraso o cierre.  Consejos para la medicacin en dermatologa: Por favor, guarde las cajas en las que vienen los medicamentos de uso tpico para ayudarle a seguir las instrucciones sobre dnde y cmo usarlos. Las farmacias generalmente imprimen las instrucciones del medicamento slo en las cajas y no directamente en los tubos del medicamento.   Si su medicamento es muy caro, por favor, pngase en contacto con nuestra oficina llamando al [phone] y presione la opcin 4 o envenos un mensaje a travs de MyChart.   No podemos decirle cul ser su copago por los medicamentos por adelantado ya que esto es diferente dependiendo de la cobertura de su seguro. Sin embargo, es posible que podamos encontrar un medicamento sustituto a menor costo o llenar un formulario para que el  seguro cubra el medicamento que se considera necesario.   Si se requiere una autorizacin previa para que su compaa de seguros cubra su medicamento, por favor permtanos de 1 a 2 das hbiles para completar este proceso.  Los precios de los medicamentos varan con frecuencia dependiendo del lugar de dnde se surte la receta y alguna farmacias pueden ofrecer precios ms baratos.  El sitio web www.goodrx.com tiene cupones para medicamentos de diferentes farmacias. Los precios aqu no tienen en cuenta lo que podra costar con la ayuda del seguro (puede ser ms barato con su seguro), pero el sitio web puede darle el precio si no utiliz ningn seguro.  - Puede imprimir el cupn correspondiente y llevarlo con su receta a la farmacia.  - Tambin puede pasar por nuestra oficina durante el horario de atencin regular y recoger una tarjeta de cupones de GoodRx.  - Si necesita que su receta se enve electrnicamente a una farmacia diferente, informe a nuestra oficina a travs de MyChart de [facility]   o por telfono llamando al [phone] y presione la opcin 4.  

## 2022-08-02 ENCOUNTER — Encounter: Payer: Self-pay | Admitting: Urology

## 2022-08-04 DIAGNOSIS — J301 Allergic rhinitis due to pollen: Secondary | ICD-10-CM | POA: Diagnosis not present

## 2022-08-05 ENCOUNTER — Encounter: Payer: Self-pay | Admitting: Dermatology

## 2022-08-12 DIAGNOSIS — J301 Allergic rhinitis due to pollen: Secondary | ICD-10-CM | POA: Diagnosis not present

## 2022-08-14 ENCOUNTER — Other Ambulatory Visit: Payer: Self-pay | Admitting: Family Medicine

## 2022-08-14 DIAGNOSIS — B009 Herpesviral infection, unspecified: Secondary | ICD-10-CM

## 2022-08-16 ENCOUNTER — Ambulatory Visit: Payer: 59 | Admitting: Dermatology

## 2022-08-16 MED ORDER — ALBUTEROL SULFATE HFA 108 (90 BASE) MCG/ACT IN AERS: INHALATION_SPRAY | RESPIRATORY_TRACT | 1 refills | Status: DC

## 2022-08-16 MED ORDER — VALACYCLOVIR HCL 1 G PO TABS: ORAL_TABLET | ORAL | 1 refills | Status: DC

## 2022-08-25 ENCOUNTER — Other Ambulatory Visit: Payer: Self-pay | Admitting: Urology

## 2022-08-26 MED ORDER — TESTOSTERONE CYPIONATE 200 MG/ML IM SOLN: 200.0000 mg | INTRAMUSCULAR | 0 refills | Status: DC

## 2022-08-31 ENCOUNTER — Other Ambulatory Visit: Payer: Self-pay | Admitting: Urology

## 2022-09-23 ENCOUNTER — Other Ambulatory Visit: Payer: Self-pay | Admitting: Family Medicine

## 2022-09-24 ENCOUNTER — Telehealth: Payer: Self-pay | Admitting: Urology

## 2022-09-24 MED ORDER — EPINEPHRINE 0.3 MG/0.3ML IJ SOAJ: INTRAMUSCULAR | 0 refills | Status: DC

## 2022-10-11 ENCOUNTER — Other Ambulatory Visit: Payer: 59

## 2022-10-11 DIAGNOSIS — R7989 Other specified abnormal findings of blood chemistry: Secondary | ICD-10-CM

## 2022-10-12 LAB — TESTOSTERONE: Testosterone: 794 ng/dL (ref 264–916)

## 2022-10-12 LAB — HEMATOCRIT: Hematocrit: 52.3 % — ABNORMAL HIGH (ref 37.5–51.0)

## 2022-10-13 ENCOUNTER — Encounter: Payer: Self-pay | Admitting: *Deleted

## 2022-10-29 ENCOUNTER — Other Ambulatory Visit: Payer: Self-pay | Admitting: Urology

## 2022-10-29 MED ORDER — TESTOSTERONE CYPIONATE 200 MG/ML IM SOLN: 200.0000 mg | INTRAMUSCULAR | 0 refills | Status: DC

## 2022-11-10 DIAGNOSIS — J301 Allergic rhinitis due to pollen: Secondary | ICD-10-CM | POA: Diagnosis not present

## 2022-11-16 ENCOUNTER — Telehealth: Payer: Self-pay | Admitting: Family Medicine

## 2022-11-16 NOTE — Telephone Encounter (Unsigned)
Prescription Request  11/16/2022  LOV: 04/02/2022  What is the name of the medication or equipment? fluticasone-salmeterol (ADVAIR DISKUS) 250-50 MCG/ACT AEPB ,valACYclovir (VALTREX) 1000 MG tablet   Have you contacted your pharmacy to request a refill? Yes   Which pharmacy would you like this sent to?  [facility] [place], [place] - [address] [place] [address] Phone: [phone] Fax: [phone]    Patient notified that their request is being sent to the clinical staff for review and that they should receive a response within 2 business days.   Please advise at Mobile [phone] (mobile)

## 2022-11-17 ENCOUNTER — Other Ambulatory Visit: Payer: Self-pay | Admitting: Urology

## 2022-11-17 ENCOUNTER — Other Ambulatory Visit: Payer: Self-pay | Admitting: *Deleted

## 2022-11-17 DIAGNOSIS — J45909 Unspecified asthma, uncomplicated: Secondary | ICD-10-CM

## 2022-11-17 MED ORDER — FLUTICASONE-SALMETEROL 250-50 MCG/ACT IN AEPB: 1.0000 | INHALATION_SPRAY | Freq: Two times a day (BID) | RESPIRATORY_TRACT | 2 refills | Status: DC

## 2022-11-17 NOTE — Telephone Encounter (Signed)
Refill sent.

## 2022-11-18 DIAGNOSIS — J301 Allergic rhinitis due to pollen: Secondary | ICD-10-CM | POA: Diagnosis not present

## 2022-11-19 ENCOUNTER — Ambulatory Visit (INDEPENDENT_AMBULATORY_CARE_PROVIDER_SITE_OTHER): Payer: 59 | Admitting: Family Medicine

## 2022-11-19 ENCOUNTER — Encounter: Payer: Self-pay | Admitting: Family Medicine

## 2022-11-19 VITALS — BP 116/78 | HR 78 | Temp 97.6°F | Ht 73.0 in | Wt 201.4 lb

## 2022-11-19 DIAGNOSIS — G5621 Lesion of ulnar nerve, right upper limb: Secondary | ICD-10-CM | POA: Diagnosis not present

## 2022-11-19 DIAGNOSIS — G5763 Lesion of plantar nerve, bilateral lower limbs: Secondary | ICD-10-CM

## 2022-11-19 DIAGNOSIS — G5760 Lesion of plantar nerve, unspecified lower limb: Secondary | ICD-10-CM | POA: Insufficient documentation

## 2022-11-19 DIAGNOSIS — B009 Herpesviral infection, unspecified: Secondary | ICD-10-CM

## 2022-11-19 MED ORDER — VALACYCLOVIR HCL 1 G PO TABS: ORAL_TABLET | ORAL | 1 refills | Status: DC

## 2022-11-19 NOTE — Assessment & Plan Note (Addendum)
Chronic issue.  Refer to sports medicine to consider ultrasound and possible injection versus the need for surgery.

## 2022-11-19 NOTE — Assessment & Plan Note (Signed)
Patient could have a Morton's neuroma based on his description.  We will have him see sports medicine to see if there is anything they have to offer for him.

## 2022-11-19 NOTE — Assessment & Plan Note (Addendum)
Chronic intermittent issue.  Patient can continue Valtrex 1 tablet by mouth once daily.

## 2022-11-19 NOTE — Progress Notes (Signed)
Tommi Rumps, MD Phone: [phone]  Philip Cordova is a 57 y.o. male who presents today for same day visit.   Cubital tunnel syndrome: Patient continues to have issues with numbing sensation into his right hand and forearm.  This is along the ulnar aspect of his right lower arm.  He used the splint for several months without any benefit.  He wonders what the neck step would be.  Numbness in feet: Patient reports numbness in the balls of his feet that have been going on intermittently for 5 to 10 years.  Notes it feels like there is a rock in the area where the numbness is.  Notes it bothers him more when he walks a lot.  Social History   Tobacco Use  Smoking Status Former   Years: 1   Types: Cigarettes  Smokeless Tobacco Never  Tobacco Comments   "YEARS AGO"    Current Outpatient Medications on File Prior to Visit  Medication Sig Dispense Refill   albuterol (PROVENTIL HFA) 108 (90 Base) MCG/ACT inhaler INHALE 1 PUFF INTO THE LUNGS EVERY 6 HOURS AS NEEDED FOR WHEEZING. 6.7 g 1   anastrozole (ARIMIDEX) 1 MG tablet Take 1 tablet (1 mg total) by mouth once a week. 30 tablet 0   EPINEPHrine 0.3 mg/0.3 mL IJ SOAJ injection Inject into the muscle. 1 each 0   Ferrous Sulfate (IRON) 90 (18 Fe) MG TABS Take by mouth.     fluticasone-salmeterol (ADVAIR DISKUS) 250-50 MCG/ACT AEPB Inhale 1 puff into the lungs in the morning and at bedtime. Rinse mouth further refills PCP 60 each 2   hydroxychloroquine (PLAQUENIL) 200 MG tablet      mometasone (ELOCON) 0.1 % cream Apply 1 Application topically as directed. Qd to bid up to 5 days a week aa groin until clear 50 g 1   Multiple Vitamins-Minerals (MULTIVITAMIN WITH MINERALS) tablet Take by mouth.     neomycin-polymyxin-hydrocortisone (CORTISPORIN) OTIC solution Place 4 drops into both ears 3 (three) times daily. 10 mL 0   Omega-3 Fatty Acids (FISH OIL) 1000 MG CAPS Take 1 capsule by mouth daily.      podofilox (CONDYLOX) 0.5 % gel Spot treat  affected as needed. 3.5 g 2   testosterone cypionate (DEPOTESTOSTERONE CYPIONATE) 200 MG/ML injection Inject 1 mL (200 mg total) into the muscle once a week. 10 mL 0   No current facility-administered medications on file prior to visit.     ROS see history of present illness  Objective  Physical Exam Vitals:   11/19/22 1639  BP: 116/78  Pulse: 78  Temp: 97.6 F (36.4 C)  SpO2: 97%    BP Readings from Last 3 Encounters:  11/19/22 116/78  04/08/22 (!) 159/66  04/02/22 100/70   Wt Readings from Last 3 Encounters:  11/19/22 201 lb 6.4 oz (91.4 kg)  04/08/22 200 lb (90.7 kg)  04/02/22 205 lb 6.4 oz (93.2 kg)    Physical Exam Constitutional:      General: He is not in acute distress.    Appearance: He is not diaphoretic.  Musculoskeletal:     Comments: No numbness in either hand, 5/5 strength bilateral grip  Neurological:     Mental Status: He is alert.      Assessment/Plan: Please see individual problem list.  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  Refer to sports medicine to consider ultrasound and possible injection versus the need for surgery.  Orders: -  Ambulatory referral to Sports Medicine  Herpes Assessment & Plan: Chronic intermittent issue.  Patient can continue Valtrex 1 tablet by mouth once daily.  Orders: -     valACYclovir HCl; Take 1 tablet by mouth once daily  Dispense: 90 tablet; Refill: 1  Morton's neuroma of both feet Assessment & Plan: Patient could have a Morton's neuroma based on his description.  We will have him see sports medicine to see if there is anything they have to offer for him.  Orders: -     Ambulatory referral to Sports Medicine    Return if symptoms worsen or fail to improve.   Tommi Rumps, MD [place]

## 2022-11-29 NOTE — Progress Notes (Unsigned)
   Shirlyn Goltz, PhD, LAT, ATC acting as a scribe for Lynne Leader, MD.  Subjective:    CC: Right elbow pain and bilat foot numbness  HPI: Patient is a 57 year old male presenting with right elbow pain ongoing***.  Patient locates pain to ***.  Radiates: Grip strength: Paresthesias: Aggravates: Treatments tried:  Patient also c/o bilateral numbness in both of his feet ongoing intermittently for 5 to 10 years.  Patient locates area of concern to***  Aggravates: Increase walking Treatments tried:   Pertinent review of Systems: ***  Relevant historical information: ***   Objective:   There were no vitals filed for this visit. General: Well Developed, well nourished, and in no acute distress.   MSK: ***  Lab and Radiology Results No results found for this or any previous visit (from the past 72 hour(s)). No results found.    Impression and Recommendations:    Assessment and Plan: 57 y.o. male with ***.  PDMP not reviewed this encounter. No orders of the defined types were placed in this encounter.  No orders of the defined types were placed in this encounter.   Discussed warning signs or symptoms. Please see discharge instructions. Patient expresses understanding.   ***

## 2022-11-30 ENCOUNTER — Other Ambulatory Visit: Payer: Self-pay

## 2022-11-30 ENCOUNTER — Ambulatory Visit: Payer: 59 | Admitting: Family Medicine

## 2022-11-30 VITALS — BP 152/94 | HR 71 | Ht 73.0 in | Wt 202.0 lb

## 2022-11-30 DIAGNOSIS — M79671 Pain in right foot: Secondary | ICD-10-CM | POA: Diagnosis not present

## 2022-11-30 DIAGNOSIS — G5621 Lesion of ulnar nerve, right upper limb: Secondary | ICD-10-CM

## 2022-11-30 DIAGNOSIS — M79672 Pain in left foot: Secondary | ICD-10-CM | POA: Diagnosis not present

## 2022-11-30 NOTE — Patient Instructions (Addendum)
Thank you for coming in today.    Lets try OT with [name].   Try Metatarsal pads in your shoes at [employer].   Recheck in 1 month.
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
## 2022-12-07 ENCOUNTER — Encounter: Payer: Self-pay | Admitting: Rehabilitative and Restorative Service Providers"

## 2022-12-07 ENCOUNTER — Other Ambulatory Visit: Payer: Self-pay

## 2022-12-07 ENCOUNTER — Ambulatory Visit (INDEPENDENT_AMBULATORY_CARE_PROVIDER_SITE_OTHER): Payer: 59 | Admitting: Rehabilitative and Restorative Service Providers"

## 2022-12-07 ENCOUNTER — Other Ambulatory Visit: Payer: Self-pay | Admitting: Urology

## 2022-12-07 DIAGNOSIS — M6281 Muscle weakness (generalized): Secondary | ICD-10-CM | POA: Diagnosis not present

## 2022-12-07 DIAGNOSIS — R202 Paresthesia of skin: Secondary | ICD-10-CM | POA: Diagnosis not present

## 2022-12-08 MED ORDER — TESTOSTERONE CYPIONATE 200 MG/ML IM SOLN: 200.0000 mg | INTRAMUSCULAR | 0 refills | Status: DC

## 2022-12-16 ENCOUNTER — Other Ambulatory Visit: Payer: Self-pay | Admitting: Urology

## 2022-12-16 ENCOUNTER — Telehealth: Payer: Self-pay | Admitting: Urology

## 2022-12-16 NOTE — Telephone Encounter (Signed)
Patient called to request refill for anasastrozole. Pharmacy is [facility] on [address].

## 2022-12-17 ENCOUNTER — Encounter: Payer: 59 | Admitting: Rehabilitative and Restorative Service Providers"

## 2022-12-17 ENCOUNTER — Other Ambulatory Visit: Payer: Self-pay | Admitting: *Deleted

## 2022-12-17 ENCOUNTER — Other Ambulatory Visit: Payer: 59

## 2022-12-17 ENCOUNTER — Encounter: Payer: Self-pay | Admitting: *Deleted

## 2022-12-17 DIAGNOSIS — R7989 Other specified abnormal findings of blood chemistry: Secondary | ICD-10-CM

## 2022-12-17 NOTE — Telephone Encounter (Signed)
Pt called this morning about estradiol level lab.  He wants to know if he can go to [place], because it's easier to get his service dog in and out.

## 2022-12-17 NOTE — Telephone Encounter (Signed)
Need to check an estradiol level

## 2022-12-18 LAB — HEMATOCRIT: Hematocrit: 53.7 % — ABNORMAL HIGH (ref 37.5–51.0)

## 2022-12-18 LAB — ESTRADIOL: Estradiol: 22.2 pg/mL (ref 7.6–42.6)

## 2022-12-18 LAB — TESTOSTERONE: Testosterone: 1266 ng/dL — ABNORMAL HIGH (ref 264–916)

## 2022-12-20 ENCOUNTER — Encounter: Payer: 59 | Admitting: Family Medicine

## 2022-12-20 ENCOUNTER — Encounter: Payer: Self-pay | Admitting: *Deleted

## 2022-12-27 ENCOUNTER — Encounter: Payer: 59 | Admitting: Rehabilitative and Restorative Service Providers"

## 2022-12-30 ENCOUNTER — Ambulatory Visit: Payer: 59 | Admitting: Family Medicine

## 2022-12-31 ENCOUNTER — Other Ambulatory Visit: Payer: Self-pay | Admitting: Family Medicine

## 2022-12-31 DIAGNOSIS — R7989 Other specified abnormal findings of blood chemistry: Secondary | ICD-10-CM

## 2023-01-05 ENCOUNTER — Other Ambulatory Visit: Payer: 59

## 2023-01-05 DIAGNOSIS — R7989 Other specified abnormal findings of blood chemistry: Secondary | ICD-10-CM

## 2023-01-06 ENCOUNTER — Encounter: Payer: 59 | Admitting: Rehabilitative and Restorative Service Providers"

## 2023-01-06 LAB — TESTOSTERONE: Testosterone: 654 ng/dL (ref 264–916)

## 2023-01-08 ENCOUNTER — Other Ambulatory Visit: Payer: Self-pay | Admitting: Urology

## 2023-01-11 MED ORDER — TESTOSTERONE CYPIONATE 200 MG/ML IM SOLN: 200.0000 mg | INTRAMUSCULAR | 0 refills | Status: DC

## 2023-01-13 ENCOUNTER — Other Ambulatory Visit: Payer: Self-pay

## 2023-01-13 ENCOUNTER — Ambulatory Visit (INDEPENDENT_AMBULATORY_CARE_PROVIDER_SITE_OTHER): Payer: 59

## 2023-01-13 ENCOUNTER — Ambulatory Visit: Payer: 59 | Admitting: Family Medicine

## 2023-01-13 VITALS — BP 122/76 | HR 74 | Ht 73.0 in | Wt 195.0 lb

## 2023-01-13 DIAGNOSIS — G8929 Other chronic pain: Secondary | ICD-10-CM | POA: Diagnosis not present

## 2023-01-13 DIAGNOSIS — M25531 Pain in right wrist: Secondary | ICD-10-CM

## 2023-01-13 NOTE — Patient Instructions (Addendum)
Thank you for coming in today.   Please get an Xray today before you leave   Lets try a Wrist Widget brace.   Please use Voltaren gel (Generic Diclofenac Gel) up to 4x daily for pain as needed.  This is available over-the-counter as both the name brand Voltaren gel and the generic diclofenac gel.   Recheck in 1 month.

## 2023-01-13 NOTE — Progress Notes (Signed)
IPhilbert Riser, PhD, LAT, ATC acting as a scribe for [name], MD.  Philip Cordova is a 57 y.o. male who presents to [facility] today for f/u right elbow pain, thought to be due to cubital tunnel syndrome, and bilateral foot paresthesia, thought to be due to Morton's neuroma.  Patient was last seen by Dr. Denyse Amass on 11/30/2022 and was advised to try metatarsal pad and was referred to OT, only completing 1 visit (canceling all remaining scheduled visits).  Today, patient reports R elbow pain is about the same. Pt d/c OT due to not being able to take his dog along. He feels like his feet are feeling pretty good. Pt c/o pain along the ulnar aspect of his R wrist. He relates this to his guitar playing.  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  Pertinent review of systems: No fevers or chills  Relevant historical information: History of squamous cell carcinoma head neck   Exam:  BP 122/76   Pulse 74   Ht 6\' 1"  (1.854 m)   Wt 195 lb (88.5 kg)   SpO2 94%   BMI 25.73 kg/m  General: Well Developed, well nourished, and in no acute distress.   MSK: Right wrist mild swelling ulnar side of wrist.  Tender palpation at ulnar wrist.  Normal wrist motion.  Intact strength. Capillary fill pulses and sensation are intact distally.   Lab and Radiology Results  Diagnostic Limited MSK Ultrasound of: Right ulnar wrist Mild hypoechoic fluid surrounds extensor carpi ulnaris tendon at ulnar styloid.  No visible tendon tear is present. Mild ulnar wrist effusion is present with degeneration of TFCC. Impression: Tenosynovitis ECU tendon and degenerative tearing TFCC.  X-ray images right wrist obtained today personally and independently interpreted No acute fractures.  No severe degenerative changes. Await formal radiology review   Assessment and Plan: 57 y.o. male with right wrist pain.  Based on physical exam and ultrasound examination likely pain  due to ECU tenosynovitis and TFCC degeneration.  There is likely an overuse component as well.  We talked about options.  Plan for a wrist rigid wrist brace and Voltaren gel.  He had good benefit with initial visit with occupational therapy but he has a service dog that is not fully trained enough to be in a busy therapy environment.  He is trying to get the dog better trained so that he can take the dog with him and occupational therapy in the future if needed. Recheck with me in 1 month.  PDMP not reviewed this encounter. Orders Placed This Encounter  Procedures   [place] LIMITED JOINT SPACE STRUCTURES UP RIGHT(NO LINKED CHARGES)    Order Specific Question:   Reason for Exam (SYMPTOM  OR DIAGNOSIS REQUIRED)    Answer:   right wrist pain    Order Specific Question:   Preferred imaging location?    Answer:    [facility] Wrist Complete Right    Standing Status:   Future    Number of Occurrences:   1    Standing Expiration Date:   01/13/2024    Order Specific Question:   Reason for Exam (SYMPTOM  OR DIAGNOSIS REQUIRED)    Answer:   eval wrist pain r    Order Specific Question:   Preferred imaging location?    Answer:   [facility]   No orders of the defined types were placed in this  encounter.    Discussed warning signs or symptoms. Please see discharge instructions. Patient expresses understanding.   The above documentation has been reviewed and is accurate and complete [name], M.D.

## 2023-01-17 NOTE — Progress Notes (Signed)
Right wrist x-ray looks normal to radiology

## 2023-01-20 ENCOUNTER — Encounter: Payer: 59 | Admitting: Rehabilitative and Restorative Service Providers"

## 2023-01-25 DIAGNOSIS — J301 Allergic rhinitis due to pollen: Secondary | ICD-10-CM | POA: Diagnosis not present

## 2023-02-02 ENCOUNTER — Ambulatory Visit (INDEPENDENT_AMBULATORY_CARE_PROVIDER_SITE_OTHER): Payer: 59 | Admitting: Family Medicine

## 2023-02-02 ENCOUNTER — Encounter: Payer: Self-pay | Admitting: Family Medicine

## 2023-02-02 VITALS — BP 120/82 | HR 84 | Temp 98.0°F | Ht 73.0 in | Wt 194.8 lb

## 2023-02-02 DIAGNOSIS — Z0000 Encounter for general adult medical examination without abnormal findings: Secondary | ICD-10-CM

## 2023-02-02 DIAGNOSIS — G4710 Hypersomnia, unspecified: Secondary | ICD-10-CM | POA: Insufficient documentation

## 2023-02-02 DIAGNOSIS — Z13220 Encounter for screening for lipoid disorders: Secondary | ICD-10-CM

## 2023-02-02 DIAGNOSIS — Z23 Encounter for immunization: Secondary | ICD-10-CM

## 2023-02-02 DIAGNOSIS — B009 Herpesviral infection, unspecified: Secondary | ICD-10-CM

## 2023-02-02 DIAGNOSIS — Z125 Encounter for screening for malignant neoplasm of prostate: Secondary | ICD-10-CM | POA: Diagnosis not present

## 2023-02-02 DIAGNOSIS — Z0001 Encounter for general adult medical examination with abnormal findings: Secondary | ICD-10-CM

## 2023-02-02 DIAGNOSIS — Z1329 Encounter for screening for other suspected endocrine disorder: Secondary | ICD-10-CM

## 2023-02-02 HISTORY — DX: Hypersomnia, unspecified: G47.10

## 2023-02-02 LAB — TSH: TSH: 4.75 u[IU]/mL (ref 0.35–5.50)

## 2023-02-02 LAB — PSA: PSA: 0.86 ng/mL (ref 0.10–4.00)

## 2023-02-02 MED ORDER — VALACYCLOVIR HCL 1 G PO TABS: ORAL_TABLET | ORAL | 1 refills | Status: DC

## 2023-02-02 NOTE — Assessment & Plan Note (Signed)
Concern for sleep apnea given his description.  Home sleep study ordered.

## 2023-02-02 NOTE — Progress Notes (Signed)
Philip Alar, MD Phone: [phone]  Philip Cordova is a 57 y.o. male who presents today for CPE.  Diet: healthy Exercise: has been limited with recent wrist and elbow issues, though plans to start back Colonoscopy: 08/14/20 5 year recall, one sessile serrated polyp, one hyperplastic polyp Prostate cancer screening: due Family history-  Prostate cancer: no  Colon cancer: no Vaccines-   Flu: out of season  Tetanus: due  Shingles: UTD  COVID19: x2 HIV screening: UTD Hep C Screening: UTD Tobacco use: no Alcohol use: no Illicit Drug use: prior marijuana use, none recent Dentist: yes Ophthalmology: yes Patient has a history of major be related to cancer.  He wonders about getting the HPV vaccine. Patient reports trouble sleeping at night and waking up poorly rested.  He notes hypersomnia during the day.  Notes his whoop will advise him he gets close to 7 hours of sleep at night though he is awake for multiple hours during this time.  He wonders about getting a sleep study.   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   Hypersomnia 02/02/2023   Resolved Ambulatory Problems    Diagnosis Date Noted   Microcytic hypochromic anemia 01/08/2014   Cervical lymphadenopathy 04/27/2016   Mucositis due to antineoplastic therapy 08/25/2016   Dehydration with hyponatremia 08/25/2016   Oral bleeding 08/25/2016   Dysgeusia 08/25/2016   Insomnia due to medical condition 08/25/2016   Dehydration 09/03/2016   Acute radial nerve palsy of left  upper extremity 11/04/2016   Pneumonia 11/10/2016   Anxiety and depression 11/10/2016   Neck pain 01/14/2017   Lesion of soft palate 02/11/2017   Otitis externa 06/10/2017   BRBPR (bright red blood per rectum) 05/16/2020   Screen for colon cancer    Rectal polyp    Polyp of ascending colon    Rash 12/16/2020   Neck pain 02/23/2021   Stress 05/27/2021   Adjustment disorder with anxiety 08/07/2021   Past Medical History:  Diagnosis Date   Athlete's heart    Chickenpox    Squamous cell carcinoma of head and neck 04/09/2016    Family History  Problem Relation Age of Onset   Cancer Father        Skin   Hyperlipidemia Father    Hypertension Father        Possible   Diabetes Paternal Uncle     Social History   Socioeconomic History   Marital status: Single    Spouse name: Not on file   Number of children: Not on file   Years of education: Not on file   Highest education level: Not on file  Occupational History   Not on file  Tobacco Use   Smoking status: Former    Years: 1    Types: Cigarettes   Smokeless tobacco: Never   Tobacco comments:    "YEARS AGO"  Vaping Use   Vaping Use: Never used  Substance and Sexual Activity   Alcohol use: No    Comment: Occasionally   Drug use: No   Sexual activity: Not on file  Other Topics Concern  Not on file  Social History Narrative   1 dog    No kids   Single    Social Determinants of Health   Financial Resource Strain: Not on file  Food Insecurity: Not on file  Transportation Needs: Not on file  Physical Activity: Not on file  Stress: Not on file  Social Connections: Not on file  Intimate Partner Violence: Not on file    ROS  General:  Negative for nexplained weight loss, fever Skin: Negative for new or changing mole, sore that won't heal HEENT: Negative for trouble hearing, trouble seeing, ringing in ears, mouth sores, hoarseness, change in voice, dysphagia. CV:  Negative for chest pain, dyspnea, edema,  palpitations Resp: Negative for cough, dyspnea, hemoptysis GI: Negative for nausea, vomiting, diarrhea, constipation, abdominal pain, melena, hematochezia. GU: Negative for dysuria, incontinence, urinary hesitance, hematuria, vaginal or penile discharge, polyuria, sexual difficulty, lumps in testicle or breasts MSK: Negative for muscle cramps or aches, joint pain or swelling Neuro: Negative for headaches, weakness, numbness, dizziness, passing out/fainting Psych: Negative for depression, anxiety, memory problems  Objective  Physical Exam Vitals:   02/02/23 1148  BP: 120/82  Pulse: 84  Temp: 98 F (36.7 C)  SpO2: 94%    BP Readings from Last 3 Encounters:  02/14/23 112/70  02/02/23 120/82  01/13/23 122/76   Wt Readings from Last 3 Encounters:  02/14/23 199 lb (90.3 kg)  02/02/23 194 lb 12.8 oz (88.4 kg)  01/13/23 195 lb (88.5 kg)    Physical Exam Constitutional:      General: He is not in acute distress.    Appearance: He is not diaphoretic.  HENT:     Head: Normocephalic and atraumatic.  Cardiovascular:     Rate and Rhythm: Normal rate and regular rhythm.     Heart sounds: Normal heart sounds.  Pulmonary:     Effort: Pulmonary effort is normal.     Breath sounds: Normal breath sounds.  Abdominal:     General: Bowel sounds are normal. There is no distension.     Palpations: Abdomen is soft.     Tenderness: There is no abdominal tenderness.  Musculoskeletal:     Right lower leg: No edema.     Left lower leg: No edema.  Lymphadenopathy:     Cervical: No cervical adenopathy.  Skin:    General: Skin is warm and dry.  Neurological:     Mental Status: He is alert.  Psychiatric:        Mood and Affect: Mood normal.    Epworth Sleepiness Scale score of 6 with individual scores of 2, 1, 0, 1, 2, 0, 0, 0  Assessment/Plan:   Encounter for general adult medical examination with abnormal findings Assessment & Plan: Physical exam completed.  Encouraged healthy diet.   Discussed getting back to exercising.  PSA will be completed today.  Colon cancer screening is up-to-date.  Tetanus vaccine will be given today.  Lab work to be completed today.  Discussed HPV vaccination would not be indicated given his age.  He wondered if it would be an appropriate thing to do given that he had an HPV related cancer.  Discussed that I would send my note to a specialist to get their input.   Herpes -     valACYclovir HCl; Take 1 tablet by mouth once daily  Dispense: 90 tablet; Refill: 1  Hypersomnia Assessment & Plan: Concern for sleep apnea given his description.  Home sleep study ordered.  Orders: -  PSG SLEEP STUDY; Future  Prostate cancer screening -     PSA  Thyroid disorder screen -     TSH  Lipid screening -     Comprehensive metabolic panel -     Lipid panel  Encounter for administration of vaccine -     Td vaccine greater than or equal to 7yo preservative free IM    Return in about 1 year (around 02/02/2024) for physical.   Philip Alar, MD [facility]

## 2023-02-02 NOTE — Assessment & Plan Note (Signed)
Physical exam completed.  Encouraged healthy diet.  Discussed getting back to exercising.  PSA will be completed today.  Colon cancer screening is up-to-date.  Tetanus vaccine will be given today.  Lab work to be completed today.  Discussed HPV vaccination would not be indicated given his age.  He wondered if it would be an appropriate thing to do given that he had an HPV related cancer.  Discussed that I would send my note to a specialist to get their input.

## 2023-02-03 LAB — LIPID PANEL
Cholesterol: 176 mg/dL (ref 0–200)
HDL: 36.4 mg/dL — ABNORMAL LOW
LDL Cholesterol: 115 mg/dL — ABNORMAL HIGH (ref 0–99)
NonHDL: 139.46
Total CHOL/HDL Ratio: 5
Triglycerides: 122 mg/dL (ref 0.0–149.0)
VLDL: 24.4 mg/dL (ref 0.0–40.0)

## 2023-02-03 LAB — COMPREHENSIVE METABOLIC PANEL
ALT: 24 U/L (ref 0–53)
AST: 25 U/L (ref 0–37)
Albumin: 4.3 g/dL (ref 3.5–5.2)
Alkaline Phosphatase: 57 U/L (ref 39–117)
BUN: 25 mg/dL — ABNORMAL HIGH (ref 6–23)
CO2: 22 mEq/L (ref 19–32)
Calcium: 9.7 mg/dL (ref 8.4–10.5)
Chloride: 99 mEq/L (ref 96–112)
Creatinine, Ser: 1.08 mg/dL (ref 0.40–1.50)
GFR: 77.63 mL/min (ref >60.00)
Glucose, Bld: 78 mg/dL (ref 70–99)
Potassium: 4.5 mEq/L (ref 3.5–5.1)
Sodium: 138 mEq/L (ref 135–145)
Total Bilirubin: 0.7 mg/dL (ref 0.2–1.2)
Total Protein: 7.3 g/dL (ref 6.0–8.3)

## 2023-02-07 ENCOUNTER — Other Ambulatory Visit: Payer: Self-pay | Admitting: Urology

## 2023-02-10 DIAGNOSIS — J301 Allergic rhinitis due to pollen: Secondary | ICD-10-CM | POA: Diagnosis not present

## 2023-02-14 ENCOUNTER — Other Ambulatory Visit: Payer: Self-pay

## 2023-02-14 ENCOUNTER — Ambulatory Visit: Payer: 59 | Admitting: Family Medicine

## 2023-02-14 VITALS — BP 112/70 | HR 68 | Ht 73.0 in | Wt 199.0 lb

## 2023-02-14 DIAGNOSIS — G8929 Other chronic pain: Secondary | ICD-10-CM | POA: Diagnosis not present

## 2023-02-14 DIAGNOSIS — M25521 Pain in right elbow: Secondary | ICD-10-CM | POA: Diagnosis not present

## 2023-02-14 DIAGNOSIS — M25531 Pain in right wrist: Secondary | ICD-10-CM | POA: Diagnosis not present

## 2023-02-14 MED ORDER — LORAZEPAM 0.5 MG PO TABS
ORAL_TABLET | ORAL | 0 refills | Status: DC
Start: 2023-02-14 — End: 2023-04-19

## 2023-02-14 NOTE — Progress Notes (Signed)
   Rubin Payor, PhD, LAT, ATC acting as a scribe for Philip Graham, MD.  Philip Cordova is a 57 y.o. male who presents to [facility] at [facility] today for 62-month follow-up R wrist and R elbow pain. 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 Pt was last seen by Dr. Denyse Amass on 01/13/23 and was advised to wear a rigid wrist brace, use Voltaren gel, and cont OT once service dog is better trained.  Today, pt reports patient locates pain to his right wrist from guitar playing and has been digging this weekend. The pain radiates from the wrist to the elbow.   Dx imaging: 01/13/23 R wrist XR  Pertinent review of systems: No fevers or chills  Relevant historical information: History of squamous cell carcinoma head and neck.   Exam:  BP 112/70   Pulse 68   Ht 6\' 1"  (1.854 m)   Wt 199 lb (90.3 kg)   SpO2 94%   BMI 26.25 kg/m  General: Well Developed, well nourished, and in no acute distress.   MSK: Right wrist normal appearing. Tender palpation ulnar wrist.  Decreased wrist motion.        Assessment and Plan: 57 y.o. male with right wrist pain thought to be due to TFCC tear or tendinitis.  Not improving with conservative management including home exercise program and occupational therapy.  Plan for MRI arthrogram to further evaluate source of pain and for potential surgical planning.  Limited Ativan for MRI anxiety.   PDMP reviewed during this encounter. Orders Placed This Encounter  Procedures   MR WRIST RIGHT WO CONTRAST    MRI arthorgram. Wrist    Standing Status:   Future    Standing Expiration Date:   02/14/2024    Order Specific Question:   What is the patient's sedation requirement?    Answer:   Anti-anxiety    Order Specific Question:   Does the patient have a pacemaker or implanted devices?    Answer:   No    Order Specific Question:   Preferred imaging location?    Answer:   [facility] (table limit-550lbs)   DG ARTHRO  INJECT WRIST RIGHT    Standing Status:   Future    Standing Expiration Date:   02/14/2024    Order Specific Question:   Reason for Exam (SYMPTOM  OR DIAGNOSIS REQUIRED)    Answer:   with MRI for MRI arthrogram    Order Specific Question:   Preferred Imaging Location?    Answer:   [facility]   Meds ordered this encounter  Medications   LORazepam (ATIVAN) 0.5 MG tablet    Sig: 1-2 tabs 30 - 60 min prior to MRI. Do not drive with this medicine.    Dispense:  2 tablet    Refill:  0     Discussed warning signs or symptoms. Please see discharge instructions. Patient expresses understanding.   The above documentation has been reviewed and is accurate and complete Philip Cordova, M.D.

## 2023-02-14 NOTE — Patient Instructions (Signed)
Thank you for coming in today.   We will do the MRi arthrogram and when I get the results back I will talk to you about it.   If it is obviously surgical I may refer you directly to a surgeon.   Take the lorazepam 30-60 mins prior to MRI.  You will need a driver.

## 2023-02-15 NOTE — Addendum Note (Signed)
Addended by: Adron Bene on: 02/15/2023 12:13 PM   Modules accepted: Orders

## 2023-02-23 ENCOUNTER — Telehealth: Payer: Self-pay | Admitting: Family Medicine

## 2023-02-23 NOTE — Telephone Encounter (Signed)
Peer to Peer Reviewer did authorize the MRI arthrogram. But the case needs to be withdrawn and then re entered as MRI w/contrast.  We should get a letter saying its approved.  No action needed on our end.  Should be good for MRI scheduled 03/22/23

## 2023-03-12 ENCOUNTER — Other Ambulatory Visit: Payer: Self-pay | Admitting: Urology

## 2023-03-14 MED ORDER — TESTOSTERONE CYPIONATE 200 MG/ML IM SOLN: 200.0000 mg | INTRAMUSCULAR | 0 refills | Status: DC

## 2023-03-22 ENCOUNTER — Other Ambulatory Visit: Payer: 59

## 2023-04-07 ENCOUNTER — Ambulatory Visit: Payer: 59 | Admitting: Dermatology

## 2023-04-07 ENCOUNTER — Encounter: Payer: Self-pay | Admitting: Dermatology

## 2023-04-07 DIAGNOSIS — Z872 Personal history of diseases of the skin and subcutaneous tissue: Secondary | ICD-10-CM | POA: Diagnosis not present

## 2023-04-07 DIAGNOSIS — L578 Other skin changes due to chronic exposure to nonionizing radiation: Secondary | ICD-10-CM

## 2023-04-07 DIAGNOSIS — Z79899 Other long term (current) drug therapy: Secondary | ICD-10-CM

## 2023-04-07 DIAGNOSIS — R21 Rash and other nonspecific skin eruption: Secondary | ICD-10-CM

## 2023-04-07 DIAGNOSIS — L820 Inflamed seborrheic keratosis: Secondary | ICD-10-CM

## 2023-04-07 DIAGNOSIS — L570 Actinic keratosis: Secondary | ICD-10-CM

## 2023-04-07 DIAGNOSIS — L821 Other seborrheic keratosis: Secondary | ICD-10-CM

## 2023-04-07 DIAGNOSIS — L814 Other melanin hyperpigmentation: Secondary | ICD-10-CM | POA: Diagnosis not present

## 2023-04-07 DIAGNOSIS — W908XXA Exposure to other nonionizing radiation, initial encounter: Secondary | ICD-10-CM

## 2023-04-07 DIAGNOSIS — M310 Hypersensitivity angiitis: Secondary | ICD-10-CM

## 2023-04-07 NOTE — Patient Instructions (Signed)
Cryotherapy Aftercare  . Wash gently with soap and water everyday.   . Apply Vaseline and Band-Aid daily until healed.  

## 2023-04-07 NOTE — Progress Notes (Signed)
Follow-Up Visit   Subjective  Philip Cordova is a 57 y.o. male who presents for the following: some spots at face that are sometimes sore and feel kind of like sunburn.  The patient has spots, moles and lesions to be evaluated, some may be new or changing and the patient may have concern these could be cancer.  The following portions of the chart were reviewed this encounter and updated as appropriate: medications, allergies, medical history  Review of Systems:  No other skin or systemic complaints except as noted in HPI or Assessment and Plan.  Objective  Well appearing patient in no apparent distress; mood and affect are within normal limits.  A focused examination was performed of the following areas: Arms, face  Relevant exam findings are noted in the Assessment and Plan.  face x 23 (23) Erythematous thin papules/macules with gritty scale.   Left Forearm x 1, back x 4 (5) Erythematous stuck-on, waxy papule or plaque   Assessment & Plan   AK (actinic keratosis) (23) face x 23  Actinic keratoses are precancerous spots that appear secondary to cumulative UV radiation exposure/sun exposure over time. They are chronic with expected duration over 1 year. A portion of actinic keratoses will progress to squamous cell carcinoma of the skin. It is not possible to reliably predict which spots will progress to skin cancer and so treatment is recommended to prevent development of skin cancer.  Recommend daily broad spectrum sunscreen SPF 30+ to sun-exposed areas, reapply every 2 hours as needed.  Recommend staying in the shade or wearing long sleeves, sun glasses (UVA+UVB protection) and wide brim hats (4-inch brim around the entire circumference of the hat). Call for new or changing lesions.   Destruction of lesion - face x 23 (23)  Destruction method: cryotherapy   Informed consent: discussed and consent obtained   Lesion destroyed using liquid nitrogen: Yes   Cryotherapy cycles:   2 Outcome: patient tolerated procedure well with no complications   Post-procedure details: wound care instructions given    Inflamed seborrheic keratosis (5) Left Forearm x 1, back x 4  Symptomatic, irritating, patient would like treated.  Benign-appearing.  Call clinic for new or changing lesions.    Destruction of lesion - Left Forearm x 1, back x 4 (5)  Destruction method: cryotherapy   Informed consent: discussed and consent obtained   Lesion destroyed using liquid nitrogen: Yes   Cryotherapy cycles:  2 Outcome: patient tolerated procedure well with no complications   Post-procedure details: wound care instructions given    ACTINIC DAMAGE - chronic, secondary to cumulative UV radiation exposure/sun exposure over time - diffuse scaly erythematous macules with underlying dyspigmentation - Recommend daily broad spectrum sunscreen SPF 30+ to sun-exposed areas, reapply every 2 hours as needed.  - Recommend staying in the shade or wearing long sleeves, sun glasses (UVA+UVB protection) and wide brim hats (4-inch brim around the entire circumference of the hat). - Call for new or changing lesions.   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 Condition is improving with treatment but not currently at goal.     SEBORRHEIC KERATOSIS - Stuck-on, waxy, tan-brown papules and/or plaques  - Benign-appearing - Discussed benign etiology and prognosis. - Observe - Call for  any changes  LENTIGINES Exam: scattered tan macules Due to sun exposure Treatment Plan: Benign-appearing, observe. Recommend daily broad spectrum sunscreen SPF 30+ to sun-exposed areas, reapply every 2 hours as needed.  Call for  any changes  Return for TBSE, as scheduled.  Anise Salvo, RMA, am acting as scribe for Armida Sans, MD .  Documentation: I have reviewed the above documentation for accuracy and completeness, and I agree with the above.  Armida Sans, MD

## 2023-04-08 ENCOUNTER — Other Ambulatory Visit: Payer: Self-pay | Admitting: *Deleted

## 2023-04-08 DIAGNOSIS — R7989 Other specified abnormal findings of blood chemistry: Secondary | ICD-10-CM

## 2023-04-11 ENCOUNTER — Encounter: Payer: Self-pay | Admitting: Family Medicine

## 2023-04-11 ENCOUNTER — Other Ambulatory Visit: Payer: 59

## 2023-04-11 DIAGNOSIS — R7989 Other specified abnormal findings of blood chemistry: Secondary | ICD-10-CM | POA: Diagnosis not present

## 2023-04-13 ENCOUNTER — Ambulatory Visit: Payer: 59 | Admitting: Urology

## 2023-04-13 ENCOUNTER — Encounter: Payer: Self-pay | Admitting: Urology

## 2023-04-13 VITALS — BP 114/77 | HR 91 | Ht 73.0 in | Wt 198.0 lb

## 2023-04-13 DIAGNOSIS — D751 Secondary polycythemia: Secondary | ICD-10-CM

## 2023-04-13 DIAGNOSIS — E291 Testicular hypofunction: Secondary | ICD-10-CM

## 2023-04-13 NOTE — Progress Notes (Signed)
I,Dina M Abdulla,acting as a scribe for Philip Altes, MD.,have documented all relevant documentation on the behalf of Philip Altes, MD,as directed by  Philip Altes, MD while in the presence of Philip Altes, MD.  04/13/2023 9:49 AM   Zenovia Jordan July 01, 1966 [id number]  Referring provider: Glori Luis, MD [address] [place],  [place] [address]  Chief Complaint  Patient presents with   Hypogonadism    Urologic History:  1. Hypogonadism Testosterone cypionate 200 mg weekly. Most bothersome symtoms: tiredness, fatigue, decreased libido.   HPI: Urias Cain is a 57 y.o. male who presents for follow up of hypogonadism.  Doing well since last visit No bothersome LUTS Denies dysuria, gross hematuria Denies flank, abdominal or pelvic pain  No longer taking Anastrozole Labs 04/11/23: testosterone 548, PSA 0.8, H/H 18.3/54.1   PMH: Past Medical History:  Diagnosis Date   Asthma    Athlete's heart    STATES HR STAYS AROUND 46   Chickenpox    Herpes 12/16/2020   Squamous cell carcinoma of head and neck 04/09/2016   Surgical resection, chemo + rad tx's.     Surgical History: Past Surgical History:  Procedure Laterality Date   APPENDECTOMY  2008   COLONOSCOPY WITH PROPOFOL N/A 08/14/2020   Procedure: COLONOSCOPY WITH PROPOFOL;  Surgeon: Pasty Spillers, MD;  Location: [facility];  Service: Endoscopy;  Laterality: N/A;   DIAGNOSTIC LARYNGOSCOPY  06/17/2016   Procedure: DIAGNOSTIC LARYNGOSCOPY WITH BIOPSIES;  Surgeon: Bud Face, MD;  Location: [facility] ORS;  Service: ENT;;   Moles removed  2005   RHINOPLASTY  1990   TONSILLECTOMY N/A 06/17/2016   Procedure: TONSILLECTOMY;  Surgeon: Bud Face, MD;  Location: [facility] ORS;  Service: ENT;  Laterality: N/A;   TONSILLECTOMY      Home Medications:  Allergies as of 04/13/2023       Reactions   Cat Hair Extract Other (See Comments)   Milk-related Compounds         Medication  List        Accurate as of April 13, 2023  9:49 AM. If you have any questions, ask your nurse or doctor.          albuterol 108 (90 Base) MCG/ACT inhaler Commonly known as: Proventil HFA INHALE 1 PUFF INTO THE LUNGS EVERY 6 HOURS AS NEEDED FOR WHEEZING.   anastrozole 1 MG tablet Commonly known as: ARIMIDEX Take 1 tablet (1 mg total) by mouth once a week.   Condylox 0.5 % gel Generic drug: podofilox Spot treat affected as needed.   EPINEPHrine 0.3 mg/0.3 mL Soaj injection Commonly known as: EPI-PEN Inject into the muscle.   Fish Oil 1000 MG Caps Take 1 capsule by mouth daily.   fluticasone-salmeterol 250-50 MCG/ACT Aepb Commonly known as: Advair Diskus Inhale 1 puff into the lungs in the morning and at bedtime. Rinse mouth further refills PCP   hydroxychloroquine 200 MG tablet Commonly known as: PLAQUENIL   Iron 90 (18 Fe) MG Tabs Take by mouth.   LORazepam 0.5 MG tablet Commonly known as: ATIVAN 1-2 tabs 30 - 60 min prior to MRI. Do not drive with this medicine.   mometasone 0.1 % cream Commonly known as: ELOCON Apply 1 Application topically as directed. Qd to bid up to 5 days a week aa groin until clear   multivitamin with minerals tablet Take by mouth.   neomycin-polymyxin-hydrocortisone OTIC solution Commonly known as: CORTISPORIN Place 4 drops into both ears 3 (three)  times daily.   testosterone cypionate 200 MG/ML injection Commonly known as: DEPOTESTOSTERONE CYPIONATE Inject 1 mL (200 mg total) into the muscle once a week.   valACYclovir 1000 MG tablet Commonly known as: VALTREX Take 1 tablet by mouth once daily        Allergies:  Allergies  Allergen Reactions   Cat Hair Extract Other (See Comments)   Milk-Related Compounds     Family History: Family History  Problem Relation Age of Onset   Cancer Father        Skin   Hyperlipidemia Father    Hypertension Father        Possible   Diabetes Paternal Uncle     Social History:   reports that he has quit smoking. His smoking use included cigarettes. He has never used smokeless tobacco. He reports that he does not drink alcohol and does not use drugs.   Physical Exam: BP 114/77   Pulse 91   Ht 6\' 1"  (1.854 m)   Wt 198 lb (89.8 kg)   BMI 26.12 kg/m   Constitutional:  Alert and oriented, No acute distress. HEENT: [place] AT Respiratory: Normal respiratory effort, no increased work of breathing. Psychiatric: Normal mood and affect.   Assessment & Plan:    1.  Hypogonadism Doing well TRT. Lab visit 6 months, testosterone, H/H, PSA.  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 He is able to donate blood an we'll schedule a blood donation ASAP.  I have reviewed the above documentation for accuracy and completeness, and I agree with the above.   Philip Altes, MD  [facility] [address], [address] [place], [place] [address] [phone]

## 2023-04-18 ENCOUNTER — Other Ambulatory Visit: Payer: Self-pay | Admitting: Family Medicine

## 2023-04-18 NOTE — Telephone Encounter (Signed)
Refill not appropriate. Rx was to be taken prior to MRI, which is scheduled for 8/21.

## 2023-04-19 MED ORDER — LORAZEPAM 0.5 MG PO TABS: ORAL_TABLET | ORAL | 0 refills | Status: DC

## 2023-04-19 NOTE — Telephone Encounter (Signed)
Ativan prescribed.

## 2023-04-19 NOTE — Telephone Encounter (Signed)
Pt did not get the MRI in June and did not pick up this med. MRI is now scheduled for tomorrow, the rx has expired and pt needs for MRI tomorrow.

## 2023-04-20 ENCOUNTER — Other Ambulatory Visit: Payer: Self-pay | Admitting: Family Medicine

## 2023-04-20 ENCOUNTER — Ambulatory Visit
Admission: RE | Admit: 2023-04-20 | Discharge: 2023-04-20 | Disposition: A | Discharge status: Home / Self Care | Payer: 59 | Source: Ambulatory Visit | Attending: Family Medicine | Admitting: Family Medicine

## 2023-04-20 DIAGNOSIS — S63591A Other specified sprain of right wrist, initial encounter: Secondary | ICD-10-CM | POA: Diagnosis not present

## 2023-04-20 DIAGNOSIS — M25531 Pain in right wrist: Secondary | ICD-10-CM | POA: Diagnosis not present

## 2023-04-20 DIAGNOSIS — M67833 Other specified disorders of tendon, right wrist: Secondary | ICD-10-CM | POA: Diagnosis not present

## 2023-04-20 DIAGNOSIS — G8929 Other chronic pain: Secondary | ICD-10-CM

## 2023-04-20 DIAGNOSIS — S63511A Sprain of carpal joint of right wrist, initial encounter: Secondary | ICD-10-CM | POA: Diagnosis not present

## 2023-04-20 DIAGNOSIS — M25521 Pain in right elbow: Secondary | ICD-10-CM

## 2023-04-20 MED ORDER — IOPAMIDOL (ISOVUE-M 200) INJECTION 41%
1.0000 mL | Freq: Once | INTRAMUSCULAR | Status: AC
  Administered 2023-04-20: 1 mL via INTRA_ARTICULAR

## 2023-04-22 ENCOUNTER — Encounter: Payer: Self-pay | Admitting: Dermatology

## 2023-05-09 ENCOUNTER — Encounter: Payer: Self-pay | Admitting: Family Medicine

## 2023-05-09 DIAGNOSIS — G8929 Other chronic pain: Secondary | ICD-10-CM

## 2023-05-09 NOTE — Progress Notes (Signed)
Wrist MRI shows a tear of one of the ligaments in the wrist and a tear of the cartilage structure on the outside part of the wrist called the TFCC.  I think it would be a good idea for you to have a consultation with a hand surgeon.  Would you like me to place a referral?

## 2023-05-16 DIAGNOSIS — M3501 Sicca syndrome with keratoconjunctivitis: Secondary | ICD-10-CM | POA: Diagnosis not present

## 2023-05-16 DIAGNOSIS — H10212 Acute toxic conjunctivitis, left eye: Secondary | ICD-10-CM | POA: Diagnosis not present

## 2023-05-18 ENCOUNTER — Other Ambulatory Visit: Payer: Self-pay | Admitting: Urology

## 2023-05-20 MED ORDER — TESTOSTERONE CYPIONATE 200 MG/ML IM SOLN: 200.0000 mg | INTRAMUSCULAR | 0 refills | Status: DC

## 2023-05-30 DIAGNOSIS — J301 Allergic rhinitis due to pollen: Secondary | ICD-10-CM | POA: Diagnosis not present

## 2023-05-30 DIAGNOSIS — S63501A Unspecified sprain of right wrist, initial encounter: Secondary | ICD-10-CM | POA: Diagnosis not present

## 2023-05-30 DIAGNOSIS — R200 Anesthesia of skin: Secondary | ICD-10-CM | POA: Diagnosis not present

## 2023-06-06 DIAGNOSIS — J301 Allergic rhinitis due to pollen: Secondary | ICD-10-CM | POA: Diagnosis not present

## 2023-06-13 ENCOUNTER — Ambulatory Visit: Payer: 59 | Admitting: Dermatology

## 2023-06-13 DIAGNOSIS — L821 Other seborrheic keratosis: Secondary | ICD-10-CM

## 2023-06-13 DIAGNOSIS — L814 Other melanin hyperpigmentation: Secondary | ICD-10-CM | POA: Diagnosis not present

## 2023-06-13 DIAGNOSIS — W908XXA Exposure to other nonionizing radiation, initial encounter: Secondary | ICD-10-CM

## 2023-06-13 DIAGNOSIS — L578 Other skin changes due to chronic exposure to nonionizing radiation: Secondary | ICD-10-CM | POA: Diagnosis not present

## 2023-06-13 DIAGNOSIS — L988 Other specified disorders of the skin and subcutaneous tissue: Secondary | ICD-10-CM | POA: Diagnosis not present

## 2023-06-13 NOTE — Progress Notes (Signed)
   Follow-Up Visit   Subjective  Philip Cordova is a 57 y.o. male who presents for the following: Irregular skin lesions on the face, patient concerned and would like areas checked today.  The patient has spots, moles and lesions to be evaluated, some may be new or changing and the patient may have concern these could be cancer.   The following portions of the chart were reviewed this encounter and updated as appropriate: medications, allergies, medical history  Review of Systems:  No other skin or systemic complaints except as noted in HPI or Assessment and Plan.  Objective  Well appearing patient in no apparent distress; mood and affect are within normal limits.   A focused examination was performed of the following areas:   Relevant exam findings are noted in the Assessment and Plan.    Assessment & Plan   ACTINIC DAMAGE - chronic, secondary to cumulative UV radiation exposure/sun exposure over time - diffuse scaly erythematous macules with underlying dyspigmentation - Recommend daily broad spectrum sunscreen SPF 30+ to sun-exposed areas, reapply every 2 hours as needed.  - Recommend staying in the shade or wearing long sleeves, sun glasses (UVA+UVB protection) and wide brim hats (4-inch brim around the entire circumference of the hat). - Call for new or changing lesions.  LENTIGINES Exam: scattered tan macules Due to sun exposure Treatment Plan: Benign-appearing, observe. Recommend daily broad spectrum sunscreen SPF 30+ to sun-exposed areas, reapply every 2 hours as needed.  Call for any changes  SEBORRHEIC KERATOSIS - Stuck-on, waxy, tan-brown papules and/or plaques  - Benign-appearing - Discussed benign etiology and prognosis. - Observe - Call for any changes  FACIAL ELASTOSIS Exam: Rhytides and volume loss.  Treatment Plan: Discussed filler to the oral commissure, and Botox to the DAO's. Patient deferred treatment at this time.  Recommend daily broad spectrum  sunscreen SPF 30+ to sun-exposed areas, reapply every 2 hours as needed. Call for new or changing lesions.  Staying in the shade or wearing long sleeves, sun glasses (UVA+UVB protection) and wide brim hats (4-inch brim around the entire circumference of the hat) are also recommended for sun protection.   Lentigines (3) - cosmetic - out of pocket pay Lower lip x 2, upper lip x 1  Destruction of lesion - Lower lip x 2, upper lip x 1 (3) Complexity: simple   Destruction method: cryotherapy   Informed consent: discussed and consent obtained   Timeout:  patient name, date of birth, surgical site, and procedure verified Lesion destroyed using liquid nitrogen: Yes   Region frozen until ice ball extended beyond lesion: Yes   Outcome: patient tolerated procedure well with no complications   Post-procedure details: wound care instructions given     Return for appointment as scheduled.  Maylene Roes, CMA, am acting as scribe for Armida Sans, MD .   Documentation: I have reviewed the above documentation for accuracy and completeness, and I agree with the above.  Armida Sans, MD

## 2023-06-13 NOTE — Patient Instructions (Signed)

## 2023-06-17 DIAGNOSIS — Z79899 Other long term (current) drug therapy: Secondary | ICD-10-CM | POA: Diagnosis not present

## 2023-06-21 ENCOUNTER — Encounter: Payer: Self-pay | Admitting: Dermatology

## 2023-06-30 DIAGNOSIS — M359 Systemic involvement of connective tissue, unspecified: Secondary | ICD-10-CM | POA: Diagnosis not present

## 2023-06-30 DIAGNOSIS — Z79899 Other long term (current) drug therapy: Secondary | ICD-10-CM | POA: Diagnosis not present

## 2023-07-06 DIAGNOSIS — H6123 Impacted cerumen, bilateral: Secondary | ICD-10-CM | POA: Diagnosis not present

## 2023-07-06 DIAGNOSIS — J301 Allergic rhinitis due to pollen: Secondary | ICD-10-CM | POA: Diagnosis not present

## 2023-07-06 DIAGNOSIS — Z85818 Personal history of malignant neoplasm of other sites of lip, oral cavity, and pharynx: Secondary | ICD-10-CM | POA: Diagnosis not present

## 2023-07-06 DIAGNOSIS — H6063 Unspecified chronic otitis externa, bilateral: Secondary | ICD-10-CM | POA: Diagnosis not present

## 2023-07-21 ENCOUNTER — Ambulatory Visit (INDEPENDENT_AMBULATORY_CARE_PROVIDER_SITE_OTHER): Payer: 59 | Admitting: Dermatology

## 2023-07-21 ENCOUNTER — Other Ambulatory Visit: Payer: Self-pay | Admitting: Family Medicine

## 2023-07-21 DIAGNOSIS — Z7189 Other specified counseling: Secondary | ICD-10-CM

## 2023-07-21 DIAGNOSIS — W908XXA Exposure to other nonionizing radiation, initial encounter: Secondary | ICD-10-CM

## 2023-07-21 DIAGNOSIS — L570 Actinic keratosis: Secondary | ICD-10-CM

## 2023-07-21 DIAGNOSIS — Z85828 Personal history of other malignant neoplasm of skin: Secondary | ICD-10-CM | POA: Diagnosis not present

## 2023-07-21 DIAGNOSIS — L814 Other melanin hyperpigmentation: Secondary | ICD-10-CM | POA: Diagnosis not present

## 2023-07-21 DIAGNOSIS — D1801 Hemangioma of skin and subcutaneous tissue: Secondary | ICD-10-CM

## 2023-07-21 DIAGNOSIS — D229 Melanocytic nevi, unspecified: Secondary | ICD-10-CM

## 2023-07-21 DIAGNOSIS — Z872 Personal history of diseases of the skin and subcutaneous tissue: Secondary | ICD-10-CM | POA: Diagnosis not present

## 2023-07-21 DIAGNOSIS — Z1283 Encounter for screening for malignant neoplasm of skin: Secondary | ICD-10-CM

## 2023-07-21 DIAGNOSIS — L578 Other skin changes due to chronic exposure to nonionizing radiation: Secondary | ICD-10-CM | POA: Diagnosis not present

## 2023-07-21 DIAGNOSIS — Z79899 Other long term (current) drug therapy: Secondary | ICD-10-CM

## 2023-07-21 DIAGNOSIS — L821 Other seborrheic keratosis: Secondary | ICD-10-CM | POA: Diagnosis not present

## 2023-07-21 DIAGNOSIS — M310 Hypersensitivity angiitis: Secondary | ICD-10-CM

## 2023-07-21 DIAGNOSIS — J45909 Unspecified asthma, uncomplicated: Secondary | ICD-10-CM

## 2023-07-21 NOTE — Patient Instructions (Addendum)
Actinic keratoses are precancerous spots that appear secondary to cumulative UV radiation exposure/sun exposure over time. They are chronic with expected duration over 1 year. A portion of actinic keratoses will progress to squamous cell carcinoma of the skin. It is not possible to reliably predict which spots will progress to skin cancer and so treatment is recommended to prevent development of skin cancer.  Recommend daily broad spectrum sunscreen SPF 30+ to sun-exposed areas, reapply every 2 hours as needed.  Recommend staying in the shade or wearing long sleeves, sun glasses (UVA+UVB protection) and wide brim hats (4-inch brim around the entire circumference of the hat). Call for new or changing lesions.    Cryotherapy Aftercare  Wash gently with soap and water everyday.   Apply Vaseline and Band-Aid daily until healed.     Melanoma ABCDEs  Melanoma is the most dangerous type of skin cancer, and is the leading cause of death from skin disease.  You are more likely to develop melanoma if you: Have light-colored skin, light-colored eyes, or red or blond hair Spend a lot of time in the sun Tan regularly, either outdoors or in a tanning bed Have had blistering sunburns, especially during childhood Have a close family member who has had a melanoma Have atypical moles or large birthmarks  Early detection of melanoma is key since treatment is typically straightforward and cure rates are extremely high if we catch it early.   The first sign of melanoma is often a change in a mole or a new dark spot.  The ABCDE system is a way of remembering the signs of melanoma.  A for asymmetry:  The two halves do not match. B for border:  The edges of the growth are irregular. C for color:  A mixture of colors are present instead of an even brown color. D for diameter:  Melanomas are usually (but not always) greater than 6mm - the size of a pencil eraser. E for evolution:  The spot keeps changing in  size, shape, and color.  Please check your skin once per month between visits. You can use a small mirror in front and a large mirror behind you to keep an eye on the back side or your body.   If you see any new or changing lesions before your next follow-up, please call to schedule a visit.  Please continue daily skin protection including broad spectrum sunscreen SPF 30+ to sun-exposed areas, reapplying every 2 hours as needed when you're outdoors.   Staying in the shade or wearing long sleeves, sun glasses (UVA+UVB protection) and wide brim hats (4-inch brim around the entire circumference of the hat) are also recommended for sun protection.    Due to recent changes in healthcare laws, you may see results of your pathology and/or laboratory studies on MyChart before the doctors have had a chance to review them. We understand that in some cases there may be results that are confusing or concerning to you. Please understand that not all results are received at the same time and often the doctors may need to interpret multiple results in order to provide you with the best plan of care or course of treatment. Therefore, we ask that you please give [place] 2 business days to thoroughly review all your results before contacting the office for clarification. Should we see a critical lab result, you will be contacted sooner.   If You Need Anything After Your Visit  If you have any questions or concerns for  your doctor, please call our main line at [phone] and press option 4 to reach your doctor's medical assistant. If no one answers, please leave a voicemail as directed and we will return your call as soon as possible. Messages left after 4 pm will be answered the following business day.   You may also send [place] a message via MyChart. We typically respond to MyChart messages within 1-2 business days.  For prescription refills, please ask your pharmacy to contact our office. Our fax number is  [phone].  If you have an urgent issue when the clinic is closed that cannot wait until the next business day, you can page your doctor at the number below.    Please note that while we do our best to be available for urgent issues outside of office hours, we are not available 24/7.   If you have an urgent issue and are unable to reach [place], you may choose to seek medical care at your doctor's office, retail clinic, urgent care center, or emergency room.  If you have a medical emergency, please immediately call 911 or go to the emergency department.  Pager Numbers  - Dr. Gwen Pounds: [phone]  - Dr. Roseanne Reno: [phone]  - Dr. Katrinka Blazing: [phone]   In the event of inclement weather, please call our main line at [phone] for an update on the status of any delays or closures.  Dermatology Medication Tips: Please keep the boxes that topical medications come in in order to help keep track of the instructions about where and how to use these. Pharmacies typically print the medication instructions only on the boxes and not directly on the medication tubes.   If your medication is too expensive, please contact our office at [phone] option 4 or send [place] a message through MyChart.   We are unable to tell what your co-pay for medications will be in advance as this is different depending on your insurance coverage. However, we may be able to find a substitute medication at lower cost or fill out paperwork to get insurance to cover a needed medication.   If a prior authorization is required to get your medication covered by your insurance company, please allow [place] 1-2 business days to complete this process.  Drug prices often vary depending on where the prescription is filled and some pharmacies may offer cheaper prices.  The website www.goodrx.com contains coupons for medications through different pharmacies. The prices here do not account for what the cost may be with help from  insurance (it may be cheaper with your insurance), but the website can give you the price if you did not use any insurance.  - You can print the associated coupon and take it with your prescription to the pharmacy.  - You may also stop by our office during regular business hours and pick up a GoodRx coupon card.  - If you need your prescription sent electronically to a different pharmacy, notify our office through [facility] or by phone at [phone] option 4.     Si Usted Necesita Algo Despus de Su Visita  Tambin puede enviarnos un mensaje a travs de Clinical cytogeneticist. Por lo general respondemos a los mensajes de MyChart en el transcurso de 1 a 2 das hbiles.  Para renovar recetas, por favor pida a su farmacia que se ponga en contacto con nuestra oficina. Annie Sable de fax es [place] [phone].  Si tiene un asunto urgente cuando la clnica est cerrada y que no puede esperar Teacher, adult education  el siguiente da hbil, puede llamar/localizar a su doctor(a) al nmero que aparece a continuacin.   Por favor, tenga en cuenta que aunque hacemos todo lo posible para estar disponibles para asuntos urgentes fuera del horario de [place], no estamos disponibles las 24 horas del da, los 7 [address]  [address] de la [place].   Si tiene un problema urgente y no puede comunicarse con nosotros, puede optar por buscar atencin mdica  en el consultorio de su doctor(a), en una clnica privada, en un centro de atencin urgente o en una sala de emergencias.  Si tiene Engineer, drilling, por favor llame inmediatamente al 911 o vaya a la sala de emergencias.  Nmeros de bper  - Dr. Gwen Pounds: [phone]  - Dra. Roseanne Reno: [phone]  - Dr. Katrinka Blazing: [phone]   En caso de inclemencias del tiempo, por favor llame a Lacy Duverney principal al [phone] para una actualizacin sobre el [place] de cualquier retraso o cierre.  Consejos para la medicacin en dermatologa: Por favor, guarde las cajas en las que vienen los  medicamentos de uso tpico para ayudarle a seguir las instrucciones sobre dnde y cmo usarlos. Las farmacias generalmente imprimen las instrucciones del medicamento slo en las cajas y no directamente en los tubos del [place].   Si su medicamento es muy caro, por favor, pngase en contacto con Rolm Gala llamando al [phone] y presione la opcin 4 o envenos un mensaje a travs de Clinical cytogeneticist.   No podemos decirle cul ser su copago por los medicamentos por adelantado ya que esto es diferente dependiendo de la cobertura de su seguro. Sin embargo, es posible que podamos encontrar un medicamento sustituto a Audiological scientist un formulario para que el seguro cubra el medicamento que se considera necesario.   Si se requiere una autorizacin previa para que su compaa de seguros [place] su medicamento, por favor permtanos de 1 a 2 das hbiles para completar [address]  Los precios de los medicamentos varan con frecuencia dependiendo del Environmental consultant de dnde se surte la receta y alguna farmacias pueden ofrecer precios ms baratos.  El sitio web www.goodrx.com tiene cupones para medicamentos de Health and safety inspector. Los precios aqu no tienen en cuenta lo que podra costar con la ayuda del seguro (puede ser ms barato con su seguro), pero el sitio web puede darle el precio si no utiliz Tourist information centre manager.  - Puede imprimir el cupn correspondiente y llevarlo con su receta a la farmacia.  - Tambin puede pasar por nuestra oficina durante el horario de atencin regular y Education officer, museum una tarjeta de cupones de GoodRx.  - Si necesita que su receta se enve electrnicamente a una farmacia diferente, informe a nuestra oficina a travs de MyChart de [place] o por telfono llamando al [phone] y presione la opcin 4.

## 2023-07-21 NOTE — Progress Notes (Signed)
Follow-Up Visit   Subjective  Philip Cordova is a 57 y.o. 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  Reports some spots at left upper lid that he would like treated.    The patient presents for Total-Body Skin Exam (TBSE) for skin cancer screening and mole check. The patient has spots, moles and lesions to be evaluated, some may be new or changing and the patient may have concern these could be cancer.    The following portions of the chart were reviewed this encounter and updated as appropriate: medications, allergies, medical history  Review of Systems:  No other skin or systemic complaints except as noted in HPI or Assessment and Plan.  Objective  Well appearing patient in no apparent distress; mood and affect are within normal limits.  A full examination was performed including scalp, head, eyes, ears, nose, lips, neck, chest, axillae, abdomen, back, buttocks, bilateral upper extremities, bilateral lower extremities, hands, feet, fingers, toes, fingernails, and toenails. All findings within normal limits unless otherwise noted below.   Relevant physical exam findings are noted in the Assessment and Plan.  forehead x 8 (8) Erythematous thin papules/macules with gritty scale.     Assessment & Plan   SKIN CANCER SCREENING PERFORMED TODAY.  ACTINIC DAMAGE - Chronic condition, secondary to cumulative UV/sun exposure - diffuse scaly erythematous macules with underlying dyspigmentation - Recommend daily broad spectrum sunscreen SPF 30+ to sun-exposed areas, reapply every 2 hours as needed.  - Staying in the shade or wearing long sleeves, sun glasses (UVA+UVB protection) and wide brim hats (4-inch brim around the entire circumference of the hat) are also recommended for sun protection.  - Call for new or changing lesions.  LENTIGINES, SEBORRHEIC  KERATOSES, HEMANGIOMAS - Benign normal skin lesions - Benign-appearing - Call for any changes  MELANOCYTIC NEVI - Tan-brown and/or pink-flesh-colored symmetric macules and papules - Benign appearing on exam today - Observation - Call clinic for new or changing moles - Recommend daily use of broad spectrum spf 30+ sunscreen to sun-exposed areas.   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 Condition is improving with treatment but not currently at goal.   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  Observation.  Call clinic for new or changing lesions.  Recommend daily use of broad spectrum spf 30+ sunscreen to sun-exposed areas.   LENTIGINES Exam: scattered tan macules above left lateral upper lip above vermillion lentigo   Treatment Plan: Treated for cosmetic reasons with LN2 today.  $60 fee should lighten up but can recur.  Patient understands. Benign-appearing, observe. Recommend daily broad spectrum sunscreen SPF 30+ to sun-exposed areas, reapply every 2 hours as needed.  Call for any changes  Discussed cosmetic procedure cryotherapy, noncovered.  $60 for 1st lesion and $15 for each additional lesion if done on the same day.  Maximum charge $350.  One touch-up treatment included no charge. Discussed risks of treatment including dyspigmentation, small scar, and/or recurrence. Recommend daily broad spectrum sunscreen SPF 30+/photoprotection to treated areas once healed.  Actinic keratosis (8) forehead x 8  Actinic keratoses are precancerous spots that appear  secondary to cumulative UV radiation exposure/sun exposure over time. They are chronic with expected  duration over 1 year. A portion of actinic keratoses will progress to squamous cell carcinoma of the skin. It is not possible to reliably predict which spots will progress to skin cancer and so treatment is recommended to prevent development of skin cancer.  Recommend daily broad spectrum sunscreen SPF 30+ to sun-exposed areas, reapply every 2 hours as needed.  Recommend staying in the shade or wearing long sleeves, sun glasses (UVA+UVB protection) and wide brim hats (4-inch brim around the entire circumference of the hat). Call for new or changing lesions.  Destruction of lesion - forehead x 8 (8) Complexity: simple   Destruction method: cryotherapy   Informed consent: discussed and consent obtained   Timeout:  patient name, date of birth, surgical site, and procedure verified Lesion destroyed using liquid nitrogen: Yes   Region frozen until ice ball extended beyond lesion: Yes   Outcome: patient tolerated procedure well with no complications   Post-procedure details: wound care instructions given    Actinic skin damage  Lentigo  Melanocytic nevus, unspecified location  Leukocytoclastic vasculitis (HCC)  Counseling and coordination of care  Medication management   Return in about 1 year (around 07/20/2024) for TBSE.  IAsher Muir, CMA, am acting as scribe for Armida Sans, MD.   Documentation: I have reviewed the above documentation for accuracy and completeness, and I agree with the above.  Armida Sans, MD

## 2023-07-26 ENCOUNTER — Encounter: Payer: Self-pay | Admitting: Dermatology

## 2023-08-03 ENCOUNTER — Other Ambulatory Visit: Payer: Self-pay | Admitting: Urology

## 2023-08-05 ENCOUNTER — Other Ambulatory Visit: Payer: Self-pay | Admitting: Family Medicine

## 2023-08-29 ENCOUNTER — Other Ambulatory Visit: Payer: Self-pay | Admitting: Family Medicine

## 2023-08-29 DIAGNOSIS — J45909 Unspecified asthma, uncomplicated: Secondary | ICD-10-CM

## 2023-09-01 DIAGNOSIS — J301 Allergic rhinitis due to pollen: Secondary | ICD-10-CM | POA: Diagnosis not present

## 2023-09-05 DIAGNOSIS — J301 Allergic rhinitis due to pollen: Secondary | ICD-10-CM | POA: Diagnosis not present

## 2023-09-29 ENCOUNTER — Other Ambulatory Visit: Payer: Self-pay | Admitting: Urology

## 2023-09-30 ENCOUNTER — Emergency Department: Payer: Medicaid Other

## 2023-09-30 ENCOUNTER — Other Ambulatory Visit: Payer: Self-pay

## 2023-09-30 ENCOUNTER — Encounter: Payer: Self-pay | Admitting: Intensive Care

## 2023-09-30 ENCOUNTER — Emergency Department
Admission: EM | Admit: 2023-09-30 | Discharge: 2023-09-30 | Disposition: A | Discharge status: Home / Self Care | Payer: Medicaid Other | Attending: Emergency Medicine | Admitting: Emergency Medicine

## 2023-09-30 ENCOUNTER — Ambulatory Visit: Payer: Self-pay | Admitting: Family Medicine

## 2023-09-30 DIAGNOSIS — M542 Cervicalgia: Secondary | ICD-10-CM | POA: Diagnosis not present

## 2023-09-30 DIAGNOSIS — R9431 Abnormal electrocardiogram [ECG] [EKG]: Secondary | ICD-10-CM | POA: Diagnosis not present

## 2023-09-30 LAB — CBC WITH DIFFERENTIAL/PLATELET
Abs Immature Granulocytes: 0.09 10*3/uL — ABNORMAL HIGH (ref 0.00–0.07)
Basophils Absolute: 0 10*3/uL (ref 0.0–0.1)
Basophils Relative: 0 %
Eosinophils Absolute: 0.1 10*3/uL (ref 0.0–0.5)
Eosinophils Relative: 1 %
HCT: 53.5 % — ABNORMAL HIGH (ref 39.0–52.0)
Hemoglobin: 18 g/dL — ABNORMAL HIGH (ref 13.0–17.0)
Immature Granulocytes: 1 %
Lymphocytes Relative: 6 %
Lymphs Abs: 1 10*3/uL (ref 0.7–4.0)
MCH: 32.9 pg (ref 26.0–34.0)
MCHC: 33.6 g/dL (ref 30.0–36.0)
MCV: 97.8 fL (ref 80.0–100.0)
Monocytes Absolute: 1.4 10*3/uL — ABNORMAL HIGH (ref 0.1–1.0)
Monocytes Relative: 9 %
Neutro Abs: 12.6 10*3/uL — ABNORMAL HIGH (ref 1.7–7.7)
Neutrophils Relative %: 83 %
Platelets: 275 10*3/uL (ref 150–400)
RBC: 5.47 MIL/uL (ref 4.22–5.81)
RDW: 12.6 % (ref 11.5–15.5)
WBC: 15.2 10*3/uL — ABNORMAL HIGH (ref 4.0–10.5)
nRBC: 0 % (ref 0.0–0.2)

## 2023-09-30 LAB — COMPREHENSIVE METABOLIC PANEL
ALT: 26 U/L (ref 0–44)
AST: 30 U/L (ref 15–41)
Albumin: 4.2 g/dL (ref 3.5–5.0)
Alkaline Phosphatase: 55 U/L (ref 38–126)
Anion gap: 9 (ref 5–15)
BUN: 30 mg/dL — ABNORMAL HIGH (ref 6–20)
CO2: 30 mmol/L (ref 22–32)
Calcium: 9.2 mg/dL (ref 8.9–10.3)
Chloride: 98 mmol/L (ref 98–111)
Creatinine, Ser: 0.91 mg/dL (ref 0.61–1.24)
GFR, Estimated: >60 mL/min (ref >60)
Glucose, Bld: 97 mg/dL (ref 70–99)
Potassium: 4.5 mmol/L (ref 3.5–5.1)
Sodium: 137 mmol/L (ref 135–145)
Total Bilirubin: 1.2 mg/dL (ref 0.0–1.2)
Total Protein: 7.7 g/dL (ref 6.5–8.1)

## 2023-09-30 MED ORDER — IOHEXOL 300 MG/ML  SOLN
75.0000 mL | Freq: Once | INTRAMUSCULAR | Status: AC | PRN
  Administered 2023-09-30: 75 mL via INTRAVENOUS

## 2023-09-30 MED ORDER — PREDNISONE 10 MG (21) PO TBPK: ORAL_TABLET | ORAL | 0 refills | Status: DC

## 2023-09-30 MED ORDER — CYCLOBENZAPRINE HCL 10 MG PO TABS: 10.0000 mg | ORAL_TABLET | Freq: Three times a day (TID) | ORAL | 0 refills | Status: DC | PRN

## 2023-09-30 NOTE — Telephone Encounter (Signed)
Called pt and he stated that his friend just walked in to watch the dog while he is at the ER. He is heading to the ER in about 30 min.

## 2023-09-30 NOTE — Telephone Encounter (Signed)
1st attempt to call patient. No answer. Left voicemail.   Copied from CRM [id number]. Topic: Clinical - Pink Word Triage >> Sep 30, 2023  9:24 AM [name] wrote: Reason for Triage: Pt stated that he is not able to move his neck and feels like muscle spasms and he not able to move his neck at all or lay back. Pt stated that he has been this way for 3 days now.

## 2023-09-30 NOTE — ED Triage Notes (Addendum)
Patient reports neck stiffness X2-3 days.   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   Denies fever or any other symptoms

## 2023-09-30 NOTE — Telephone Encounter (Signed)
3rd attempt to contact pt to triage symptoms Chief Complaint: severe neck pain, panic Symptoms: severe neck pain, restricted mobility, little bit trouble swallowing, dry mouth, little trouble breathing, panic attacks Frequency: continual, intermittent worsening Pertinent Negatives: Patient denies chest pain, further numbness/weakness Disposition: [] 911 / [] ED /[] Urgent Care (no appt availability in office) / [] Appointment(In office/virtual)/ []  [facility]/ [] Home Care/ [x] Refused Recommended Disposition /[] [facility]/ []  Follow-up with PCP Additional Notes: Pt reporting that 3 days ago he started having pain to neck, hard time turning neck, "spasm once in a while," pain is "up high towards very top vertebrae, feels like it's cracked, feels like trapezius-related too." Pt reporting ice and heat do not help, OTC meds not helping pain either. Pt groaning/screaming in pain suddenly at times throughout call. Pt reporting hx of cancer in neck area "years ago with some nerve damage in arm and was only able to sleep in lazy boy for almost a year, when it was rebuilding feels like ice on inside of your veins," and "doesn't feel too different from that." Pt confirms no acute injury, no further weakness/numbness from his usual. Pt reporting more "panic attacks for past few days. Pt reporting no difficulty breathing but "little bit of asthma, little trouble breathing this morning, been panicking a little" and "might have missed inhaler last night." Pt reporting "a little bit trouble swallowing, but mouth really dry, just tried to eat something, think there may be swelling in there." Advised pt go to ED to ensure examination and scans if needed, pt hesitant to go to hospital, "don't know how long I'd be able to leave my dog alone," he is a service dog but "can't have my dog mopping up hospital floor," but "did that cancer in a room alone with a dog." Pt reporting that he "can't do germs, can't  touch anything, don't touch people or children, haven't touched someone in decades probably," but "will go if need to." Advised that nurse will connect to [facility] to see if doc could see him today in knowing him and his hx, maybe order any scans needed. Pt verbalized understanding. Attempted warm transfer to [facility], explained situation, asked if could talk to CMA or nurse to pull doc aside and ask if appt or ED best for pt's hx and situation. [facility] front desk refused to speak with pt and stated that pt needs to schedule appt and that nurse needs to transfer to [facility] scheduling. Informed pt that HP message would be sent to PCP office for review and that he should receive a call in 30 min or so, advised pt call back if no call. Pt reporting he took naproxen and no relief, reporting that he's asking friend if he can watch pt's dog. Advised pt be examined asap, go to ED or call 911 if worsening. Pt verbalized understanding.  Reason for Disposition  Difficulty breathing or unusual sweating (e.g., sweating without exertion)  Answer Assessment - Initial Assessment Questions 1. ONSET: "When did the pain begin?"      3 days ago 2. LOCATION: "Where does it hurt?"      Started on lefthand side of neck, couldn't turn head to left for a while like a pinch, can make neck where it's no pain if perfectly straight then will spasm once in a while, up high towards very top vertebrae, but feels like it's cracked, feels like trapezius related too 3. PATTERN "Does the pain come and go, or has it been constant since  it started?"      Can press on it and can move neck a little bit but if pressure off hurts even worse, brings in cortisone from touch, tried ice pack and made it worse, heat not making better either 4. SEVERITY: "How bad is the pain?"  (Scale 1-10; or mild, moderate, severe)   - NO PAIN (0): no pain or only slight stiffness    - MILD (1-3): doesn't interfere with normal activities    - MODERATE (4-7): interferes with  normal activities or awakens from sleep    - SEVERE (8-10):  excruciating pain, unable to do any normal activities      Groaning/screaming in pain at times on phone, when hurts 8/10 knee-dropping have to cry and I am fucking tough as nails, did that cancer in a room alone with a dog, more worried about sleeping, like someone crushing stick inside of joint 5. RADIATION: "Does the pain go anywhere else, shoot into your arms?"     Denies, well located in neck and trapezius, but nerve damage in right arm and paralysis in left arm 6. CORD SYMPTOMS: "Any weakness or numbness of the arms or legs?"     Already had numbness in hand but don't feel further 7. CAUSE: "What do you think is causing the neck pain?"     Thinking overuse 8. 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 OTHER SYMPTOMS: "Do you have any other symptoms?" (e.g., headache, fever, chest pain, difficulty breathing, neck swelling)     Denies, little bit of asthma, little trouble breathing this morning, been panicking a little, had cancer don't want to go through that again, neck not getting too much worse but not improving at all, a little bit trouble swallowing, but mouth really dry, just tried to eat something, think there may be swelling in there  Protocols used: Neck Pain or Stiffness-A-AH

## 2023-09-30 NOTE — ED Provider Triage Note (Signed)
Emergency Medicine Provider Triage Evaluation Note  Philip Cordova , a 58 y.o. male  was evaluated in triage.  Pt complains of has ear infection, having neck pain with movement, feels stiff, doesn't think its related to his cancer and is muscular.  Review of Systems  Positive:  Negative:   Physical Exam  BP (!) 140/103 (BP Location: Left Arm)   Pulse 86   Temp 97.8 F (36.6 C) (Oral)   Resp 16   Ht 6\' 1"  (1.854 m)   Wt 90.7 kg   SpO2 95%   BMI 26.39 kg/m  Gen:   Awake, no distress   Resp:  Normal effort  MSK:   Moves extremities without difficulty  Other:    Medical Decision Making  Medically screening exam initiated at 4:41 PM.  Appropriate orders placed.  Philip Cordova was informed that the remainder of the evaluation will be completed by another provider, this initial triage assessment does not replace that evaluation, and the importance of remaining in the ED until their evaluation is complete.     Faythe Ghee, PA-C 09/30/23 1642

## 2023-09-30 NOTE — Telephone Encounter (Signed)
2nd attempt to contact pt, no answer, LVM for call back to office to further assess symptoms. Placed in call back.

## 2023-09-30 NOTE — ED Provider Notes (Signed)
[facility] Provider Note    Event Date/Time   First MD Initiated Contact with Patient 09/30/23 1731     (approximate)   History   Neck Pain   HPI  Philip Cordova is a 58 y.o. male with history of squamous cell carcinoma of head and neck presents emergency department with severe neck pain.  States more on the left, hurts with movement, will get sharp pains occasionally.  No known injury.  He is concerned as his cancer has been in remission for a while      Physical Exam   Triage Vital Signs: ED Triage Vitals  Encounter Vitals Group     BP 09/30/23 1320 (!) 140/103     Systolic BP Percentile --      Diastolic BP Percentile --      Pulse Rate 09/30/23 1320 86     Resp 09/30/23 1320 16     Temp 09/30/23 1320 97.8 F (36.6 C)     Temp Source 09/30/23 1320 Oral     SpO2 09/30/23 1320 95 %     Weight 09/30/23 1316 200 lb (90.7 kg)     Height 09/30/23 1316 6\' 1"  (1.854 m)     Head Circumference --      Peak Flow --      Pain Score 09/30/23 1316 2     Pain Loc --      Pain Education --      Exclude from Growth Chart --     Most recent vital signs: Vitals:   09/30/23 1320 09/30/23 2018  BP: (!) 140/103 (!) 156/104  Pulse: 86 83  Resp: 16 16  Temp: 97.8 F (36.6 C) 98.1 F (36.7 C)  SpO2: 95% 98%     General: Awake, no distress.   CV:  Good peripheral perfusion. regular rate and  rhythm Resp:  Normal effort. Lungs cta Abd:  No distention.   Other:  Neck tender to palpation   ED Results / Procedures / Treatments   Labs (all labs ordered are listed, but only abnormal results are displayed) Labs Reviewed  CBC WITH DIFFERENTIAL/PLATELET - Abnormal; Notable for the following components:      Result Value   WBC 15.2 (*)    Hemoglobin 18.0 (*)    HCT 53.5 (*)    Neutro Abs 12.6 (*)    Monocytes Absolute 1.4 (*)    Abs Immature Granulocytes 0.09 (*)    All other components within normal limits  COMPREHENSIVE METABOLIC PANEL -  Abnormal; Notable for the following components:   BUN 30 (*)    All other components within normal limits     EKG     RADIOLOGY CT soft tissue of the neck, CT C-spine    PROCEDURES:   Procedures Chief Complaint  Patient presents with   Neck Pain      MEDICATIONS ORDERED IN ED: Medications  iohexol (OMNIPAQUE) 300 MG/ML solution 75 mL (75 mLs Intravenous Contrast Given 09/30/23 1825)     IMPRESSION / MDM / ASSESSMENT AND PLAN / ED COURSE  I reviewed the triage vital signs and the nursing notes.                              Differential diagnosis includes, but is not limited to, cervical radiculopathy, fracture, metastatic disease  Patient's presentation is most consistent with acute illness / injury with system symptoms.    White  count elevated at 15.2, comprehensive metabolic panel  CTs ordered  CT soft tissue of the neck along with no charge C-spine was independently reviewed interpreted by me as being negative for any acute abnormality  I did explain these findings to the patient.  He is very relieved and says all great is just a muscle strain.  Feel that this would be correct.  He was given a prescription for Sterapred and Flexeril.  He is to apply ice to the area.  Return emergency department if worsening.  Follow-up with orthopedics or his regular doctor if not improving 1 week.  Patient is in agreement treatment plan.  He is discharged stable condition.     FINAL CLINICAL IMPRESSION(S) / ED DIAGNOSES   Final diagnoses:  Neck pain     Rx / DC Orders   ED Discharge Orders          Ordered    predniSONE (STERAPRED UNI-PAK 21 TAB) 10 MG (21) TBPK tablet        09/30/23 1959    cyclobenzaprine (FLEXERIL) 10 MG tablet  3 times daily PRN        09/30/23 1959             Note:  This document was prepared using Dragon voice recognition software and may include unintentional dictation errors.    Faythe Ghee, PA-C 09/30/23 Weyman Croon     Corena Herter, MD 09/30/23 [phone]

## 2023-10-02 ENCOUNTER — Other Ambulatory Visit: Payer: Self-pay | Admitting: Family Medicine

## 2023-10-04 ENCOUNTER — Telehealth: Payer: Self-pay

## 2023-10-04 ENCOUNTER — Other Ambulatory Visit: Payer: Self-pay | Admitting: Family Medicine

## 2023-10-04 NOTE — Telephone Encounter (Signed)
 Copied from CRM [id number]. Topic: Clinical - Prescription Issue >> Oct 04, 2023  1:38 PM Isabell A wrote: Reason for CRM: Patient would like a prescription sent to the following pharmacies - he had an asthma attack the other day and was not able to get it at his local pharmacies.    [id number] [place], [place] - [address] AT [facility] OF [address]  [address] [place], [place] [address]    [id number] [address] [place], [place], [place] [address] Phone: [phone]

## 2023-10-04 NOTE — Telephone Encounter (Signed)
 Spoke to pt. Informed pt that inhaler was refilled. Pt wanted [facility] informed pt that he could call pharmacy and ask them to transfer. Pt verbalized understanding and stated that if he came across any issues he would notify us .

## 2023-10-06 ENCOUNTER — Other Ambulatory Visit: Payer: Self-pay | Admitting: *Deleted

## 2023-10-06 DIAGNOSIS — E291 Testicular hypofunction: Secondary | ICD-10-CM

## 2023-10-06 DIAGNOSIS — Z125 Encounter for screening for malignant neoplasm of prostate: Secondary | ICD-10-CM

## 2023-10-07 ENCOUNTER — Ambulatory Visit: Payer: Medicaid Other | Admitting: Family Medicine

## 2023-10-07 VITALS — BP 128/70 | HR 94 | Temp 98.4°F | Ht 73.0 in | Wt 194.6 lb

## 2023-10-07 DIAGNOSIS — J4530 Mild persistent asthma, uncomplicated: Secondary | ICD-10-CM

## 2023-10-07 DIAGNOSIS — M542 Cervicalgia: Secondary | ICD-10-CM | POA: Diagnosis not present

## 2023-10-07 DIAGNOSIS — B370 Candidal stomatitis: Secondary | ICD-10-CM

## 2023-10-07 HISTORY — DX: Candidal stomatitis: B37.0

## 2023-10-07 MED ORDER — NYSTATIN 100000 UNIT/ML MT SUSP: 5.0000 mL | Freq: Four times a day (QID) | OROMUCOSAL | 0 refills | Status: DC

## 2023-10-07 MED ORDER — EPINEPHRINE 0.3 MG/0.3ML IJ SOAJ: INTRAMUSCULAR | 0 refills | Status: AC

## 2023-10-07 NOTE — Progress Notes (Signed)
 Camellia Her, MD Phone: [phone]  Philip Cordova is a 58 y.o. male who presents today for same-day visit.  Thrush: Patient reports he feels as though he has thrush.  He is using his inhaler last week and was placed on prednisone  for his neck.  He notes his breathing and his neck have improved though he developed white material on his tongue and mouth.  No recent antibiotics.  Social History   Tobacco Use  Smoking Status Former   Types: Cigarettes  Smokeless Tobacco Never  Tobacco Comments   YEARS AGO    Current Outpatient Medications on File Prior to Visit  Medication Sig Dispense Refill   albuterol  (VENTOLIN  HFA) 108 (90 Base) MCG/ACT inhaler INHALE 1 PUFF BY MOUTH EVERY 6 HOURS AS NEEDED FOR WHEEZING 7 each 0   EPINEPHrine  0.3 mg/0.3 mL IJ SOAJ injection Inject into the muscle. 1 each 0   fluticasone -salmeterol (ADVAIR) 250-50 MCG/ACT AEPB INHALE 1 DOSE BY MOUTH TWICE DAILY (IN  THE  MORNING  AND  EVENING)  RINSE  MOUTH  AFTER  USE  (FOR  FURTHER  REFILLS  CONTACT  PCP) 60 each 0   hydroxychloroquine (PLAQUENIL) 200 MG tablet      mometasone  (ELOCON ) 0.1 % cream Apply 1 Application topically as directed. Qd to bid up to 5 days a week aa groin until clear 50 g 1   Multiple Vitamins-Minerals (MULTIVITAMIN WITH MINERALS) tablet Take by mouth.     neomycin -polymyxin-hydrocortisone  (CORTISPORIN) OTIC solution Place 4 drops into both ears 3 (three) times daily. 10 mL 0   podofilox  (CONDYLOX ) 0.5 % gel Spot treat affected as needed. 3.5 g 2   testosterone  cypionate (DEPOTESTOSTERONE CYPIONATE) 200 MG/ML injection INJECT 1ML INTO THE MUSCLE ONCE WEEKLY 10 mL 0   valACYclovir  (VALTREX ) 1000 MG tablet Take 1 tablet by mouth once daily 90 tablet 1   No current facility-administered medications on file prior to visit.     ROS see history of present illness  Objective  Physical Exam Vitals:   10/07/23 1054  BP: 128/70  Pulse: 94  Temp: 98.4 F (36.9 C)  SpO2: 98%    BP  Readings from Last 3 Encounters:  10/07/23 128/70  09/30/23 (!) 156/104  04/13/23 114/77   Wt Readings from Last 3 Encounters:  10/07/23 194 lb 9.6 oz (88.3 kg)  09/30/23 200 lb (90.7 kg)  04/13/23 198 lb (89.8 kg)    Physical Exam HENT:     Mouth/Throat:     Comments: White coating on the posterior aspect of his tongue     Assessment/Plan: Please see individual problem list.  Thrush Assessment & Plan: Exam is consistent with thrush.  Will treat with nystatin  as prescribed.  If not improving he will let us  know.  Orders: -     Nystatin ; Take 5 mLs (500,000 Units total) by mouth 4 (four) times daily. Swish in the mouth and retain for as long as possible (several minutes) before swallowing. Duration is for 7 to 14 days  Dispense: 473 mL; Refill: 0  Neck pain Assessment & Plan: Resolved with oral steroids.  Monitor for recurrence.   Mild persistent asthma without complication Assessment & Plan: Chronic issue.  Sounds to have had an exacerbation last week.  This has improved at this time.  He can continue Advair and albuterol .  Celestino may have been contributed to by his inhalers.      No follow-ups on file.   Camellia Her, MD [facility]

## 2023-10-07 NOTE — Assessment & Plan Note (Signed)
 Exam is consistent with thrush.  Will treat with nystatin  as prescribed.  If not improving he will let us  know.

## 2023-10-07 NOTE — Assessment & Plan Note (Signed)
 Resolved with oral steroids.  Monitor for recurrence.

## 2023-10-07 NOTE — Assessment & Plan Note (Addendum)
 Chronic issue.  Sounds to have had an exacerbation last week.  This has improved at this time.  He can continue Advair and albuterol .  Olla Bevels may have been contributed to by his inhalers.

## 2023-10-17 ENCOUNTER — Other Ambulatory Visit: Payer: Medicaid Other

## 2023-10-17 DIAGNOSIS — E291 Testicular hypofunction: Secondary | ICD-10-CM

## 2023-10-18 LAB — HEMOGLOBIN AND HEMATOCRIT, BLOOD
Hematocrit: 54.4 % — ABNORMAL HIGH (ref 37.5–51.0)
Hemoglobin: 17.9 g/dL — ABNORMAL HIGH (ref 13.0–17.7)

## 2023-10-18 LAB — TESTOSTERONE: Testosterone: 916 ng/dL (ref 264–916)

## 2023-10-18 LAB — PSA: Prostate Specific Ag, Serum: 0.9 ng/mL (ref 0.0–4.0)

## 2023-10-19 ENCOUNTER — Encounter: Payer: Self-pay | Admitting: Nurse Practitioner

## 2023-10-19 ENCOUNTER — Ambulatory Visit: Payer: Self-pay | Admitting: Family Medicine

## 2023-10-19 ENCOUNTER — Telehealth (INDEPENDENT_AMBULATORY_CARE_PROVIDER_SITE_OTHER): Payer: Medicaid Other | Admitting: Nurse Practitioner

## 2023-10-19 VITALS — Ht 73.0 in | Wt 194.6 lb

## 2023-10-19 DIAGNOSIS — B370 Candidal stomatitis: Secondary | ICD-10-CM | POA: Diagnosis not present

## 2023-10-19 MED ORDER — CLOTRIMAZOLE 10 MG MT TROC
10.0000 mg | Freq: Every day | OROMUCOSAL | 0 refills | Status: DC
Start: 2023-10-19 — End: 2024-06-25

## 2023-10-19 NOTE — Telephone Encounter (Signed)
 Scheduled for 11am

## 2023-10-19 NOTE — Assessment & Plan Note (Addendum)
Despite strict adherence to a Candida diet and rigorous use of Nystatin rinse, only 10-15% improvement is noted after 14 days. He prefers to try a different medication. Discontinue Nystatin rinse and prescribe Clotrimazole lozenges, 1 lozenge dissolved in the mouth 5 times daily for 14 days. He is adhering to a strict diet for Candida and overall health, including low sugar fruits, high fiber, and avoidance of dairy, wheat, and high sugar fruits. Continue the current dietary regimen. Advised to contact if persisting.

## 2023-10-19 NOTE — Progress Notes (Signed)
MyChart Video Visit    Virtual Visit via Video Note   This visit type was conducted because this format is felt to be most appropriate for this patient at this time. Physical exam was limited by quality of the video and audio technology used for the visit. CMA was able to get the patient set up on a video visit.  Patient location: Home. Patient and provider in visit Provider location: Home.  I discussed the limitations of evaluation and management by telemedicine and the availability of in person appointments. The patient expressed understanding and agreed to proceed.  Visit Date: 10/19/2023  Today's healthcare provider: Bethanie Dicker, NP     Subjective:    Patient ID: Philip Cordova, male    DOB: 1965/12/26, 58 y.o.   MRN: [id number]  Chief Complaint  Patient presents with   Acute Visit    Oral thrush, white film on back of tongue    HPI  Discussed the use of AI scribe software for clinical note transcription with the patient, who gave verbal consent to proceed.  History of Present Illness   Philip Cordova is a 58 year old male who presents with persistent oral thrush.   He has persistent oral thrush primarily affecting the posterior part of his tongue, with no involvement of the inside of his mouth. He initially suspected thrush, which was confirmed by a previous visit with Dr. Birdie Sons on 10/07/2023. He was prescribed nystatin to rinse in his mouth four times a day and adhered strictly to this regimen, along with following a Candida diet. Despite these efforts, he reports only a 10-15% improvement in symptoms after 14 days of treatment.  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 He is highly focused on diet and exercise, having lost weight due to dietary restrictions from the Candida diet. He avoids sugar, dairy, caffeine, alcohol, and high-sugar fruits, focusing on low-sugar fruits and  high fiber intake.  He has a history of asthma, which was exacerbated by cold weather, leading to a recent asthma attack. He uses albuterol as needed for asthma symptoms.  He maintains a rigorous exercise routine, walking his dog seven miles daily, and has a background in personal training and radiography. He is highly disciplined in his dietary habits, avoiding processed foods and focusing on whole fruits and vegetables.      Past Medical History:  Diagnosis Date   Asthma    Athlete's heart    STATES HR STAYS AROUND 46   Chickenpox    Herpes 12/16/2020   Squamous cell carcinoma of head and neck 04/09/2016   Surgical resection, chemo + rad tx's.     Past Surgical History:  Procedure Laterality Date   APPENDECTOMY  2008   COLONOSCOPY WITH PROPOFOL N/A 08/14/2020   Procedure: COLONOSCOPY WITH PROPOFOL;  Surgeon: Pasty Spillers, MD;  Location: [facility] END[facility];  Service: Endoscopy;  Laterality: N/A;   DIAGNOSTIC LARYNGOSCOPY  06/17/2016   Procedure: DIAGNOSTIC LARYNGOSCOPY WITH BIOPSIES;  Surgeon: Bud Face, MD;  Location: [facility] ORS;  Service: ENT;;   Moles removed  2005   RHINOPLASTY  1990   TONSILLECTOMY N/A 06/17/2016   Procedure: TONSILLECTOMY;  Surgeon: Bud Face, MD;  Location: [facility] ORS;  Service: ENT;  Laterality: N/A;   TONSILLECTOMY      Family History  Problem Relation Age of Onset   Cancer Father        Skin  Hyperlipidemia Father    Hypertension Father        Possible   Diabetes Paternal Uncle     Social History   Socioeconomic History   Marital status: Single    Spouse name: Not on file   Number of children: Not on file   Years of education: Not on file   Highest education level: Not on file  Occupational History   Not on file  Tobacco Use   Smoking status: Former    Types: Cigarettes   Smokeless tobacco: Never   Tobacco comments:    "YEARS AGO"  Vaping Use   Vaping status: Never Used  Substance and Sexual Activity   Alcohol use:  No    Comment: Occasionally   Drug use: No   Sexual activity: Not on file  Other Topics Concern   Not on file  Social History Narrative   1 dog    No kids   Single    Social Drivers of Corporate investment banker Strain: Not on file  Food Insecurity: Not on file  Transportation Needs: Not on file  Physical Activity: Not on file  Stress: Not on file  Social Connections: Not on file  Intimate Partner Violence: Not on file    Outpatient Medications Prior to Visit  Medication Sig Dispense Refill   chlorhexidine (PERIDEX) 0.12 % solution RINSE WITH ONE-HALF OUNCE AND SPIT TWICE DAILY     EPINEPHrine 0.3 mg/0.3 mL IJ SOAJ injection Inject into the muscle. 1 each 0   fluticasone-salmeterol (ADVAIR) 250-50 MCG/ACT AEPB INHALE 1 DOSE BY MOUTH TWICE DAILY (IN  THE  MORNING  AND  EVENING)  RINSE  MOUTH  AFTER  USE  (FOR  FURTHER  REFILLS  CONTACT  PCP) 60 each 0   Multiple Vitamins-Minerals (MULTIVITAMIN WITH MINERALS) tablet Take by mouth.     podofilox (CONDYLOX) 0.5 % gel Spot treat affected as needed. 3.5 g 2   testosterone cypionate (DEPOTESTOSTERONE CYPIONATE) 200 MG/ML injection INJECT INTO THE MUSCLE ONCE WEEKLY 10 mL 0   valACYclovir (VALTREX) 1000 MG tablet Take 1 tablet by mouth once daily 90 tablet 1   nystatin (MYCOSTATIN) 100000 UNIT/ML suspension Take 5 mLs (500,000 Units total) by mouth 4 (four) times daily. Swish in the mouth and retain for as long as possible (several minutes) before swallowing. Duration is for 7 to 14 days 473 mL 0   albuterol (VENTOLIN HFA) 108 (90 Base) MCG/ACT inhaler INHALE 1 PUFF BY MOUTH EVERY 6 HOURS AS NEEDED FOR WHEEZING (Patient not taking: Reported on 10/19/2023) 7 each 0   hydroxychloroquine (PLAQUENIL) 200 MG tablet  (Patient not taking: Reported on 10/19/2023)     mometasone (ELOCON) 0.1 % cream Apply 1 Application topically as directed. Qd to bid up to 5 days a week aa groin until clear (Patient not taking: Reported on 10/19/2023) 50 g 1    neomycin-polymyxin-hydrocortisone (CORTISPORIN) OTIC solution Place 4 drops into both ears 3 (three) times daily. (Patient not taking: Reported on 10/19/2023) 10 mL 0   No facility-administered medications prior to visit.    Allergies  Allergen Reactions   Cat Dander Other (See Comments)   Milk-Related Compounds     ROS See HPI    Objective:    Physical Exam  Ht 6\' 1"  (1.854 m)   Wt 194 lb 9.6 oz (88.3 kg)   BMI 25.67 kg/m  Wt Readings from Last 3 Encounters:  10/19/23 194 lb 9.6 oz (88.3 kg)  10/07/23  194 lb 9.6 oz (88.3 kg)  09/30/23 200 lb (90.7 kg)   GENERAL: alert, oriented, appears well and in no acute distress   HEENT: atraumatic, conjunttiva clear, no obvious abnormalities on inspection of external nose and ears   NECK: normal movements of the head and neck   LUNGS: on inspection no signs of respiratory distress, breathing rate appears normal, no obvious gross SOB, gasping or wheezing   CV: no obvious cyanosis   MS: moves all visible extremities without noticeable abnormality   PSYCH/NEURO: pleasant and cooperative, no obvious depression or anxiety, speech and thought processing grossly intact     Assessment & Plan:   Problem List Items Addressed This Visit       Digestive   Thrush - Primary   Despite strict adherence to a Candida diet and rigorous use of Nystatin rinse, only 10-15% improvement is noted after 14 days. He prefers to try a different medication. Discontinue Nystatin rinse and prescribe Clotrimazole lozenges, 1 lozenge dissolved in the mouth 5 times daily for 14 days. He is adhering to a strict diet for Candida and overall health, including low sugar fruits, high fiber, and avoidance of dairy, wheat, and high sugar fruits. Continue the current dietary regimen. Advised to contact if persisting.      Relevant Medications   clotrimazole (MYCELEX) 10 MG troche    I have discontinued Johnathyn Gildersleeve's nystatin. I am also having him start on  clotrimazole. Additionally, I am having him maintain his multivitamin with minerals, neomycin-polymyxin-hydrocortisone, hydroxychloroquine, Condylox, mometasone, valACYclovir, fluticasone-salmeterol, testosterone cypionate, albuterol, EPINEPHrine, and chlorhexidine.  Meds ordered this encounter  Medications   clotrimazole (MYCELEX) 10 MG troche    Sig: Take 1 tablet (10 mg total) by mouth 5 (five) times daily. X 7- 14 days. Slowly dissolve in mouth. Do not chew or swallow whole.    Dispense:  70 tablet    Refill:  0    Supervising Provider:   Birdie Sons, ERIC G [[id number]]   I discussed the assessment and treatment plan with the patient. The patient was provided an opportunity to ask questions and all were answered. The patient agreed with the plan and demonstrated an understanding of the instructions.   The patient was advised to call back or seek an in-person evaluation if the symptoms worsen or if the condition fails to improve as anticipated.   Bethanie Dicker, NP [facility] [phone] (phone) [phone] (fax)  [facility]

## 2023-10-19 NOTE — Telephone Encounter (Signed)
Copied from CRM [id number]. Topic: Clinical - Medication Question >> Oct 19, 2023  9:25 AM Theodis Sato wrote: Reason for CRM: Patient has a candida infection in his mouth and was seen by Dr.Sonnenberg on 2/7 - Patient states the medication prescribed has not helped at all and was advised by the provider to call clinic to request a different medication .  Chief Complaint: Oral thrush Symptoms: Oral thrush Frequency: 12 days Pertinent Negatives: Patient denies relief Disposition: [] ED /[] Urgent Care (no appt availability in office) / [x] Appointment(In office/virtual)/ []  [facility]/ [] Home Care/ [] Refused Recommended Disposition /[] [facility]/ []  Follow-up with PCP Additional Notes: Patient was seen in office on 10/07/23 for oral thrush. Patient was prescribed Mycostatin and instructed to call back if symptoms did not resolve, per chart. Patient stated the oral thrush is still the same as it was at the OV. Patient denied fever and additional symptoms. This RN advised patient to follow-up with a provider within 24 hours, per protocol. No availability today with PCP. Same day virtual appointment scheduled with alternate provider in office. This RN advised patient to call back if symptoms worsen. Patient complied.   Reason for Disposition  [1] Taking antibiotic > 72 hours (3 days) AND [2] symptoms (other than fever) not improved  Answer Assessment - Initial Assessment Questions 1. INFECTION: "What infection is the antibiotic being given for?"     Oral thrush 2. ANTIBIOTIC: "What antibiotic are you taking" "How many times per day?"     Mycostatin  3. DURATION: "When was the antibiotic started?"     10/07/23 4. MAIN CONCERN OR SYMPTOM:  "What is your main concern right now?"     Oral thrush 5. BETTER-SAME-WORSE: "Are you getting better, staying the same, or getting worse compared to when you first started the antibiotics?" If getting worse, ask: "In what way?"      States oral  thrush is about the same 6. FEVER: "Do you have a fever?" If Yes, ask: "What is your temperature, how was it measured, and when did it start?"     Denies 7. SYMPTOMS: "Are there any other symptoms you're concerned about?" If Yes, ask: "When did it start?"     Oral thrush  Protocols used: Infection on Antibiotic Follow-up Call-[facility]

## 2023-10-20 ENCOUNTER — Other Ambulatory Visit: Payer: Self-pay | Admitting: *Deleted

## 2023-10-20 DIAGNOSIS — E291 Testicular hypofunction: Secondary | ICD-10-CM

## 2023-10-20 DIAGNOSIS — Z09 Encounter for follow-up examination after completed treatment for conditions other than malignant neoplasm: Secondary | ICD-10-CM

## 2023-10-24 ENCOUNTER — Inpatient Hospital Stay: Payer: Medicaid Other | Attending: Oncology | Admitting: Oncology

## 2023-10-24 ENCOUNTER — Inpatient Hospital Stay: Payer: Medicaid Other

## 2023-10-24 ENCOUNTER — Encounter: Payer: Self-pay | Admitting: Oncology

## 2023-10-24 VITALS — BP 149/102 | HR 86 | Temp 98.2°F | Resp 16 | Ht 73.0 in | Wt 193.0 lb

## 2023-10-24 DIAGNOSIS — D751 Secondary polycythemia: Secondary | ICD-10-CM | POA: Insufficient documentation

## 2023-10-24 DIAGNOSIS — Z923 Personal history of irradiation: Secondary | ICD-10-CM | POA: Diagnosis not present

## 2023-10-24 DIAGNOSIS — Z79899 Other long term (current) drug therapy: Secondary | ICD-10-CM | POA: Diagnosis not present

## 2023-10-24 DIAGNOSIS — Z8589 Personal history of malignant neoplasm of other organs and systems: Secondary | ICD-10-CM | POA: Insufficient documentation

## 2023-10-24 DIAGNOSIS — Z87891 Personal history of nicotine dependence: Secondary | ICD-10-CM | POA: Insufficient documentation

## 2023-10-24 LAB — CBC (CANCER CENTER ONLY)
HCT: 52.9 % — ABNORMAL HIGH (ref 39.0–52.0)
Hemoglobin: 17.7 g/dL — ABNORMAL HIGH (ref 13.0–17.0)
MCH: 33.2 pg (ref 26.0–34.0)
MCHC: 33.5 g/dL (ref 30.0–36.0)
MCV: 99.2 fL (ref 80.0–100.0)
Platelet Count: 226 10*3/uL (ref 150–400)
RBC: 5.33 MIL/uL (ref 4.22–5.81)
RDW: 12.5 % (ref 11.5–15.5)
WBC Count: 8.4 10*3/uL (ref 4.0–10.5)
nRBC: 0 % (ref 0.0–0.2)

## 2023-10-24 LAB — IRON AND TIBC
Iron: 77 ug/dL (ref 45–182)
Saturation Ratios: 21 % (ref 17.9–39.5)
TIBC: 367 ug/dL (ref 250–450)
UIBC: 290 ug/dL

## 2023-10-24 LAB — FERRITIN: Ferritin: 119 ng/mL (ref 24–336)

## 2023-10-24 NOTE — Progress Notes (Signed)
 [facility]  Telephone:(336) [phone] Fax:([phone]  ID: Zenovia Jordan OB: 23-Jul-1966  MR#: [id number]  QIO#:[id number]  Patient Care Team: Glori Luis, MD as PCP - General (Family Medicine)  CHIEF COMPLAINT: Polycythemia.  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  He currently feels well and is asymptomatic.  He has no neurologic complaints.  He denies any recent fevers or illnesses.  He has a good appetite and denies weight loss.  He has no chest pain, shortness of breath, cough, or hemoptysis.  He denies any nausea, vomiting, constipation, or diarrhea.  He has no urinary complaints.  Patient offers no specific complaints today.  REVIEW OF SYSTEMS:   Review of Systems  Constitutional: Negative.  Negative for fever, malaise/fatigue and weight loss.  Respiratory: Negative.  Negative for cough, hemoptysis and shortness of breath.   Cardiovascular: Negative.  Negative for chest pain and leg swelling.  Gastrointestinal: Negative.  Negative for abdominal pain.  Genitourinary: Negative.  Negative for dysuria.  Musculoskeletal: Negative.  Negative for back pain.  Skin: Negative.  Negative for rash.  Neurological: Negative.  Negative for dizziness, focal weakness, weakness and headaches.  Psychiatric/Behavioral: Negative.  The patient is not nervous/anxious.     As per HPI. Otherwise, a complete review of systems is negative.  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     PAST SURGICAL HISTORY: Past Surgical History:  Procedure Laterality Date   APPENDECTOMY  2008   COLONOSCOPY WITH PROPOFOL N/A 08/14/2020   Procedure:  COLONOSCOPY WITH PROPOFOL;  Surgeon: Pasty Spillers, MD;  Location: [facility] ENDOSCOPY;  Service: Endoscopy;  Laterality: N/A;   DIAGNOSTIC LARYNGOSCOPY  06/17/2016   Procedure: DIAGNOSTIC LARYNGOSCOPY WITH BIOPSIES;  Surgeon: Bud Face, MD;  Location: [facility] ORS;  Service: ENT;;   Moles removed  2005   RHINOPLASTY  1990   TONSILLECTOMY N/A 06/17/2016   Procedure: TONSILLECTOMY;  Surgeon: Bud Face, MD;  Location: [facility] ORS;  Service: ENT;  Laterality: N/A;   TONSILLECTOMY      FAMILY HISTORY: Family History  Problem Relation Age of Onset   Cancer Father        Skin   Hyperlipidemia Father    Hypertension Father        Possible   Diabetes Paternal Uncle     ADVANCED DIRECTIVES (Y/N):  N  HEALTH MAINTENANCE: Social History   Tobacco Use   Smoking status: Former    Types: Cigarettes   Smokeless tobacco: Never   Tobacco comments:    "YEARS AGO"  Vaping Use   Vaping status: Never Used  Substance Use Topics   Alcohol use: No    Comment: Occasionally   Drug use: No     Colonoscopy:  PAP:  Bone density:  Lipid panel:  Allergies  Allergen Reactions   Cat Dander Other (See Comments)   Milk-Related Compounds     Current Outpatient Medications  Medication Sig Dispense Refill   albuterol (VENTOLIN HFA) 108 (90 Base) MCG/ACT inhaler INHALE 1 PUFF BY MOUTH EVERY 6 HOURS AS NEEDED FOR WHEEZING 7 each 0   clotrimazole (MYCELEX) 10 MG troche Take 1  tablet (10 mg total) by mouth 5 (five) times daily. X 7- 14 days. Slowly dissolve in mouth. Do not chew or swallow whole. 70 tablet 0   fluticasone-salmeterol (ADVAIR) 250-50 MCG/ACT AEPB INHALE 1 DOSE BY MOUTH TWICE DAILY (IN  THE  MORNING  AND  EVENING)  RINSE  MOUTH  AFTER  USE  (FOR  FURTHER  REFILLS  CONTACT  PCP) 60 each 0   magnesium 30 MG tablet Take 30 mg by mouth 2 (two) times daily.     Melatonin 1 MG CAPS Take by mouth.     Multiple Vitamins-Minerals (MULTIVITAMIN WITH MINERALS) tablet Take by mouth.      valACYclovir (VALTREX) 1000 MG tablet Take 1 tablet by mouth once daily 90 tablet 1   chlorhexidine (PERIDEX) 0.12 % solution RINSE WITH ONE-HALF OUNCE AND SPIT TWICE DAILY (Patient not taking: Reported on 10/24/2023)     EPINEPHrine 0.3 mg/0.3 mL IJ SOAJ injection Inject into the muscle. (Patient not taking: Reported on 10/24/2023) 1 each 0   hydroxychloroquine (PLAQUENIL) 200 MG tablet  (Patient not taking: Reported on 10/24/2023)     mometasone (ELOCON) 0.1 % cream Apply 1 Application topically as directed. Qd to bid up to 5 days a week aa groin until clear (Patient not taking: Reported on 10/24/2023) 50 g 1   neomycin-polymyxin-hydrocortisone (CORTISPORIN) OTIC solution Place 4 drops into both ears 3 (three) times daily. (Patient not taking: Reported on 10/24/2023) 10 mL 0   podofilox (CONDYLOX) 0.5 % gel Spot treat affected as needed. (Patient not taking: Reported on 10/24/2023) 3.5 g 2   testosterone cypionate (DEPOTESTOSTERONE CYPIONATE) 200 MG/ML injection INJECT INTO THE MUSCLE ONCE WEEKLY (Patient not taking: Reported on 10/24/2023) 10 mL 0   No current facility-administered medications for this visit.    OBJECTIVE: Vitals:   10/24/23 1121  BP: (!) 149/102  Pulse: 86  Resp: 16  Temp: 98.2 F (36.8 C)  SpO2: 98%     Body mass index is 25.46 kg/m.    ECOG FS:0 - Asymptomatic  General: Well-developed, well-nourished, no acute distress. Eyes: Pink conjunctiva, anicteric sclera. HEENT: Normocephalic, moist mucous membranes. Lungs: No audible wheezing or coughing. Heart: Regular rate and rhythm. Abdomen: Soft, nontender, no obvious distention. Musculoskeletal: No edema, cyanosis, or clubbing. Neuro: Alert, answering all questions appropriately. Cranial nerves grossly intact. Skin: No rashes or petechiae noted. Psych: Normal affect. Lymphatics: No cervical, calvicular, axillary or inguinal LAD.   LAB RESULTS:  Lab Results  Component Value Date   NA 137 09/30/2023   K 4.5  09/30/2023   CL 98 09/30/2023   CO2 30 09/30/2023   GLUCOSE 97 09/30/2023   BUN 30 (H) 09/30/2023   CREATININE 0.91 09/30/2023   CALCIUM 9.2 09/30/2023   PROT 7.7 09/30/2023   ALBUMIN 4.2 09/30/2023   AST 30 09/30/2023   ALT 26 09/30/2023   ALKPHOS 55 09/30/2023   BILITOT 1.2 09/30/2023   GFRNONAA >60 09/30/2023   GFRAA >60 03/30/2018    Lab Results  Component Value Date   WBC 8.4 10/24/2023   NEUTROABS 12.6 (H) 09/30/2023   HGB 17.7 (H) 10/24/2023   HCT 52.9 (H) 10/24/2023   MCV 99.2 10/24/2023   PLT 226 10/24/2023     STUDIES: CT Soft Tissue Neck W Contrast Result Date: 09/30/2023 CLINICAL DATA:  Ear infection with neck pain EXAM: CT NECK WITH CONTRAST TECHNIQUE: Multidetector CT imaging of the neck was performed using the standard protocol following the bolus administration of intravenous contrast.  RADIATION DOSE REDUCTION: This exam was performed according to the departmental dose-optimization program which includes automated exposure control, adjustment of the mA and/or kV according to patient size and/or use of iterative reconstruction technique. CONTRAST:  75mL OMNIPAQUE IOHEXOL 300 MG/ML  SOLN COMPARISON:  03/04/2020 FINDINGS: Pharynx and larynx: Normal. No mass or swelling. Salivary glands: No inflammation, mass, or stone. Thyroid: Normal. Lymph nodes: None enlarged or abnormal density. Vascular: Negative. Limited intracranial: Negative. Visualized orbits: Negative. Mastoids and visualized paranasal sinuses: Clear. Skeleton: No acute or aggressive process. Upper chest: Emphysema Other: None. IMPRESSION: 1. No acute abnormality of the neck. Electronically Signed   By: Deatra Robinson M.D.   On: 09/30/2023 19:36   CT C-SPINE NO CHARGE Result Date: 09/30/2023 CLINICAL DATA:  Neck pain EXAM: CT Cervical Spine without contrast TECHNIQUE: Multiplanar CT images of the cervical spine were reconstructed from contemporary CT of the Neck. RADIATION DOSE REDUCTION: This exam was performed  according to the departmental dose-optimization program which includes automated exposure control, adjustment of the mA and/or kV according to patient size and/or use of iterative reconstruction technique. CONTRAST:  None or No additional COMPARISON:  CT 04/05/2018 FINDINGS: Alignment: Facet alignment normal.  No subluxation Skull base and vertebrae: No acute fracture. No primary bone lesion or focal pathologic process. Soft tissues and spinal canal: No prevertebral fluid or swelling. No visible canal hematoma. Disc levels: Minimal disc space narrowing C6-C7. Multilevel facet degenerative changes. Chronic irregular left facet degenerative changes at C3-C4 with foraminal narrowing. Upper chest: Apical emphysema Other: None IMPRESSION: 1. No CT evidence for acute osseous abnormality. 2. Degenerative changes. 3. Emphysema. Emphysema (ICD10-J43.9). Electronically Signed   By: Jasmine Pang M.D.   On: 09/30/2023 19:26    ASSESSMENT: Polycythemia.  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  Patient's hemoglobin is mildly elevated at 17.7, the remainder of his laboratory work including iron stores and JAK2 mutation with reflex are pending at time of dictation.  Goal hemoglobin is 17.0.  In the future, patient can likely donate blood at [facility] or [facility].  Return to clinic in 1 week for phlebotomy only.  He will then return to clinic in approximately 5 weeks for repeat laboratory work, further evaluation, and consideration of additional phlebotomy.   History of head and neck cancer: Patient underwent chemotherapy and radiation therapy over 6 years ago.  No further intervention is needed.  He reports he has been discharged from ENT services. Low testosterone: Continue treatment and supplementation per urology.  I spent a total of 45 minutes reviewing chart data, face-to-face evaluation with the patient, counseling and coordination of care as detailed above.   Patient expressed  understanding and was in agreement with this plan. He also understands that He can call clinic at any time with any questions, concerns, or complaints.     Jeralyn Ruths, MD   10/24/2023 12:57 PM

## 2023-10-25 ENCOUNTER — Telehealth: Payer: Self-pay | Admitting: Oncology

## 2023-10-25 LAB — CARBON MONOXIDE, BLOOD (PERFORMED AT REF LAB): Carbon Monoxide, Blood: 2.6 % (ref 0.0–3.6)

## 2023-10-25 LAB — ERYTHROPOIETIN: Erythropoietin: 11 m[IU]/mL (ref 2.6–18.5)

## 2023-10-25 NOTE — Telephone Encounter (Signed)
 Patient called to request phlebotomy appointment be moved to next day. Appointment rescheduled as requested

## 2023-10-27 DIAGNOSIS — J301 Allergic rhinitis due to pollen: Secondary | ICD-10-CM | POA: Diagnosis not present

## 2023-10-27 DIAGNOSIS — R1314 Dysphagia, pharyngoesophageal phase: Secondary | ICD-10-CM | POA: Diagnosis not present

## 2023-10-27 DIAGNOSIS — Z85818 Personal history of malignant neoplasm of other sites of lip, oral cavity, and pharynx: Secondary | ICD-10-CM | POA: Diagnosis not present

## 2023-10-27 DIAGNOSIS — H6063 Unspecified chronic otitis externa, bilateral: Secondary | ICD-10-CM | POA: Diagnosis not present

## 2023-10-27 DIAGNOSIS — B379 Candidiasis, unspecified: Secondary | ICD-10-CM | POA: Diagnosis not present

## 2023-10-31 LAB — JAK2 V617F RFX CALR/MPL/E12-15

## 2023-10-31 LAB — CALR +MPL + E12-E15  (REFLEX)

## 2023-11-02 ENCOUNTER — Inpatient Hospital Stay: Payer: Medicaid Other | Attending: Oncology

## 2023-11-02 VITALS — BP 122/87 | HR 88 | Temp 97.9°F | Resp 17

## 2023-11-02 DIAGNOSIS — D751 Secondary polycythemia: Secondary | ICD-10-CM | POA: Insufficient documentation

## 2023-11-02 NOTE — Progress Notes (Signed)
 Patient tolatered phlebotomy well today, performed in RAC using 20g angiocath. 500 cc removed as ordered 17.7 hgb . No concerns voiced. Snack and po fluids offered. Stable at discharge. Refused AVS .

## 2023-11-02 NOTE — Patient Instructions (Signed)

## 2023-11-10 DIAGNOSIS — Z85818 Personal history of malignant neoplasm of other sites of lip, oral cavity, and pharynx: Secondary | ICD-10-CM | POA: Diagnosis not present

## 2023-11-10 DIAGNOSIS — R1314 Dysphagia, pharyngoesophageal phase: Secondary | ICD-10-CM | POA: Diagnosis not present

## 2023-11-10 DIAGNOSIS — H6063 Unspecified chronic otitis externa, bilateral: Secondary | ICD-10-CM | POA: Diagnosis not present

## 2023-11-10 DIAGNOSIS — J301 Allergic rhinitis due to pollen: Secondary | ICD-10-CM | POA: Diagnosis not present

## 2023-11-10 DIAGNOSIS — B379 Candidiasis, unspecified: Secondary | ICD-10-CM | POA: Diagnosis not present

## 2023-11-21 ENCOUNTER — Ambulatory Visit: Payer: Medicaid Other | Admitting: Oncology

## 2023-11-22 ENCOUNTER — Telehealth: Payer: Self-pay | Admitting: Oncology

## 2023-11-22 NOTE — Telephone Encounter (Signed)
 Patient called to reschedule his appointment- he has another dr appointment.   Appointments rescheduled as requested

## 2023-11-29 ENCOUNTER — Other Ambulatory Visit: Payer: Medicaid Other

## 2023-11-29 ENCOUNTER — Ambulatory Visit: Payer: Medicaid Other | Admitting: Oncology

## 2023-12-02 ENCOUNTER — Other Ambulatory Visit: Payer: Self-pay

## 2023-12-02 ENCOUNTER — Other Ambulatory Visit: Payer: Self-pay | Admitting: *Deleted

## 2023-12-02 DIAGNOSIS — D751 Secondary polycythemia: Secondary | ICD-10-CM

## 2023-12-02 DIAGNOSIS — B009 Herpesviral infection, unspecified: Secondary | ICD-10-CM

## 2023-12-02 MED ORDER — VALACYCLOVIR HCL 1 G PO TABS: ORAL_TABLET | ORAL | 1 refills | Status: DC

## 2023-12-05 ENCOUNTER — Inpatient Hospital Stay

## 2023-12-05 ENCOUNTER — Inpatient Hospital Stay (HOSPITAL_BASED_OUTPATIENT_CLINIC_OR_DEPARTMENT_OTHER): Admitting: Oncology

## 2023-12-05 ENCOUNTER — Encounter: Payer: Self-pay | Admitting: Oncology

## 2023-12-05 ENCOUNTER — Inpatient Hospital Stay: Attending: Oncology

## 2023-12-05 VITALS — BP 105/76 | HR 79 | Temp 97.2°F | Resp 16 | Ht 73.0 in | Wt 205.0 lb

## 2023-12-05 DIAGNOSIS — D751 Secondary polycythemia: Secondary | ICD-10-CM | POA: Diagnosis not present

## 2023-12-05 DIAGNOSIS — Z87891 Personal history of nicotine dependence: Secondary | ICD-10-CM | POA: Insufficient documentation

## 2023-12-05 LAB — CBC WITH DIFFERENTIAL/PLATELET
Abs Immature Granulocytes: 0.1 10*3/uL — ABNORMAL HIGH (ref 0.00–0.07)
Basophils Absolute: 0.1 10*3/uL (ref 0.0–0.1)
Basophils Relative: 1 %
Eosinophils Absolute: 0.6 10*3/uL — ABNORMAL HIGH (ref 0.0–0.5)
Eosinophils Relative: 6 %
HCT: 47.6 % (ref 39.0–52.0)
Hemoglobin: 15.7 g/dL (ref 13.0–17.0)
Immature Granulocytes: 1 %
Lymphocytes Relative: 18 %
Lymphs Abs: 1.8 10*3/uL (ref 0.7–4.0)
MCH: 32.5 pg (ref 26.0–34.0)
MCHC: 33 g/dL (ref 30.0–36.0)
MCV: 98.6 fL (ref 80.0–100.0)
Monocytes Absolute: 1.3 10*3/uL — ABNORMAL HIGH (ref 0.1–1.0)
Monocytes Relative: 13 %
Neutro Abs: 6.1 10*3/uL (ref 1.7–7.7)
Neutrophils Relative %: 61 %
Platelets: 223 10*3/uL (ref 150–400)
RBC: 4.83 MIL/uL (ref 4.22–5.81)
RDW: 13.5 % (ref 11.5–15.5)
WBC: 10 10*3/uL (ref 4.0–10.5)
nRBC: 0 % (ref 0.0–0.2)

## 2023-12-05 LAB — FERRITIN: Ferritin: 54 ng/mL (ref 24–336)

## 2023-12-05 NOTE — Progress Notes (Signed)
No phleb today.

## 2023-12-05 NOTE — Progress Notes (Signed)
 [facility]  Telephone:(336) [phone] Fax:(336) [phone]  ID: Philip Cordova OB: 1966/05/16  MR#: [id number]  AOZ#:[id number]  Patient Care Team: Glori Luis, MD (Inactive) as PCP - General (Family Medicine)  CHIEF COMPLAINT: Polycythemia.  INTERVAL HISTORY: Patient returns to clinic today for repeat laboratory, further evaluation, and consideration of additional phlebotomy.  Philip Cordova continues to feel well and remains asymptomatic. Philip Cordova has no neurologic complaints.  Philip Cordova denies any recent fevers or illnesses.  Philip Cordova has a good appetite and denies weight loss.  Philip Cordova has no chest pain, shortness of breath, cough, or hemoptysis.  Philip Cordova denies any nausea, vomiting, constipation, or diarrhea.  Philip Cordova has no urinary complaints.  Patient offers no specific complaints today.  REVIEW OF SYSTEMS:   Review of Systems  Constitutional: Negative.  Negative for fever, malaise/fatigue and weight loss.  Respiratory: Negative.  Negative for cough, hemoptysis and shortness of breath.   Cardiovascular: Negative.  Negative for chest pain and leg swelling.  Gastrointestinal: Negative.  Negative for abdominal pain.  Genitourinary: Negative.  Negative for dysuria.  Musculoskeletal: Negative.  Negative for back pain.  Skin: Negative.  Negative for rash.  Neurological: Negative.  Negative for dizziness, focal weakness, weakness and headaches.  Psychiatric/Behavioral: Negative.  The patient is not nervous/anxious.     As per HPI. Otherwise, a complete review of systems is negative.  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     PAST SURGICAL HISTORY: Past Surgical History:  Procedure Laterality Date   APPENDECTOMY  2008   COLONOSCOPY WITH PROPOFOL N/A 08/14/2020   Procedure: COLONOSCOPY WITH  PROPOFOL;  Surgeon: Pasty Spillers, MD;  Location: [facility] ENDOSCOPY;  Service: Endoscopy;  Laterality: N/A;   DIAGNOSTIC LARYNGOSCOPY  06/17/2016   Procedure: DIAGNOSTIC LARYNGOSCOPY WITH BIOPSIES;  Surgeon: Bud Face, MD;  Location: [facility] ORS;  Service: ENT;;   Moles removed  2005   RHINOPLASTY  1990   TONSILLECTOMY N/A 06/17/2016   Procedure: TONSILLECTOMY;  Surgeon: Bud Face, MD;  Location: [facility] ORS;  Service: ENT;  Laterality: N/A;   TONSILLECTOMY      FAMILY HISTORY: Family History  Problem Relation Age of Onset   Cancer Father        Skin   Hyperlipidemia Father    Hypertension Father        Possible   Diabetes Paternal Uncle     ADVANCED DIRECTIVES (Y/N):  N  HEALTH MAINTENANCE: Social History   Tobacco Use   Smoking status: Former    Types: Cigarettes   Smokeless tobacco: Never   Tobacco comments:    "YEARS AGO"  Vaping Use   Vaping status: Never Used  Substance Use Topics   Alcohol use: No    Comment: Occasionally   Drug use: No     Colonoscopy:  PAP:  Bone density:  Lipid panel:  Allergies  Allergen Reactions   Cat Dander Other (See Comments)   Milk-Related Compounds     Current Outpatient Medications  Medication Sig Dispense Refill   albuterol (VENTOLIN HFA) 108 (90 Base) MCG/ACT inhaler INHALE 1 PUFF BY MOUTH EVERY 6 HOURS AS NEEDED FOR WHEEZING 7 each 0   chlorhexidine (PERIDEX) 0.12 % solution RINSE WITH ONE-HALF OUNCE AND SPIT TWICE DAILY (Patient not taking:  Reported on 12/05/2023)     clotrimazole (MYCELEX) 10 MG troche Take 1 tablet (10 mg total) by mouth 5 (five) times daily. X 7- 14 days. Slowly dissolve in mouth. Do not chew or swallow whole. 70 tablet 0   EPINEPHrine 0.3 mg/0.3 mL IJ SOAJ injection Inject into the muscle. (Patient not taking: Reported on 12/05/2023) 1 each 0   fluticasone-salmeterol (ADVAIR) 250-50 MCG/ACT AEPB INHALE 1 DOSE BY MOUTH TWICE DAILY (IN  THE  MORNING  AND  EVENING)  RINSE  MOUTH  AFTER  USE   (FOR  FURTHER  REFILLS  CONTACT  PCP) 60 each 0   hydroxychloroquine (PLAQUENIL) 200 MG tablet  (Patient not taking: Reported on 10/19/2023)     magnesium 30 MG tablet Take 30 mg by mouth 2 (two) times daily.     Melatonin 1 MG CAPS Take by mouth.     mometasone (ELOCON) 0.1 % cream Apply 1 Application topically as directed. Qd to bid up to 5 days a week aa groin until clear (Patient not taking: Reported on 10/19/2023) 50 g 1   Multiple Vitamins-Minerals (MULTIVITAMIN WITH MINERALS) tablet Take by mouth.     neomycin-polymyxin-hydrocortisone (CORTISPORIN) OTIC solution Place 4 drops into both ears 3 (three) times daily. (Patient not taking: Reported on 10/19/2023) 10 mL 0   podofilox (CONDYLOX) 0.5 % gel Spot treat affected as needed. (Patient not taking: Reported on 12/05/2023) 3.5 g 2   testosterone cypionate (DEPOTESTOSTERONE CYPIONATE) 200 MG/ML injection INJECT INTO THE MUSCLE ONCE WEEKLY (Patient not taking: Reported on 12/05/2023) 10 mL 0   valACYclovir (VALTREX) 1000 MG tablet Take 1 tablet by mouth once daily 90 tablet 1   No current facility-administered medications for this visit.    OBJECTIVE: Vitals:   12/05/23 1517  BP: 105/76  Pulse: 79  Resp: 16  Temp: (!) 97.2 F (36.2 C)  SpO2: 98%     Body mass index is 27.05 kg/m.    ECOG FS:0 - Asymptomatic  General: Well-developed, well-nourished, no acute distress. Eyes: Pink conjunctiva, anicteric sclera. HEENT: Normocephalic, moist mucous membranes. Lungs: No audible wheezing or coughing. Heart: Regular rate and rhythm. Abdomen: Soft, nontender, no obvious distention. Musculoskeletal: No edema, cyanosis, or clubbing. Neuro: Alert, answering all questions appropriately. Cranial nerves grossly intact. Skin: No rashes or petechiae noted. Psych: Normal affect.  LAB RESULTS:  Lab Results  Component Value Date   NA 137 09/30/2023   K 4.5 09/30/2023   CL 98 09/30/2023   CO2 30 09/30/2023   GLUCOSE 97 09/30/2023   BUN 30  (H) 09/30/2023   CREATININE 0.91 09/30/2023   CALCIUM 9.2 09/30/2023   PROT 7.7 09/30/2023   ALBUMIN 4.2 09/30/2023   AST 30 09/30/2023   ALT 26 09/30/2023   ALKPHOS 55 09/30/2023   BILITOT 1.2 09/30/2023   GFRNONAA >60 09/30/2023   GFRAA >60 03/30/2018    Lab Results  Component Value Date   WBC 10.0 12/05/2023   NEUTROABS 6.1 12/05/2023   HGB 15.7 12/05/2023   HCT 47.6 12/05/2023   MCV 98.6 12/05/2023   PLT 223 12/05/2023     STUDIES: No results found.   ASSESSMENT: Polycythemia.  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  Patient's hemoglobin has improved to 15.7.  Previously, all of patient's other laboratory work including iron stores and JAK2 mutation with reflex are negative or within normal limits.  Goal hemoglobin is 17.0.  In the future, patient can likely donate blood at [facility] or [facility].  Patient does not require phlebotomy today.  Return to clinic in 3 months for laboratory work and consideration of phlebotomy.  Patient will then return to clinic in 6 months for further evaluation and continuation of treatment if needed. History of head and neck cancer: Patient underwent chemotherapy and radiation therapy over 6 years ago.  No further intervention is needed.  Philip Cordova reports Philip Cordova has been discharged from ENT services. Low testosterone: Continue treatment and supplementation per urology.  I spent a total of 20 minutes reviewing chart data, face-to-face evaluation with the patient, counseling and coordination of care as detailed above.   Patient expressed understanding and was in agreement with this plan. Philip Cordova also understands that Philip Cordova can call clinic at any time with any questions, concerns, or complaints.     Jeralyn Ruths, MD   12/05/2023 3:30 PM

## 2023-12-06 DIAGNOSIS — J301 Allergic rhinitis due to pollen: Secondary | ICD-10-CM | POA: Diagnosis not present

## 2023-12-07 DIAGNOSIS — J301 Allergic rhinitis due to pollen: Secondary | ICD-10-CM | POA: Diagnosis not present

## 2023-12-20 ENCOUNTER — Ambulatory Visit (INDEPENDENT_AMBULATORY_CARE_PROVIDER_SITE_OTHER)

## 2023-12-20 VITALS — BP 100/70 | HR 84 | Temp 98.1°F | Ht 73.0 in | Wt 218.4 lb

## 2023-12-20 DIAGNOSIS — J4530 Mild persistent asthma, uncomplicated: Secondary | ICD-10-CM

## 2023-12-20 DIAGNOSIS — Z0000 Encounter for general adult medical examination without abnormal findings: Secondary | ICD-10-CM | POA: Diagnosis not present

## 2023-12-20 DIAGNOSIS — Z13220 Encounter for screening for lipoid disorders: Secondary | ICD-10-CM | POA: Diagnosis not present

## 2023-12-20 DIAGNOSIS — A6000 Herpesviral infection of urogenital system, unspecified: Secondary | ICD-10-CM

## 2023-12-20 DIAGNOSIS — Z131 Encounter for screening for diabetes mellitus: Secondary | ICD-10-CM

## 2023-12-20 MED ORDER — VALACYCLOVIR HCL 1 G PO TABS: ORAL_TABLET | ORAL | 3 refills | Status: DC

## 2023-12-20 NOTE — Assessment & Plan Note (Signed)
 Chronic issue.  No recent exacerbation. Not needing rescue inhaler.

## 2023-12-20 NOTE — Progress Notes (Signed)
 Established Patient Office Visit   Subjective  Patient ID: Philip Cordova, male    DOB: [birth year]  Age: 58 y.o. MRN: [id number]  Chief Complaint  Patient presents with   Transitions Of Care    He  has a past medical history of Asthma, Athlete's heart, Chickenpox, Herpes (12/16/2020), and Squamous cell carcinoma of head and neck (04/09/2016).  HPI Established patient of Dr. Lovetta Rucks presenting for transfer of care. No new concerns today.  1) Mild persistent asthma: On Advair and albuterol  prn. Does not recall when he used Albuterol  inhaler.    2) Polycythemia: Sees heme/onc  3) Genital herpes: Takes Valtrex  daily for suppression. Last flare up was about a year ago.  4) Patient exercises regularly, eats healthy diet.   5) Patient sees urology and is on testosterone  therapy.     ROS As per HPI    Objective:     BP 100/70   Pulse 84   Temp 98.1 F (36.7 C) (Oral)   Ht 6\' 1"  (1.854 m)   Wt 218 lb 6.4 oz (99.1 kg)   SpO2 95%   BMI 28.81 kg/m      12/20/2023    3:16 PM 02/02/2023   11:50 AM 11/19/2022    4:41 PM  Depression screen PHQ 2/9  Decreased Interest 0 0 0  Down, Depressed, Hopeless 0 0 0  PHQ - 2 Score 0 0 0  Altered sleeping 0 0 0  Tired, decreased energy 0 0 0  Change in appetite 0 0 0  Feeling bad or failure about yourself  0 0 0  Trouble concentrating 0 0 0  Moving slowly or fidgety/restless 0 0 0  Suicidal thoughts 0 0 0  PHQ-9 Score 0 0 0  Difficult doing work/chores  Not difficult at all Not difficult at all      12/20/2023    3:16 PM 02/02/2023   11:50 AM 11/19/2022    4:42 PM  GAD 7 : Generalized Anxiety Score  Nervous, Anxious, on Edge 0 0 0  Control/stop worrying 0 0 0  Worry too much - different things 0 0 0  Trouble relaxing 0 0 0  Restless 0 0 0  Easily annoyed or irritable 0 0 0  Afraid - awful might happen 0 0 0  Total GAD 7 Score 0 0 0  Anxiety Difficulty  Not difficult at all Not difficult at all    Physical  Exam Constitutional:      Appearance: Normal appearance.  HENT:     Head: Normocephalic and atraumatic.     Right Ear: Tympanic membrane normal.     Left Ear: Tympanic membrane normal.     Mouth/Throat:     Mouth: Mucous membranes are moist.  Cardiovascular:     Rate and Rhythm: Normal rate.     Pulses: Normal pulses.  Pulmonary:     Effort: Pulmonary effort is normal.     Breath sounds: Normal breath sounds. No wheezing.  Abdominal:     General: Bowel sounds are normal.     Palpations: Abdomen is soft.  Musculoskeletal:     Cervical back: Normal range of motion and neck supple.     Right lower leg: No edema.     Left lower leg: No edema.  Skin:    General: Skin is warm.  Neurological:     Mental Status: He is alert and oriented to person, place, and time.        No results found  for any visits on 12/20/23.  The 10-year ASCVD risk score (Arnett DK, et al., 2019) is: 4.2%    Assessment & Plan:  Annual physical exam  Herpes simplex infection of genitourinary system Assessment & Plan: Chronic intermittent issue. On suppression therapy with Valtrex  1000 mg daily. Continue refill sent.   Orders: -     valACYclovir  HCl; Take 1 tablet by mouth once daily  Dispense: 90 tablet; Refill: 3  Lipid screening Assessment & Plan: Continue healthy eating, exercising. We will obtain CMP and lipid panel today.   Orders: -     Lipid panel  Screening for diabetes mellitus Assessment & Plan: Continue healthy eating, exercising. We will obtain CMP and lipid panel today.   Orders: -     Comprehensive metabolic panel with GFR  Mild persistent asthma without complication Assessment & Plan: Chronic issue.  No recent exacerbation. Not needing rescue inhaler.      Return in about 6 months (around 06/20/2024) for Med check .   Jacklin Mascot, MD

## 2023-12-20 NOTE — Assessment & Plan Note (Signed)
 Chronic intermittent issue. On suppression therapy with Valtrex  1000 mg daily. Continue refill sent.

## 2023-12-20 NOTE — Assessment & Plan Note (Addendum)
 Continue healthy eating, exercising. We will obtain CMP and lipid panel today.

## 2023-12-21 LAB — COMPREHENSIVE METABOLIC PANEL WITH GFR
ALT: 44 U/L (ref 0–53)
AST: 24 U/L (ref 0–37)
Albumin: 4.3 g/dL (ref 3.5–5.2)
Alkaline Phosphatase: 66 U/L (ref 39–117)
BUN: 20 mg/dL (ref 6–23)
CO2: 33 meq/L — ABNORMAL HIGH (ref 19–32)
Calcium: 9.5 mg/dL (ref 8.4–10.5)
Chloride: 98 meq/L (ref 96–112)
Creatinine, Ser: 1.14 mg/dL (ref 0.40–1.50)
GFR: 72.31 mL/min (ref >60.00)
Glucose, Bld: 90 mg/dL (ref 70–99)
Potassium: 4.3 meq/L (ref 3.5–5.1)
Sodium: 138 meq/L (ref 135–145)
Total Bilirubin: 0.6 mg/dL (ref 0.2–1.2)
Total Protein: 7.3 g/dL (ref 6.0–8.3)

## 2023-12-21 LAB — LIPID PANEL
Cholesterol: 201 mg/dL — ABNORMAL HIGH (ref 0–200)
HDL: 43.5 mg/dL (ref >39.00)
LDL Cholesterol: 119 mg/dL — ABNORMAL HIGH (ref 0–99)
NonHDL: 157.47
Total CHOL/HDL Ratio: 5
Triglycerides: 191 mg/dL — ABNORMAL HIGH (ref 0.0–149.0)
VLDL: 38.2 mg/dL (ref 0.0–40.0)

## 2023-12-27 DIAGNOSIS — M310 Hypersensitivity angiitis: Secondary | ICD-10-CM | POA: Diagnosis not present

## 2023-12-27 DIAGNOSIS — Z79899 Other long term (current) drug therapy: Secondary | ICD-10-CM | POA: Diagnosis not present

## 2024-02-10 DIAGNOSIS — R1314 Dysphagia, pharyngoesophageal phase: Secondary | ICD-10-CM | POA: Diagnosis not present

## 2024-02-10 DIAGNOSIS — J301 Allergic rhinitis due to pollen: Secondary | ICD-10-CM | POA: Diagnosis not present

## 2024-02-13 ENCOUNTER — Other Ambulatory Visit: Payer: Self-pay

## 2024-02-13 DIAGNOSIS — J45909 Unspecified asthma, uncomplicated: Secondary | ICD-10-CM

## 2024-02-13 MED ORDER — FLUTICASONE-SALMETEROL 250-50 MCG/ACT IN AEPB: 1.0000 | INHALATION_SPRAY | Freq: Two times a day (BID) | RESPIRATORY_TRACT | 0 refills | Status: DC

## 2024-02-15 ENCOUNTER — Other Ambulatory Visit (HOSPITAL_COMMUNITY): Payer: Self-pay

## 2024-02-15 ENCOUNTER — Encounter: Payer: Self-pay | Admitting: Oncology

## 2024-03-01 ENCOUNTER — Other Ambulatory Visit: Payer: Self-pay | Admitting: *Deleted

## 2024-03-01 DIAGNOSIS — D751 Secondary polycythemia: Secondary | ICD-10-CM

## 2024-03-05 ENCOUNTER — Inpatient Hospital Stay

## 2024-03-05 ENCOUNTER — Inpatient Hospital Stay: Attending: Oncology

## 2024-03-05 VITALS — BP 127/98 | HR 88 | Temp 98.9°F | Resp 16

## 2024-03-05 DIAGNOSIS — Z85818 Personal history of malignant neoplasm of other sites of lip, oral cavity, and pharynx: Secondary | ICD-10-CM | POA: Diagnosis not present

## 2024-03-05 DIAGNOSIS — D751 Secondary polycythemia: Secondary | ICD-10-CM | POA: Insufficient documentation

## 2024-03-05 DIAGNOSIS — E611 Iron deficiency: Secondary | ICD-10-CM | POA: Diagnosis not present

## 2024-03-05 LAB — CBC WITH DIFFERENTIAL/PLATELET
Abs Immature Granulocytes: 0.04 K/uL (ref 0.00–0.07)
Basophils Absolute: 0.1 K/uL (ref 0.0–0.1)
Basophils Relative: 1 %
Eosinophils Absolute: 0.4 K/uL (ref 0.0–0.5)
Eosinophils Relative: 4 %
HCT: 54.1 % — ABNORMAL HIGH (ref 39.0–52.0)
Hemoglobin: 18 g/dL — ABNORMAL HIGH (ref 13.0–17.0)
Immature Granulocytes: 0 %
Lymphocytes Relative: 14 %
Lymphs Abs: 1.3 K/uL (ref 0.7–4.0)
MCH: 32.3 pg (ref 26.0–34.0)
MCHC: 33.3 g/dL (ref 30.0–36.0)
MCV: 97 fL (ref 80.0–100.0)
Monocytes Absolute: 1 K/uL (ref 0.1–1.0)
Monocytes Relative: 10 %
Neutro Abs: 6.6 K/uL (ref 1.7–7.7)
Neutrophils Relative %: 71 %
Platelets: 297 K/uL (ref 150–400)
RBC: 5.58 MIL/uL (ref 4.22–5.81)
RDW: 13 % (ref 11.5–15.5)
WBC: 9.3 K/uL (ref 4.0–10.5)
nRBC: 0 % (ref 0.0–0.2)

## 2024-03-05 LAB — FERRITIN: Ferritin: 50 ng/mL (ref 24–336)

## 2024-03-05 LAB — IRON AND TIBC
Iron: 56 ug/dL (ref 45–182)
Saturation Ratios: 16 % — ABNORMAL LOW (ref 17.9–39.5)
TIBC: 349 ug/dL (ref 250–450)
UIBC: 293 ug/dL

## 2024-03-05 NOTE — Progress Notes (Signed)
 Philip Cordova presents today for phlebotomy per MD orders. Phlebotomy procedure started at 1615 and ended at 1628. 500 mls removed. Patient tolerated procedure well. IV needle removed intact.

## 2024-03-05 NOTE — Patient Instructions (Signed)

## 2024-03-07 ENCOUNTER — Ambulatory Visit (INDEPENDENT_AMBULATORY_CARE_PROVIDER_SITE_OTHER): Payer: Medicaid Other | Admitting: Dermatology

## 2024-03-07 DIAGNOSIS — M310 Hypersensitivity angiitis: Secondary | ICD-10-CM

## 2024-03-07 DIAGNOSIS — W908XXA Exposure to other nonionizing radiation, initial encounter: Secondary | ICD-10-CM

## 2024-03-07 DIAGNOSIS — L578 Other skin changes due to chronic exposure to nonionizing radiation: Secondary | ICD-10-CM | POA: Diagnosis not present

## 2024-03-07 DIAGNOSIS — L821 Other seborrheic keratosis: Secondary | ICD-10-CM | POA: Diagnosis not present

## 2024-03-07 DIAGNOSIS — L820 Inflamed seborrheic keratosis: Secondary | ICD-10-CM | POA: Diagnosis not present

## 2024-03-07 DIAGNOSIS — L570 Actinic keratosis: Secondary | ICD-10-CM

## 2024-03-07 DIAGNOSIS — Z1283 Encounter for screening for malignant neoplasm of skin: Secondary | ICD-10-CM

## 2024-03-07 NOTE — Progress Notes (Unsigned)
 Follow-Up Visit   Subjective  Philip Cordova is a 58 y.o. 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 Wanted to have some spots checked around lips, left Cavness, face and chest.   The following portions of the chart were reviewed this encounter and updated as appropriate: medications, allergies, medical history  Review of Systems:  No other skin or systemic complaints except as noted in HPI or Assessment and Plan.  Objective  Well appearing patient in no apparent distress; mood and affect are within normal limits.   A focused examination was performed of the following areas: Chest, face, nose  Relevant exam findings are noted in the Assessment and Plan.       scalp x 1, nose x 2 (3) Erythematous stuck-on, waxy papule or plaque right Abbs x 5, upper lip x 1, left Honold x 3, midline lower lip x 1 (10) Erythematous thin papules/macules with gritty scale.   Assessment & Plan   History of biopsy proven Leukocytoclastic vasculitis  Chest see photos  Leukocytoclastic vasculitis (HCC) -biopsy-proven Treated with Plaquenil per rheumatologist Has decreased Plaquenil to now 1 pill QOD and is weaning off - he has had recurrence at left chest Chronic and persistent condition with duration or expected duration over one year. Condition is improving with treatment but not currently at goal. Recommend to continue Plaquenil as prescribed  SEBORRHEIC KERATOSIS - Stuck-on, waxy, tan-brown papules and/or plaques  - Benign-appearing - Discussed benign etiology and prognosis. - Observe - Call for any changes  ACTINIC DAMAGE - chronic, secondary to cumulative UV radiation exposure/sun exposure over time - diffuse scaly erythematous macules with underlying dyspigmentation - Recommend daily broad spectrum sunscreen SPF 30+ to sun-exposed areas, reapply every 2 hours as needed.  - Recommend staying in the shade or wearing  long sleeves, sun glasses (UVA+UVB protection) and wide brim hats (4-inch brim around the entire circumference of the hat). - Call for new or changing lesions.   INFLAMED SEBORRHEIC KERATOSIS (3) scalp x 1, nose x 2 (3) Symptomatic, irritating, patient would like treated. Destruction of lesion - scalp x 1, nose x 2 (3) Complexity: simple   Destruction method: cryotherapy   Informed consent: discussed and consent obtained   Timeout:  patient name, date of birth, surgical site, and procedure verified Lesion destroyed using liquid nitrogen: Yes   Region frozen until ice ball extended beyond lesion: Yes   Outcome: patient tolerated procedure well with no complications   Post-procedure details: wound care instructions given    ACTINIC KERATOSIS (10) right Dorsch x 5, upper lip x 1, left Garmon x 3, midline lower lip x 1 (10) Actinic keratoses are precancerous spots that appear secondary to cumulative UV radiation exposure/sun exposure over time. They are chronic with expected duration over 1 year. A portion of actinic keratoses will progress to squamous cell carcinoma of the skin. It is not possible to reliably predict which spots will progress to skin cancer and so treatment is recommended to prevent development of skin cancer.  Recommend daily broad spectrum sunscreen SPF 30+ to sun-exposed areas, reapply every 2 hours as needed.  Recommend staying in the shade or wearing long sleeves, sun glasses (UVA+UVB protection) and wide brim hats (4-inch brim around the entire circumference of the hat). Call for new or changing lesions.  Return for change appt in december to 9 month follow up.  I, Eleanor Blush, CMA, am acting as scribe for [employer]  Hester, MD.   Documentation: I have reviewed the above documentation for accuracy and completeness, and I agree with the above.  Alm Hester, MD

## 2024-03-07 NOTE — Patient Instructions (Addendum)
Seborrheic Keratosis  What causes seborrheic keratoses? Seborrheic keratoses are harmless, common skin growths that first appear during adult life.  As time goes by, more growths appear.  Some people may develop a large number of them.  Seborrheic keratoses appear on both covered and uncovered body parts.  They are not caused by sunlight.  The tendency to develop seborrheic keratoses can be inherited.  They vary in color from skin-colored to gray, brown, or even black.  They can be either smooth or have a rough, warty surface.   Seborrheic keratoses are superficial and look as if they were stuck on the skin.  Under the microscope this type of keratosis looks like layers upon layers of skin.  That is why at times the top layer may seem to fall off, but the rest of the growth remains and re-grows.    Treatment Seborrheic keratoses do not need to be treated, but can easily be removed in the office.  Seborrheic keratoses often cause symptoms when they rub on clothing or jewelry.  Lesions can be in the way of shaving.  If they become inflamed, they can cause itching, soreness, or burning.  Removal of a seborrheic keratosis can be accomplished by freezing, burning, or surgery. If any spot bleeds, scabs, or grows rapidly, please return to have it checked, as these can be an indication of a skin cancer.  Cryotherapy Aftercare  Wash gently with soap and water everyday.   Apply Vaseline and Band-Aid daily until healed.   Actinic keratoses are precancerous spots that appear secondary to cumulative UV radiation exposure/sun exposure over time. They are chronic with expected duration over 1 year. A portion of actinic keratoses will progress to squamous cell carcinoma of the skin. It is not possible to reliably predict which spots will progress to skin cancer and so treatment is recommended to prevent development of skin cancer.  Recommend daily broad spectrum sunscreen SPF 30+ to sun-exposed areas, reapply every  2 hours as needed.  Recommend staying in the shade or wearing long sleeves, sun glasses (UVA+UVB protection) and wide brim hats (4-inch brim around the entire circumference of the hat). Call for new or changing lesions.      Due to recent changes in healthcare laws, you may see results of your pathology and/or laboratory studies on MyChart before the doctors have had a chance to review them. We understand that in some cases there may be results that are confusing or concerning to you. Please understand that not all results are received at the same time and often the doctors may need to interpret multiple results in order to provide you with the best plan of care or course of treatment. Therefore, we ask that you please give [place] 2 business days to thoroughly review all your results before contacting the office for clarification. Should we see a critical lab result, you will be contacted sooner.   If You Need Anything After Your Visit  If you have any questions or concerns for your doctor, please call our main line at [phone] and press option 4 to reach your doctor's medical assistant. If no one answers, please leave a voicemail as directed and we will return your call as soon as possible. Messages left after 4 pm will be answered the following business day.   You may also send [place] a message via MyChart. We typically respond to MyChart messages within 1-2 business days.  For prescription refills, please ask your pharmacy to contact our office. Our  fax number is [phone].  If you have an urgent issue when the clinic is closed that cannot wait until the next business day, you can page your doctor at the number below.    Please note that while we do our best to be available for urgent issues outside of office hours, we are not available 24/7.   If you have an urgent issue and are unable to reach [place], you may choose to seek medical care at your doctor's office, retail clinic, urgent care  center, or emergency room.  If you have a medical emergency, please immediately call 911 or go to the emergency department.  Pager Numbers  - Dr. Gwen Pounds: [phone]  - Dr. Roseanne Reno: [phone]  - Dr. Katrinka Blazing: [phone]   In the event of inclement weather, please call our main line at [phone] for an update on the status of any delays or closures.  Dermatology Medication Tips: Please keep the boxes that topical medications come in in order to help keep track of the instructions about where and how to use these. Pharmacies typically print the medication instructions only on the boxes and not directly on the medication tubes.   If your medication is too expensive, please contact our office at [phone] option 4 or send [place] a message through MyChart.   We are unable to tell what your co-pay for medications will be in advance as this is different depending on your insurance coverage. However, we may be able to find a substitute medication at lower cost or fill out paperwork to get insurance to cover a needed medication.   If a prior authorization is required to get your medication covered by your insurance company, please allow [place] 1-2 business days to complete this process.  Drug prices often vary depending on where the prescription is filled and some pharmacies may offer cheaper prices.  The website www.goodrx.com contains coupons for medications through different pharmacies. The prices here do not account for what the cost may be with help from insurance (it may be cheaper with your insurance), but the website can give you the price if you did not use any insurance.  - You can print the associated coupon and take it with your prescription to the pharmacy.  - You may also stop by our office during regular business hours and pick up a GoodRx coupon card.  - If you need your prescription sent electronically to a different pharmacy, notify our office through [facility] or by  phone at [phone] option 4.     Si Usted Necesita Algo Despus de Su Visita  Tambin puede enviarnos un mensaje a travs de Clinical cytogeneticist. Por lo general respondemos a los mensajes de MyChart en el transcurso de 1 a 2 das hbiles.  Para renovar recetas, por favor pida a su farmacia que se ponga en contacto con nuestra oficina. Annie Sable de fax es [place] [phone].  Si tiene un asunto urgente cuando la clnica est cerrada y que no puede esperar hasta el siguiente da hbil, puede llamar/localizar a su doctor(a) al nmero que aparece a continuacin.   Por favor, tenga en cuenta que aunque hacemos todo lo posible para estar disponibles para asuntos urgentes fuera del horario de [place], no estamos disponibles las 24 horas del da, los 7 [address]  [address] de la [place].   Si tiene un problema urgente y no puede comunicarse con nosotros, puede optar por buscar atencin mdica  en el consultorio de su doctor(a), en una clnica privada, en un  centro de atencin urgente o en una sala de emergencias.  Si tiene Engineer, drilling, por favor llame inmediatamente al 911 o vaya a la sala de emergencias.  Nmeros de bper  - Dr. Gwen Pounds: [phone]  - Dra. Roseanne Reno: [phone]  - Dr. Katrinka Blazing: [phone]   En caso de inclemencias del tiempo, por favor llame a Lacy Duverney principal al [phone] para una actualizacin sobre el [place] de cualquier retraso o cierre.  Consejos para la medicacin en dermatologa: Por favor, guarde las cajas en las que vienen los medicamentos de uso tpico para ayudarle a seguir las instrucciones sobre dnde y cmo usarlos. Las farmacias generalmente imprimen las instrucciones del medicamento slo en las cajas y no directamente en los tubos del [place].   Si su medicamento es muy caro, por favor, pngase en contacto con Rolm Gala llamando al [phone] y presione la opcin 4 o envenos un mensaje a travs de Clinical cytogeneticist.   No podemos decirle cul ser su copago  por los medicamentos por adelantado ya que esto es diferente dependiendo de la cobertura de su seguro. Sin embargo, es posible que podamos encontrar un medicamento sustituto a Audiological scientist un formulario para que el seguro cubra el medicamento que se considera necesario.   Si se requiere una autorizacin previa para que su compaa de seguros [place] su medicamento, por favor permtanos de 1 a 2 das hbiles para completar [address]  Los precios de los medicamentos varan con frecuencia dependiendo del Environmental consultant de dnde se surte la receta y alguna farmacias pueden ofrecer precios ms baratos.  El sitio web www.goodrx.com tiene cupones para medicamentos de Health and safety inspector. Los precios aqu no tienen en cuenta lo que podra costar con la ayuda del seguro (puede ser ms barato con su seguro), pero el sitio web puede darle el precio si no utiliz Tourist information centre manager.  - Puede imprimir el cupn correspondiente y llevarlo con su receta a la farmacia.  - Tambin puede pasar por nuestra oficina durante el horario de atencin regular y Education officer, museum una tarjeta de cupones de GoodRx.  - Si necesita que su receta se enve electrnicamente a una farmacia diferente, informe a nuestra oficina a travs de MyChart de [place] o por telfono llamando al [phone] y presione la opcin 4.

## 2024-03-08 ENCOUNTER — Encounter: Payer: Self-pay | Admitting: Dermatology

## 2024-03-13 DIAGNOSIS — J301 Allergic rhinitis due to pollen: Secondary | ICD-10-CM | POA: Diagnosis not present

## 2024-03-19 DIAGNOSIS — J301 Allergic rhinitis due to pollen: Secondary | ICD-10-CM | POA: Diagnosis not present

## 2024-03-29 ENCOUNTER — Other Ambulatory Visit: Payer: Self-pay | Admitting: Urology

## 2024-04-12 ENCOUNTER — Other Ambulatory Visit: Payer: Self-pay

## 2024-04-12 DIAGNOSIS — E291 Testicular hypofunction: Secondary | ICD-10-CM | POA: Diagnosis not present

## 2024-04-12 DIAGNOSIS — Z125 Encounter for screening for malignant neoplasm of prostate: Secondary | ICD-10-CM | POA: Diagnosis not present

## 2024-04-13 LAB — TESTOSTERONE: Testosterone: 602 ng/dL (ref 264–916)

## 2024-04-13 LAB — HEMOGLOBIN AND HEMATOCRIT, BLOOD
Hematocrit: 55.5 % — ABNORMAL HIGH (ref 37.5–51.0)
Hemoglobin: 17.9 g/dL — ABNORMAL HIGH (ref 13.0–17.7)

## 2024-04-13 LAB — PSA: Prostate Specific Ag, Serum: 1.3 ng/mL (ref 0.0–4.0)

## 2024-04-15 ENCOUNTER — Ambulatory Visit: Payer: Self-pay | Admitting: Urology

## 2024-04-23 ENCOUNTER — Encounter: Payer: Self-pay | Admitting: Oncology

## 2024-04-23 ENCOUNTER — Inpatient Hospital Stay

## 2024-04-23 ENCOUNTER — Inpatient Hospital Stay: Attending: Oncology | Admitting: Oncology

## 2024-04-23 VITALS — BP 143/90 | HR 97 | Temp 97.8°F | Resp 18 | Ht 73.0 in | Wt 202.0 lb

## 2024-04-23 VITALS — BP 120/76 | HR 73 | Resp 18

## 2024-04-23 DIAGNOSIS — D751 Secondary polycythemia: Secondary | ICD-10-CM

## 2024-04-23 DIAGNOSIS — Z87891 Personal history of nicotine dependence: Secondary | ICD-10-CM | POA: Insufficient documentation

## 2024-04-23 NOTE — Patient Instructions (Signed)

## 2024-04-23 NOTE — Progress Notes (Unsigned)
 Patient is doing well

## 2024-04-23 NOTE — Progress Notes (Signed)
 Philip Cordova presents today for phlebotomy per MD orders. Phlebotomy procedure started at 1615 and ended at 1637. 500 mls removed Patient tolerated procedure well. IV needle removed intact.

## 2024-04-23 NOTE — Progress Notes (Unsigned)
 [facility]  Telephone:(336) [phone] Fax:(336) [phone]  ID: Philip Cordova OB: 1965/10/12  MR#: [id number]  RDW#:[id number]  Patient Care Team: Abbey Bruckner, MD as PCP - General (Family Medicine) Jacobo Evalene PARAS, MD as Consulting Physician (Hematology and Oncology)  CHIEF COMPLAINT: Polycythemia.  INTERVAL HISTORY: Patient returns to clinic today for repeat laboratory, further evaluation, and consideration of additional phlebotomy.  He continues to feel well and remains asymptomatic. He has no neurologic complaints.  He denies any recent fevers or illnesses.  He has a good appetite and denies weight loss.  He has no chest pain, shortness of breath, cough, or hemoptysis.  He denies any nausea, vomiting, constipation, or diarrhea.  He has no urinary complaints.  Patient offers no specific complaints today.  REVIEW OF SYSTEMS:   Review of Systems  Constitutional: Negative.  Negative for fever, malaise/fatigue and weight loss.  Respiratory: Negative.  Negative for cough, hemoptysis and shortness of breath.   Cardiovascular: Negative.  Negative for chest pain and leg swelling.  Gastrointestinal: Negative.  Negative for abdominal pain.  Genitourinary: Negative.  Negative for dysuria.  Musculoskeletal: Negative.  Negative for back pain.  Skin: Negative.  Negative for rash.  Neurological: Negative.  Negative for dizziness, focal weakness, weakness and headaches.  Psychiatric/Behavioral: Negative.  The patient is not nervous/anxious.     As per HPI. Otherwise, a complete review of systems is negative.  PAST MEDICAL HISTORY: Past Medical History:  Diagnosis Date  . Asthma   . 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 Chickenpox   . Herpes 12/16/2020  . 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     PAST SURGICAL HISTORY: Past Surgical History:  Procedure Laterality Date  . APPENDECTOMY  2008  . COLONOSCOPY  WITH PROPOFOL  N/A 08/14/2020   Procedure: COLONOSCOPY WITH PROPOFOL ;  Surgeon: Janalyn Keene NOVAK, MD;  Location: [facility] ENDOSCOPY;  Service: Endoscopy;  Laterality: N/A;  . DIAGNOSTIC LARYNGOSCOPY  06/17/2016   Procedure: DIAGNOSTIC LARYNGOSCOPY WITH BIOPSIES;  Surgeon: Carolee Hunter, MD;  Location: [facility] ORS;  Service: ENT;;  . Moles removed  2005  . RHINOPLASTY  1990  . TONSILLECTOMY N/A 06/17/2016   Procedure: TONSILLECTOMY;  Surgeon: Carolee Hunter, MD;  Location: [facility] ORS;  Service: ENT;  Laterality: N/A;  . TONSILLECTOMY      FAMILY HISTORY: Family History  Problem Relation Age of Onset  . Cancer Father        Skin  . Hyperlipidemia Father   . Hypertension Father        Possible  . Diabetes Paternal Uncle     ADVANCED DIRECTIVES (Y/N):  N  HEALTH MAINTENANCE: Social History   Tobacco Use  . Smoking status: Former    Types: Cigarettes  . Smokeless tobacco: Never  . Tobacco comments:    YEARS AGO  Vaping Use  . Vaping status: Never Used  Substance Use Topics  . Alcohol use: No    Comment: Occasionally  . Drug use: No     Colonoscopy:  PAP:  Bone density:  Lipid panel:  Allergies  Allergen Reactions  . Cat Dander Other (See Comments)  . Milk-Related Compounds     Current Outpatient Medications  Medication Sig Dispense Refill  . albuterol  (VENTOLIN  HFA) 108 (90 Base) MCG/ACT inhaler INHALE 1 PUFF BY MOUTH EVERY 6 HOURS AS NEEDED FOR WHEEZING 7 each 0  . chlorhexidine (PERIDEX) 0.12 % solution     .  clotrimazole  (MYCELEX ) 10 MG troche Take 1 tablet (10 mg total) by mouth 5 (five) times daily. X 7- 14 days. Slowly dissolve in mouth. Do not chew or swallow whole. 70 tablet 0  . EPINEPHrine  0.3 mg/0.3 mL IJ SOAJ injection Inject into the muscle. 1 each 0  . fluticasone -salmeterol (ADVAIR) 250-50 MCG/ACT AEPB Inhale 1 puff into the lungs in the morning and at bedtime. 180 each 0  . hydroxychloroquine (PLAQUENIL) 200 MG tablet     . magnesium  30 MG  tablet Take 30 mg by mouth 2 (two) times daily.    . Melatonin 1 MG CAPS Take by mouth.    . mometasone  (ELOCON ) 0.1 % cream Apply 1 Application topically as directed. Qd to bid up to 5 days a week aa groin until clear 50 g 1  . Multiple Vitamins-Minerals (MULTIVITAMIN WITH MINERALS) tablet Take by mouth.    . neomycin -polymyxin-hydrocortisone  (CORTISPORIN) OTIC solution Place 4 drops into both ears 3 (three) times daily. 10 mL 0  . podofilox  (CONDYLOX ) 0.5 % gel Spot treat affected as needed. 3.5 g 2  . testosterone  cypionate (DEPOTESTOSTERONE CYPIONATE) 200 MG/ML injection INJECT 1 ML (CC) INTRAMUSCULARLY ONCE A WEEK 4 mL 2  . valACYclovir  (VALTREX ) 1000 MG tablet Take 1 tablet by mouth once daily 90 tablet 3   No current facility-administered medications for this visit.    OBJECTIVE: Vitals:   04/23/24 1513  BP: (!) 143/90  Pulse: 97  Resp: 18  Temp: 97.8 F (36.6 C)  SpO2: 100%     Body mass index is 26.65 kg/m.    ECOG FS:0 - Asymptomatic  General: Well-developed, well-nourished, no acute distress. Eyes: Pink conjunctiva, anicteric sclera. HEENT: Normocephalic, moist mucous membranes. Lungs: No audible wheezing or coughing. Heart: Regular rate and rhythm. Abdomen: Soft, nontender, no obvious distention. Musculoskeletal: No edema, cyanosis, or clubbing. Neuro: Alert, answering all questions appropriately. Cranial nerves grossly intact. Skin: No rashes or petechiae noted. Psych: Normal affect.  LAB RESULTS:  Lab Results  Component Value Date   NA 138 12/20/2023   K 4.3 12/20/2023   CL 98 12/20/2023   CO2 33 (H) 12/20/2023   GLUCOSE 90 12/20/2023   BUN 20 12/20/2023   CREATININE 1.14 12/20/2023   CALCIUM 9.5 12/20/2023   PROT 7.3 12/20/2023   ALBUMIN 4.3 12/20/2023   AST 24 12/20/2023   ALT 44 12/20/2023   ALKPHOS 66 12/20/2023   BILITOT 0.6 12/20/2023   GFRNONAA >60 09/30/2023   GFRAA >60 03/30/2018    Lab Results  Component Value Date   WBC 9.3  03/05/2024   NEUTROABS 6.6 03/05/2024   HGB 17.9 (H) 04/12/2024   HCT 55.5 (H) 04/12/2024   MCV 97.0 03/05/2024   PLT 297 03/05/2024     STUDIES: No results found.   ASSESSMENT: Polycythemia.  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  Patient's hemoglobin has improved to 15.7.  Previously, all of patient's other laboratory work including iron stores and JAK2 mutation with reflex are negative or within normal limits.  Goal hemoglobin is 17.0.  In the future, patient can likely donate blood at [facility] or [facility].  Patient does not require phlebotomy today.  Return to clinic in 3 months for laboratory work and consideration of phlebotomy.  Patient will then return to clinic in 6 months for further evaluation and continuation of treatment if needed. History of head and neck cancer: Patient underwent chemotherapy and radiation therapy over 6 years ago.  No  further intervention is needed.  He reports he has been discharged from ENT services. Low testosterone : Continue treatment and supplementation per urology.  I spent a total of 20 minutes reviewing chart data, face-to-face evaluation with the patient, counseling and coordination of care as detailed above.   Patient expressed understanding and was in agreement with this plan. He also understands that He can call clinic at any time with any questions, concerns, or complaints.     Evalene JINNY Reusing, MD   04/23/2024 3:48 PM

## 2024-04-24 ENCOUNTER — Encounter: Payer: Self-pay | Admitting: Oncology

## 2024-05-16 ENCOUNTER — Other Ambulatory Visit: Payer: Self-pay

## 2024-05-16 DIAGNOSIS — J45909 Unspecified asthma, uncomplicated: Secondary | ICD-10-CM

## 2024-05-18 ENCOUNTER — Other Ambulatory Visit: Payer: Self-pay | Admitting: *Deleted

## 2024-05-18 DIAGNOSIS — D751 Secondary polycythemia: Secondary | ICD-10-CM

## 2024-05-21 ENCOUNTER — Encounter: Payer: Self-pay | Admitting: Oncology

## 2024-05-21 ENCOUNTER — Inpatient Hospital Stay (HOSPITAL_BASED_OUTPATIENT_CLINIC_OR_DEPARTMENT_OTHER): Admitting: Oncology

## 2024-05-21 ENCOUNTER — Inpatient Hospital Stay

## 2024-05-21 ENCOUNTER — Inpatient Hospital Stay: Attending: Oncology

## 2024-05-21 VITALS — BP 130/88 | HR 91 | Temp 98.3°F | Resp 18 | Ht 73.0 in | Wt 197.0 lb

## 2024-05-21 DIAGNOSIS — D751 Secondary polycythemia: Secondary | ICD-10-CM | POA: Diagnosis not present

## 2024-05-21 DIAGNOSIS — Z79890 Hormone replacement therapy (postmenopausal): Secondary | ICD-10-CM | POA: Insufficient documentation

## 2024-05-21 DIAGNOSIS — Z923 Personal history of irradiation: Secondary | ICD-10-CM | POA: Insufficient documentation

## 2024-05-21 DIAGNOSIS — Z9221 Personal history of antineoplastic chemotherapy: Secondary | ICD-10-CM | POA: Insufficient documentation

## 2024-05-21 DIAGNOSIS — Z8589 Personal history of malignant neoplasm of other organs and systems: Secondary | ICD-10-CM | POA: Insufficient documentation

## 2024-05-21 LAB — CBC WITH DIFFERENTIAL/PLATELET
Abs Immature Granulocytes: 0.04 K/uL (ref 0.00–0.07)
Basophils Absolute: 0.1 K/uL (ref 0.0–0.1)
Basophils Relative: 1 %
Eosinophils Absolute: 0.5 K/uL (ref 0.0–0.5)
Eosinophils Relative: 5 %
HCT: 47.6 % (ref 39.0–52.0)
Hemoglobin: 16.3 g/dL (ref 13.0–17.0)
Immature Granulocytes: 0 %
Lymphocytes Relative: 13 %
Lymphs Abs: 1.3 K/uL (ref 0.7–4.0)
MCH: 33.2 pg (ref 26.0–34.0)
MCHC: 34.2 g/dL (ref 30.0–36.0)
MCV: 96.9 fL (ref 80.0–100.0)
Monocytes Absolute: 1 K/uL (ref 0.1–1.0)
Monocytes Relative: 10 %
Neutro Abs: 6.9 K/uL (ref 1.7–7.7)
Neutrophils Relative %: 71 %
Platelets: 271 K/uL (ref 150–400)
RBC: 4.91 MIL/uL (ref 4.22–5.81)
RDW: 13.8 % (ref 11.5–15.5)
WBC: 9.8 K/uL (ref 4.0–10.5)
nRBC: 0 % (ref 0.0–0.2)

## 2024-05-21 LAB — IRON AND TIBC
Iron: 74 ug/dL (ref 45–182)
Saturation Ratios: 22 % (ref 17.9–39.5)
TIBC: 333 ug/dL (ref 250–450)
UIBC: 259 ug/dL

## 2024-05-21 LAB — FERRITIN: Ferritin: 35 ng/mL (ref 24–336)

## 2024-05-21 NOTE — Progress Notes (Signed)
 [facility]  [phone] [phone]  ID: Philip Cordova OB: 03-22-1966  MR#: [id number]  RDW#:[id number]  Patient Care Team: Abbey Bruckner, MD as PCP - General (Family Medicine) Jacobo Evalene PARAS, MD as Consulting Physician (Oncology)  CHIEF COMPLAINT: Polycythemia.  INTERVAL HISTORY: Patient returns to clinic today for repeat laboratory work, further evaluation, and consideration of additional phlebotomy.  Patient moved his appointment up secondary to insurance purposes.  He continues to feel well and remains asymptomatic. He has no neurologic complaints.  He denies any recent fevers or illnesses.  He has a good appetite and denies weight loss.  He has no chest pain, shortness of breath, cough, or hemoptysis.  He denies any nausea, vomiting, constipation, or diarrhea.  He has no urinary complaints.  Patient offers no specific complaints today.  REVIEW OF SYSTEMS:   Review of Systems  Constitutional: Negative.  Negative for fever, malaise/fatigue and weight loss.  Respiratory: Negative.  Negative for cough, hemoptysis and shortness of breath.   Cardiovascular: Negative.  Negative for chest pain and leg swelling.  Gastrointestinal: Negative.  Negative for abdominal pain.  Genitourinary: Negative.  Negative for dysuria.  Musculoskeletal: Negative.  Negative for back pain.  Skin: Negative.  Negative for rash.  Neurological: Negative.  Negative for dizziness, focal weakness, weakness and headaches.  Psychiatric/Behavioral: Negative.  The patient is not nervous/anxious.     As per HPI. Otherwise, a complete review of systems is negative.  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     PAST SURGICAL HISTORY: Past Surgical History:  Procedure  Laterality Date   APPENDECTOMY  2008   COLONOSCOPY WITH PROPOFOL  N/A 08/14/2020   Procedure: COLONOSCOPY WITH PROPOFOL ;  Surgeon: Janalyn Keene NOVAK, MD;  Location: [facility] ENDOSCOPY;  Service: Endoscopy;  Laterality: N/A;   DIAGNOSTIC LARYNGOSCOPY  06/17/2016   Procedure: DIAGNOSTIC LARYNGOSCOPY WITH BIOPSIES;  Surgeon: Carolee Hunter, MD;  Location: [facility] ORS;  Service: ENT;;   Moles removed  2005   RHINOPLASTY  1990   TONSILLECTOMY N/A 06/17/2016   Procedure: TONSILLECTOMY;  Surgeon: Carolee Hunter, MD;  Location: [facility] ORS;  Service: ENT;  Laterality: N/A;   TONSILLECTOMY      FAMILY HISTORY: Family History  Problem Relation Age of Onset   Cancer Father        Skin   Hyperlipidemia Father    Hypertension Father        Possible   Diabetes Paternal Uncle     ADVANCED DIRECTIVES (Y/N):  N  HEALTH MAINTENANCE: Social History   Tobacco Use   Smoking status: Former    Types: Cigarettes   Smokeless tobacco: Never   Tobacco comments:    YEARS AGO  Vaping Use   Vaping status: Never Used  Substance Use Topics   Alcohol use: No    Comment: Occasionally   Drug use: No     Colonoscopy:  PAP:  Bone density:  Lipid panel:  Allergies  Allergen Reactions   Cat Dander Other (See Comments)   Milk-Related Compounds     Current Outpatient Medications  Medication Sig Dispense Refill   ADVAIR DISKUS 250-50 MCG/ACT AEPB INHALE 1 DOSE BY MOUTH IN THE MORNING AND AT BEDTIME 180 each 1   albuterol  (VENTOLIN  HFA)  108 (90 Base) MCG/ACT inhaler INHALE 1 PUFF BY MOUTH EVERY 6 HOURS AS NEEDED FOR WHEEZING 7 each 0   chlorhexidine (PERIDEX) 0.12 % solution      clotrimazole  (MYCELEX ) 10 MG troche Take 1 tablet (10 mg total) by mouth 5 (five) times daily. X 7- 14 days. Slowly dissolve in mouth. Do not chew or swallow whole. 70 tablet 0   EPINEPHrine  0.3 mg/0.3 mL IJ SOAJ injection Inject into the muscle. 1 each 0   hydroxychloroquine (PLAQUENIL) 200 MG tablet      magnesium  30 MG  tablet Take 30 mg by mouth 2 (two) times daily.     Melatonin 1 MG CAPS Take by mouth.     mometasone  (ELOCON ) 0.1 % cream Apply 1 Application topically as directed. Qd to bid up to 5 days a week aa groin until clear 50 g 1   Multiple Vitamins-Minerals (MULTIVITAMIN WITH MINERALS) tablet Take by mouth.     neomycin -polymyxin-hydrocortisone  (CORTISPORIN) OTIC solution Place 4 drops into both ears 3 (three) times daily. 10 mL 0   podofilox  (CONDYLOX ) 0.5 % gel Spot treat affected as needed. 3.5 g 2   testosterone  cypionate (DEPOTESTOSTERONE CYPIONATE) 200 MG/ML injection INJECT 1 ML (CC) INTRAMUSCULARLY ONCE A WEEK 4 mL 2   valACYclovir  (VALTREX ) 1000 MG tablet Take 1 tablet by mouth once daily (Patient not taking: Reported on 05/21/2024) 90 tablet 3   No current facility-administered medications for this visit.    OBJECTIVE: Vitals:   05/21/24 1311  BP: 130/88  Pulse: 91  Resp: 18  Temp: 98.3 F (36.8 C)  SpO2: 97%     Body mass index is 25.99 kg/m.    ECOG FS:0 - Asymptomatic  General: Well-developed, well-nourished, no acute distress. Eyes: Pink conjunctiva, anicteric sclera. HEENT: Normocephalic, moist mucous membranes. Lungs: No audible wheezing or coughing. Heart: Regular rate and rhythm. Abdomen: Soft, nontender, no obvious distention. Musculoskeletal: No edema, cyanosis, or clubbing. Neuro: Alert, answering all questions appropriately. Cranial nerves grossly intact. Skin: No rashes or petechiae noted. Psych: Normal affect.   LAB RESULTS:  Lab Results  Component Value Date   NA 138 12/20/2023   K 4.3 12/20/2023   CL 98 12/20/2023   CO2 33 (H) 12/20/2023   GLUCOSE 90 12/20/2023   BUN 20 12/20/2023   CREATININE 1.14 12/20/2023   CALCIUM 9.5 12/20/2023   PROT 7.3 12/20/2023   ALBUMIN 4.3 12/20/2023   AST 24 12/20/2023   ALT 44 12/20/2023   ALKPHOS 66 12/20/2023   BILITOT 0.6 12/20/2023   GFRNONAA >60 09/30/2023   GFRAA >60 03/30/2018    Lab Results   Component Value Date   WBC 9.8 05/21/2024   NEUTROABS 6.9 05/21/2024   HGB 16.3 05/21/2024   HCT 47.6 05/21/2024   MCV 96.9 05/21/2024   PLT 271 05/21/2024     STUDIES: No results found.   ASSESSMENT: Polycythemia.  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  Patient's hemoglobin has improved to 16.3.  Previously, all of patient's other laboratory work including iron stores and JAK2 mutation with reflex are negative or within normal limits.  Goal hemoglobin is 17.0.  He does not require additional phlebotomy today.  Patient last received 500 mL phlebotomy on April 12, 2024.  Return to clinic as previously scheduled in November 2025 for repeat laboratory, further evaluation, and consideration of additional phlebotomy if necessary.   History of head and neck cancer: Patient underwent chemotherapy and radiation therapy over 6 years ago.  No further intervention is needed.  He reports he has been discharged from ENT services. Low testosterone : Continue treatment and supplementation per urology.  I spent a total of 20 minutes reviewing chart data, face-to-face evaluation with the patient, counseling and coordination of care as detailed above.    Patient expressed understanding and was in agreement with this plan. He also understands that He can call clinic at any time with any questions, concerns, or complaints.     Evalene JINNY Reusing, MD   05/21/2024 1:18 PM

## 2024-05-21 NOTE — Progress Notes (Signed)
No phlebotomy today.

## 2024-05-21 NOTE — Progress Notes (Signed)
 Patient is doing good,

## 2024-06-05 ENCOUNTER — Ambulatory Visit: Admitting: Oncology

## 2024-06-05 ENCOUNTER — Encounter

## 2024-06-05 ENCOUNTER — Other Ambulatory Visit

## 2024-06-08 DIAGNOSIS — J301 Allergic rhinitis due to pollen: Secondary | ICD-10-CM | POA: Diagnosis not present

## 2024-06-18 ENCOUNTER — Encounter: Payer: Self-pay | Admitting: Oncology

## 2024-06-18 ENCOUNTER — Ambulatory Visit

## 2024-06-20 ENCOUNTER — Ambulatory Visit: Admitting: Nurse Practitioner

## 2024-06-25 ENCOUNTER — Ambulatory Visit

## 2024-06-25 VITALS — BP 131/77 | HR 75 | Temp 98.5°F | Ht 73.0 in | Wt 197.4 lb

## 2024-06-25 DIAGNOSIS — E291 Testicular hypofunction: Secondary | ICD-10-CM

## 2024-06-25 DIAGNOSIS — G4700 Insomnia, unspecified: Secondary | ICD-10-CM | POA: Diagnosis not present

## 2024-06-25 DIAGNOSIS — R030 Elevated blood-pressure reading, without diagnosis of hypertension: Secondary | ICD-10-CM | POA: Insufficient documentation

## 2024-06-25 DIAGNOSIS — A6000 Herpesviral infection of urogenital system, unspecified: Secondary | ICD-10-CM

## 2024-06-25 DIAGNOSIS — H60543 Acute eczematoid otitis externa, bilateral: Secondary | ICD-10-CM | POA: Diagnosis not present

## 2024-06-25 DIAGNOSIS — D751 Secondary polycythemia: Secondary | ICD-10-CM

## 2024-06-25 DIAGNOSIS — J4520 Mild intermittent asthma, uncomplicated: Secondary | ICD-10-CM

## 2024-06-25 DIAGNOSIS — M310 Hypersensitivity angiitis: Secondary | ICD-10-CM | POA: Diagnosis not present

## 2024-06-25 MED ORDER — VALACYCLOVIR HCL 1 G PO TABS: ORAL_TABLET | ORAL | 3 refills | Status: AC

## 2024-06-25 NOTE — Assessment & Plan Note (Signed)
 Chronic with recurrent flare up on Plaquenil.  This is managed through rheumatology.  Patient is also established with ophthalmology and gets regular eye exam.

## 2024-06-25 NOTE — Assessment & Plan Note (Signed)
 Chronic with intermittent flareup.  Patient is self titrating off from Advair due to potential oral thrush.  Uses as needed albuterol  inhaler.  I recommend patient reach out to us  if he develops symptoms suggestive of asthma exacerbation including wheezing, cough, shortness of breath.  Recommend repeating pulmonary function test for further evaluation if symptoms were to reoccur.

## 2024-06-25 NOTE — Assessment & Plan Note (Signed)
 Per 2025 AHA/ACC guideline goal BP <130/80 mmHg. SBP slightly elevated on arrival, however patient had just walked before checking his BP today. Recommend home BP monitoring, keeping a BP log and updating us  in about a month through mychart on home BP reading.

## 2024-06-25 NOTE — Assessment & Plan Note (Signed)
 Chronic with recurrent flare-up. On as needed Cortisporin ear drops when he develops ear discharge. Encourage reaching out for appointment if symptoms worsens.

## 2024-06-25 NOTE — Assessment & Plan Note (Signed)
 Suspected secondary to use of testosterone .  Hemoglobin has improved with reduced doses of testosterone  and phlebotomy.  Continue follow-up with urology and hematology for management of hypogonadism and polycythemia.

## 2024-06-25 NOTE — Progress Notes (Signed)
 Established Patient Office Visit   Subjective  Patient ID: Philip Cordova, male    DOB: 1965/10/29  Age: 58 y.o. MRN: [id number]  Chief Complaint  Patient presents with   Medical Management of Chronic Issues    He  has a past medical history of Asthma, Athlete's heart, Chickenpox, Elevated TSH (02/23/2021), Herpes (12/16/2020), Hypersomnia (02/02/2023), Squamous cell carcinoma of head and neck (04/09/2016), and Thrush (10/07/2023).  HPI Discussed the use of AI scribe software for clinical note transcription with the patient, who gave verbal consent to proceed.  History of Present Illness Philip Cordova is a 58 year old male presenting for chronic medication management.    Patient has a history of asthma and has been using Advair which has potentially contributed to oral thrush.  His thrush, which persisted for three and a half months, has resolved. During this period, he experienced overall discomfort.currently he was trying to reduce use of Advair.  He recalls having pulmonary function test done few years ago.  Also uses rescue albuterol  inhaler as needed.  Denies wheezing, cough.    He takes Valtrex  1000 mg daily for genital herpetic lesion suppressive therapy, which has effectively prevented outbreaks. Previously, he experienced weekly outbreaks when not on medication. He is frustrated with insurance limitations on prescription refills.  He has a history of eczema in his ears, leading to frequent ear infections. He manages this with antibiotic ear drops as needed and a home remedy of vinegar and rubbing alcohol. He also uses melatonin and magnesium  nightly to improve sleep quality, noting significant improvements with magnesium .  He is on testosterone  therapy which contributed to polycythemia.  He is currently established with urology who manages testosterone  therapy and hematology who manages his polycythemia.  Polycythemia is suspected secondary to use of testosterone  therapy.  Currently  with reduced dose of testosterone , phlebotomy he has noticed improvement in hemoglobin level.  He donates blood regularly to manage polycythemia secondary to testosterone  use.  He experiences sleep disturbances, typically sleeping from 2 AM to 6-7 AM, then napping later in the morning. He does not snore and attributes his sleep issues to his lifestyle.  He is currently taking magnesium  and melatonin at bedtime to help improve sleep.  He does not have history of sleep apnea he is interested to see a sleep specialist next year due to insurance reasons.  He is currently on hydroxychloroquine 200 mg daily for skin lesions.  ROS As per HPI    Objective:     BP 131/77   Pulse 75   Temp 98.5 F (36.9 C)   Ht 6' 1 (1.854 m)   Wt 197 lb 6.4 oz (89.5 kg)   SpO2 97%   BMI 26.04 kg/m      06/25/2024    4:20 PM 12/20/2023    3:16 PM 02/02/2023   11:50 AM  Depression screen PHQ 2/9  Decreased Interest 0 0 0  Down, Depressed, Hopeless 0 0 0  PHQ - 2 Score 0 0 0  Altered sleeping 3 0 0  Tired, decreased energy 0 0 0  Change in appetite 0 0 0  Feeling bad or failure about yourself  0 0 0  Trouble concentrating 0 0 0  Moving slowly or fidgety/restless 0 0 0  Suicidal thoughts 0 0 0  PHQ-9 Score 3 0 0  Difficult doing work/chores Not difficult at all  Not difficult at all      06/25/2024    4:20 PM 12/20/2023  3:16 PM 02/02/2023   11:50 AM 11/19/2022    4:42 PM  GAD 7 : Generalized Anxiety Score  Nervous, Anxious, on Edge 0 0 0 0  Control/stop worrying 0 0 0 0  Worry too much - different things 0 0 0 0  Trouble relaxing 0 0 0 0  Restless 0 0 0 0  Easily annoyed or irritable 0 0 0 0  Afraid - awful might happen 0 0 0 0  Total GAD 7 Score 0 0 0 0  Anxiety Difficulty Not difficult at all  Not difficult at all Not difficult at all    Physical Exam Constitutional:      Appearance: Normal appearance.  HENT:     Head: Normocephalic and atraumatic.     Right Ear: Tympanic membrane  normal. There is no impacted cerumen.     Left Ear: Tympanic membrane normal. There is no impacted cerumen.     Ears:     Comments: Left external ear: dry, flaky, and desquamating skin. No discharge.     Mouth/Throat:     Mouth: Mucous membranes are moist.  Cardiovascular:     Rate and Rhythm: Normal rate.     Pulses: Normal pulses.  Pulmonary:     Effort: Pulmonary effort is normal.     Breath sounds: Normal breath sounds. No wheezing.  Abdominal:     General: Bowel sounds are normal.     Palpations: Abdomen is soft.     Tenderness: There is no guarding.  Musculoskeletal:     Cervical back: Normal range of motion and neck supple.     Right lower leg: No edema.     Left lower leg: No edema.  Skin:    General: Skin is warm.  Neurological:     Mental Status: He is alert and oriented to person, place, and time.  Psychiatric:        Mood and Affect: Mood normal.      No results found for any visits on 06/25/24.  The 10-year ASCVD risk score (Arnett DK, et al., 2019) is: 7.2%    Assessment & Plan:  Herpes simplex infection of genitourinary system Assessment & Plan: Chronic intermittent issue. Symptoms flare-up has significantly reduced with daily Valtrex  1000 mg for suppression. Continue Valtrex  1000 mg daily. Refill sent.   Orders: -     valACYclovir  HCl; Take 1 tablet by mouth once daily for suppression  Dispense: 90 tablet; Refill: 3  Leukocytoclastic vasculitis (HCC) Assessment & Plan: Chronic with recurrent flare up on Plaquenil.  This is managed through rheumatology.  Patient is also established with ophthalmology and gets regular eye exam.     Polycythemia Assessment & Plan: Suspected secondary to use of testosterone .  Hemoglobin has improved with reduced doses of testosterone  and phlebotomy.  Continue follow-up with urology and hematology for management of hypogonadism and polycythemia.   Testicular hypergonadotropic hypogonadism Assessment & Plan: This is  chronic and improved with use of Testosterone  therapy which is managed by urology, continue f/u with urology.    Mild intermittent asthma without complication Assessment & Plan: Chronic with intermittent flareup.  Patient is self titrating off from Advair due to potential oral thrush.  Uses as needed albuterol  inhaler.  I recommend patient reach out to us  if he develops symptoms suggestive of asthma exacerbation including wheezing, cough, shortness of breath.  Recommend repeating pulmonary function test for further evaluation if symptoms were to reoccur.    Elevated blood pressure reading Assessment & Plan:  Per 2025 AHA/ACC guideline goal BP <130/80 mmHg. SBP slightly elevated on arrival, however patient had just walked before checking his BP today. Recommend home BP monitoring, keeping a BP log and updating us  in about a month through mychart on home BP reading.    Eczema of external ear, bilateral Assessment & Plan: Chronic with recurrent flare-up. On as needed Cortisporin ear drops when he develops ear discharge. Encourage reaching out for appointment if symptoms worsens.    Insomnia, unspecified type Assessment & Plan: Chronic insomnia (sleep maintenance insomnia) managed with melatonin and magnesium . Considering sleep study post-insurance change starting 2026.      Return in about 6 months (around 12/24/2024) for Chronic follow up .  Consider referral to sleep clinic.   Luke Shade, MD

## 2024-06-25 NOTE — Assessment & Plan Note (Signed)
 Chronic insomnia (sleep maintenance insomnia) managed with melatonin and magnesium . Considering sleep study post-insurance change starting 2026.

## 2024-06-25 NOTE — Assessment & Plan Note (Addendum)
 Chronic intermittent issue. Symptoms flare-up has significantly reduced with daily Valtrex  1000 mg for suppression. Continue Valtrex  1000 mg daily. Refill sent.

## 2024-06-25 NOTE — Assessment & Plan Note (Signed)
 This is chronic and improved with use of Testosterone  therapy which is managed by urology, continue f/u with urology.

## 2024-07-05 DIAGNOSIS — Z85818 Personal history of malignant neoplasm of other sites of lip, oral cavity, and pharynx: Secondary | ICD-10-CM | POA: Diagnosis not present

## 2024-07-05 DIAGNOSIS — J301 Allergic rhinitis due to pollen: Secondary | ICD-10-CM | POA: Diagnosis not present

## 2024-07-05 DIAGNOSIS — Z08 Encounter for follow-up examination after completed treatment for malignant neoplasm: Secondary | ICD-10-CM | POA: Diagnosis not present

## 2024-07-05 DIAGNOSIS — H6123 Impacted cerumen, bilateral: Secondary | ICD-10-CM | POA: Diagnosis not present

## 2024-07-05 DIAGNOSIS — R1314 Dysphagia, pharyngoesophageal phase: Secondary | ICD-10-CM | POA: Diagnosis not present

## 2024-07-06 ENCOUNTER — Other Ambulatory Visit: Payer: Self-pay | Admitting: Nurse Practitioner

## 2024-07-06 DIAGNOSIS — B370 Candidal stomatitis: Secondary | ICD-10-CM

## 2024-07-06 DIAGNOSIS — Z79899 Other long term (current) drug therapy: Secondary | ICD-10-CM | POA: Diagnosis not present

## 2024-07-21 ENCOUNTER — Other Ambulatory Visit: Payer: Self-pay | Admitting: Dermatology

## 2024-07-21 DIAGNOSIS — N481 Balanitis: Secondary | ICD-10-CM

## 2024-07-24 ENCOUNTER — Other Ambulatory Visit

## 2024-07-24 ENCOUNTER — Ambulatory Visit: Admitting: Oncology

## 2024-07-24 ENCOUNTER — Encounter

## 2024-08-01 ENCOUNTER — Ambulatory Visit: Payer: 59 | Admitting: Dermatology

## 2024-08-06 ENCOUNTER — Inpatient Hospital Stay: Payer: Self-pay | Attending: Oncology

## 2024-08-06 ENCOUNTER — Inpatient Hospital Stay: Payer: Self-pay | Admitting: Oncology

## 2024-08-06 ENCOUNTER — Inpatient Hospital Stay: Payer: Self-pay

## 2024-08-14 ENCOUNTER — Ambulatory Visit: Admitting: Nurse Practitioner

## 2024-08-14 ENCOUNTER — Encounter: Payer: Self-pay | Admitting: Nurse Practitioner

## 2024-08-14 VITALS — BP 120/70 | HR 78 | Temp 97.8°F | Ht 73.0 in | Wt 195.2 lb

## 2024-08-14 DIAGNOSIS — M545 Low back pain, unspecified: Secondary | ICD-10-CM | POA: Insufficient documentation

## 2024-08-14 NOTE — Progress Notes (Signed)
 Leron Glance, NP-C Phone: [phone]  Philip Cordova is a 58 y.o. male who presents today for back pain.   Discussed the use of AI scribe software for clinical note transcription with the patient, who gave verbal consent to proceed.  History of Present Illness   Philip Cordova is a 58 year old male with a history of oral cancer who presents with severe back pain.  He experiences severe back pain, described as unlike any pain he has had before. The pain started bilaterally but has since localized to the left side, particularly along the path of the ureters. It began two weeks ago and has intensified, impacting his ability to exercise and perform daily activities such as walking his dog. He describes the pain as 'triangular' and notes it has moved from a higher position, flank, to the lower back. He has tried heat pads and magnesium  baths for relief, but the pain persists.  No urinary symptoms such as hematuria, nausea, dizziness, or changes in urination. He maintains a healthy lifestyle, avoiding alcohol, smoking, caffeine, and processed foods. He consumes a gallon of fluids daily, primarily herbal tea, and follows a strict diet.  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 He manages his health independently, without assistance from family or friends.  He expresses concern about the cost of potential diagnostic imaging due to his current catastrophic insurance plan, which will change in January. He is hesitant to undergo expensive procedures unless necessary.      Tobacco Use History[1]  Medications Ordered Prior to Encounter[2]   ROS see history of present illness  Objective  Physical Exam Vitals:   08/14/24 1115  BP: 120/70  Pulse: 78  Temp: 97.8 F (36.6 C)  SpO2: 95%    BP Readings from Last 3 Encounters:  08/14/24 120/70  06/25/24 131/77  05/21/24  130/88   Wt Readings from Last 3 Encounters:  08/14/24 195 lb 3.2 oz (88.5 kg)  06/25/24 197 lb 6.4 oz (89.5 kg)  05/21/24 197 lb (89.4 kg)    Physical Exam Constitutional:      General: He is not in acute distress.    Appearance: Normal appearance.  HENT:     Head: Normocephalic.  Cardiovascular:     Rate and Rhythm: Normal rate and regular rhythm.     Heart sounds: Normal heart sounds.  Pulmonary:     Effort: Pulmonary effort is normal.     Breath sounds: Normal breath sounds.  Musculoskeletal:     Lumbar back: Tenderness present. Decreased range of motion.       Back:  Skin:    General: Skin is warm and dry.  Neurological:     General: No focal deficit present.     Mental Status: He is alert.  Psychiatric:        Mood and Affect: Mood normal.        Behavior: Behavior normal.      Assessment/Plan: Please see individual problem list.  Acute left-sided low back pain without sciatica Assessment & Plan: Low back pain, mainly on the left side, may indicate sacroiliitis, with nephrolithiasis as a differential diagnosis. Initially started as flank pain and has moved downward. Pain increases with lifting and activity. Insurance issues complicate obtaining a CT scan due to a high deductible. He was unable to leave a urine while in office, he will return to complete this. A CT renal  stone study is ordered to assess nephrolithiasis. Rest and avoidance of lifting are advised. Advised OTC conservative management, Tylenol , Ibuprofen and topical pain relief ointments as needed. Further work up pending. Return precautions given to patient.   Orders: -     Urinalysis, Routine w reflex microscopic; Future -     Urine Culture; Future -     CT RENAL STONE STUDY; Future     Return if symptoms worsen or fail to improve.   Leron Glance, NP-C [facility] [place]     [1]  Social History Tobacco Use  Smoking Status Former   Types: Cigarettes  Smokeless  Tobacco Never  Tobacco Comments   YEARS AGO  [2]  Current Outpatient Medications on File Prior to Visit  Medication Sig Dispense Refill   ADVAIR DISKUS 250-50 MCG/ACT AEPB INHALE 1 DOSE BY MOUTH IN THE MORNING AND AT BEDTIME (Patient not taking: Reported on 06/25/2024) 180 each 1   albuterol  (VENTOLIN  HFA) 108 (90 Base) MCG/ACT inhaler INHALE 1 PUFF BY MOUTH EVERY 6 HOURS AS NEEDED FOR WHEEZING 7 each 0   chlorhexidine (PERIDEX) 0.12 % solution      EPINEPHrine  0.3 mg/0.3 mL IJ SOAJ injection Inject into the muscle. 1 each 0   hydroxychloroquine (PLAQUENIL) 200 MG tablet      magnesium  30 MG tablet Take 30 mg by mouth 2 (two) times daily.     Melatonin 1 MG CAPS Take by mouth.     mometasone  (ELOCON ) 0.1 % cream APPLY TOPICALLY AS DIRECTED ONCE TO TWICE DAILY UP TO FIVE DAYS PER WEEK TO THE AFFECTED AREAS OF GROIN UNTIL CLEAR 45 g 0   Multiple Vitamins-Minerals (MULTIVITAMIN WITH MINERALS) tablet Take by mouth.     neomycin -polymyxin-hydrocortisone  (CORTISPORIN) OTIC solution Place 4 drops into both ears 3 (three) times daily. 10 mL 0   podofilox  (CONDYLOX ) 0.5 % gel Spot treat affected as needed. 3.5 g 2   testosterone  cypionate (DEPOTESTOSTERONE CYPIONATE) 200 MG/ML injection INJECT 1 ML (CC) INTRAMUSCULARLY ONCE A WEEK 4 mL 2   valACYclovir  (VALTREX ) 1000 MG tablet Take 1 tablet by mouth once daily for suppression 90 tablet 3   No current facility-administered medications on file prior to visit.

## 2024-08-14 NOTE — Assessment & Plan Note (Addendum)
 Low back pain, mainly on the left side, may indicate sacroiliitis, with nephrolithiasis as a differential diagnosis. Initially started as flank pain and has moved downward. Pain increases with lifting and activity. Insurance issues complicate obtaining a CT scan due to a high deductible. He was unable to leave a urine while in office, he will return to complete this. A CT renal stone study is ordered to assess nephrolithiasis. Rest and avoidance of lifting are advised. Advised OTC conservative management, Tylenol , Ibuprofen and topical pain relief ointments as needed. Further work up pending. Return precautions given to patient.

## 2024-08-15 LAB — URINALYSIS, ROUTINE W REFLEX MICROSCOPIC
Bilirubin Urine: NEGATIVE
Hgb urine dipstick: NEGATIVE
Leukocytes,Ua: NEGATIVE
Nitrite: NEGATIVE
RBC / HPF: NONE SEEN (ref 0–?)
Specific Gravity, Urine: 1.025 (ref 1.000–1.030)
Total Protein, Urine: NEGATIVE
Urine Glucose: NEGATIVE
Urobilinogen, UA: 0.2 (ref 0.0–1.0)
WBC, UA: NONE SEEN (ref 0–?)
pH: 6 (ref 5.0–8.0)

## 2024-08-15 LAB — URINE CULTURE
MICRO NUMBER:: 17362207
SPECIMEN QUALITY:: ADEQUATE

## 2024-08-21 ENCOUNTER — Ambulatory Visit: Payer: Self-pay | Admitting: Nurse Practitioner

## 2024-08-31 ENCOUNTER — Other Ambulatory Visit: Payer: Self-pay | Admitting: Urology

## 2024-09-06 ENCOUNTER — Other Ambulatory Visit: Payer: Self-pay | Admitting: Urology

## 2024-09-12 ENCOUNTER — Encounter: Payer: Self-pay | Admitting: Oncology

## 2024-09-13 ENCOUNTER — Encounter: Payer: Self-pay | Admitting: Oncology

## 2024-09-18 ENCOUNTER — Telehealth: Payer: Self-pay

## 2024-09-18 NOTE — Telephone Encounter (Signed)
 Per [place], PA not found. She will look into it and contact the pt.

## 2024-09-18 NOTE — Telephone Encounter (Signed)
 Pt called the triage line about PA for testosterone . Informed pt that Pa has been sent to insurance, we are waiting on response. Pt voiced understanding

## 2024-09-26 NOTE — Telephone Encounter (Signed)
 Patient called to follow up on progress with Prior Authorization for testosterone . He said he hasn't heard anything since last week, and would like a call to update.

## 2024-09-26 NOTE — Telephone Encounter (Addendum)
Patient is paying out of pocket

## 2024-10-11 ENCOUNTER — Other Ambulatory Visit

## 2024-10-16 ENCOUNTER — Ambulatory Visit: Admitting: Urology

## 2024-12-11 ENCOUNTER — Ambulatory Visit: Admitting: Dermatology

## 2024-12-25 ENCOUNTER — Ambulatory Visit
